# Patient Record
Sex: Male | Born: 1940 | Race: White | Hispanic: No | Marital: Married | Smoking: Former smoker
Health system: Southern US, Community
[De-identification: ages and names within clinical notes are randomized; demographics above are authoritative.]

## PROBLEM LIST (undated history)

## (undated) DIAGNOSIS — R9430 Abnormal result of cardiovascular function study, unspecified: Secondary | ICD-10-CM

## (undated) DIAGNOSIS — E785 Hyperlipidemia, unspecified: Secondary | ICD-10-CM

## (undated) DIAGNOSIS — I10 Essential (primary) hypertension: Secondary | ICD-10-CM

## (undated) DIAGNOSIS — I4891 Unspecified atrial fibrillation: Secondary | ICD-10-CM

## (undated) DIAGNOSIS — Q2111 Secundum atrial septal defect: Secondary | ICD-10-CM

## (undated) DIAGNOSIS — G8929 Other chronic pain: Secondary | ICD-10-CM

## (undated) DIAGNOSIS — R0789 Other chest pain: Secondary | ICD-10-CM

## (undated) DIAGNOSIS — I779 Disorder of arteries and arterioles, unspecified: Secondary | ICD-10-CM

## (undated) DIAGNOSIS — I350 Nonrheumatic aortic (valve) stenosis: Secondary | ICD-10-CM

## (undated) DIAGNOSIS — I679 Cerebrovascular disease, unspecified: Secondary | ICD-10-CM

## (undated) DIAGNOSIS — R200 Anesthesia of skin: Secondary | ICD-10-CM

## (undated) DIAGNOSIS — I739 Peripheral vascular disease, unspecified: Secondary | ICD-10-CM

## (undated) DIAGNOSIS — E119 Type 2 diabetes mellitus without complications: Secondary | ICD-10-CM

## (undated) DIAGNOSIS — Q211 Atrial septal defect: Secondary | ICD-10-CM

## (undated) DIAGNOSIS — I340 Nonrheumatic mitral (valve) insufficiency: Secondary | ICD-10-CM

## (undated) DIAGNOSIS — I2510 Atherosclerotic heart disease of native coronary artery without angina pectoris: Secondary | ICD-10-CM

## (undated) DIAGNOSIS — M549 Dorsalgia, unspecified: Secondary | ICD-10-CM

## (undated) DIAGNOSIS — R202 Paresthesia of skin: Secondary | ICD-10-CM

## (undated) DIAGNOSIS — Z952 Presence of prosthetic heart valve: Secondary | ICD-10-CM

## (undated) DIAGNOSIS — R269 Unspecified abnormalities of gait and mobility: Secondary | ICD-10-CM

## (undated) DIAGNOSIS — N200 Calculus of kidney: Secondary | ICD-10-CM

## (undated) DIAGNOSIS — Z951 Presence of aortocoronary bypass graft: Secondary | ICD-10-CM

## (undated) HISTORY — DX: Type 2 diabetes mellitus without complications: E11.9

## (undated) HISTORY — DX: Hyperlipidemia, unspecified: E78.5

## (undated) HISTORY — DX: Other chest pain: R07.89

## (undated) HISTORY — DX: Atrial septal defect: Q21.1

## (undated) HISTORY — DX: Essential (primary) hypertension: I10

## (undated) HISTORY — PX: NOSE SURGERY: SHX723

## (undated) HISTORY — DX: Atherosclerotic heart disease of native coronary artery without angina pectoris: I25.10

## (undated) HISTORY — DX: Cerebrovascular disease, unspecified: I67.9

## (undated) HISTORY — DX: Secundum atrial septal defect: Q21.11

## (undated) HISTORY — PX: VASECTOMY: SHX75

## (undated) HISTORY — DX: Disorder of arteries and arterioles, unspecified: I77.9

## (undated) HISTORY — DX: Presence of prosthetic heart valve: Z95.2

## (undated) HISTORY — DX: Nonrheumatic mitral (valve) insufficiency: I34.0

## (undated) HISTORY — DX: Presence of aortocoronary bypass graft: Z95.1

## (undated) HISTORY — DX: Abnormal result of cardiovascular function study, unspecified: R94.30

## (undated) HISTORY — DX: Peripheral vascular disease, unspecified: I73.9

## (undated) HISTORY — DX: Dorsalgia, unspecified: M54.9

## (undated) HISTORY — DX: Nonrheumatic aortic (valve) stenosis: I35.0

## (undated) HISTORY — DX: Unspecified atrial fibrillation: I48.91

## (undated) HISTORY — DX: Calculus of kidney: N20.0

## (undated) HISTORY — DX: Unspecified abnormalities of gait and mobility: R26.9

## (undated) HISTORY — PX: CHOLECYSTECTOMY: SHX55

## (undated) HISTORY — DX: Paresthesia of skin: R20.2

## (undated) HISTORY — DX: Anesthesia of skin: R20.0

## (undated) HISTORY — DX: Other chronic pain: G89.29

---

## 1998-11-26 ENCOUNTER — Encounter: Payer: Self-pay | Admitting: Cardiology

## 2000-09-04 HISTORY — PX: INGUINAL HERNIA REPAIR: SUR1180

## 2005-04-28 ENCOUNTER — Ambulatory Visit: Payer: Self-pay | Admitting: Cardiology

## 2005-05-09 ENCOUNTER — Ambulatory Visit: Payer: Self-pay | Admitting: Cardiology

## 2005-05-16 ENCOUNTER — Ambulatory Visit: Payer: Self-pay | Admitting: Cardiology

## 2005-05-25 ENCOUNTER — Ambulatory Visit: Payer: Self-pay | Admitting: Cardiology

## 2005-05-25 ENCOUNTER — Encounter: Payer: Self-pay | Admitting: Cardiology

## 2005-05-25 ENCOUNTER — Inpatient Hospital Stay (HOSPITAL_BASED_OUTPATIENT_CLINIC_OR_DEPARTMENT_OTHER): Admission: RE | Admit: 2005-05-25 | Discharge: 2005-05-25 | Payer: Self-pay | Admitting: Cardiology

## 2005-06-01 ENCOUNTER — Ambulatory Visit: Payer: Self-pay | Admitting: Cardiology

## 2005-06-02 ENCOUNTER — Encounter: Payer: Self-pay | Admitting: Cardiology

## 2005-06-02 ENCOUNTER — Ambulatory Visit: Payer: Self-pay | Admitting: Cardiology

## 2005-06-04 HISTORY — PX: AORTIC VALVE REPLACEMENT: SHX41

## 2005-06-04 HISTORY — PX: CORONARY ARTERY BYPASS GRAFT: SHX141

## 2005-06-08 ENCOUNTER — Encounter
Admission: RE | Admit: 2005-06-08 | Discharge: 2005-06-08 | Payer: Self-pay | Admitting: Thoracic Surgery (Cardiothoracic Vascular Surgery)

## 2005-06-09 ENCOUNTER — Ambulatory Visit: Payer: Self-pay | Admitting: Dentistry

## 2005-06-09 ENCOUNTER — Encounter
Admission: RE | Admit: 2005-06-09 | Discharge: 2005-06-09 | Payer: Self-pay | Admitting: Thoracic Surgery (Cardiothoracic Vascular Surgery)

## 2005-06-13 ENCOUNTER — Encounter: Payer: Self-pay | Admitting: Cardiology

## 2005-06-13 ENCOUNTER — Inpatient Hospital Stay (HOSPITAL_COMMUNITY)
Admission: RE | Admit: 2005-06-13 | Discharge: 2005-06-20 | Payer: Self-pay | Admitting: Thoracic Surgery (Cardiothoracic Vascular Surgery)

## 2005-06-13 ENCOUNTER — Encounter (INDEPENDENT_AMBULATORY_CARE_PROVIDER_SITE_OTHER): Payer: Self-pay | Admitting: Specialist

## 2005-06-20 ENCOUNTER — Encounter: Payer: Self-pay | Admitting: Cardiology

## 2005-06-22 ENCOUNTER — Ambulatory Visit: Payer: Self-pay | Admitting: Cardiology

## 2005-06-26 ENCOUNTER — Ambulatory Visit: Payer: Self-pay | Admitting: Cardiology

## 2005-06-27 ENCOUNTER — Ambulatory Visit: Payer: Self-pay | Admitting: Cardiology

## 2005-06-28 ENCOUNTER — Ambulatory Visit: Payer: Self-pay | Admitting: Cardiology

## 2005-06-29 ENCOUNTER — Ambulatory Visit: Payer: Self-pay | Admitting: Cardiology

## 2005-07-06 ENCOUNTER — Ambulatory Visit: Payer: Self-pay | Admitting: Cardiology

## 2005-07-10 ENCOUNTER — Encounter: Payer: Self-pay | Admitting: Cardiology

## 2005-07-10 ENCOUNTER — Inpatient Hospital Stay (HOSPITAL_COMMUNITY): Admission: EM | Admit: 2005-07-10 | Discharge: 2005-07-11 | Payer: Self-pay | Admitting: Emergency Medicine

## 2005-07-10 ENCOUNTER — Ambulatory Visit: Payer: Self-pay | Admitting: Cardiology

## 2005-07-11 ENCOUNTER — Encounter: Payer: Self-pay | Admitting: Cardiology

## 2005-07-20 ENCOUNTER — Ambulatory Visit: Payer: Self-pay | Admitting: Cardiology

## 2005-07-24 ENCOUNTER — Ambulatory Visit: Payer: Self-pay | Admitting: Cardiology

## 2005-07-31 ENCOUNTER — Ambulatory Visit: Payer: Self-pay | Admitting: Internal Medicine

## 2005-08-14 ENCOUNTER — Ambulatory Visit: Payer: Self-pay | Admitting: Internal Medicine

## 2005-08-25 ENCOUNTER — Ambulatory Visit: Payer: Self-pay | Admitting: Cardiology

## 2005-09-01 ENCOUNTER — Ambulatory Visit: Payer: Self-pay | Admitting: Cardiology

## 2005-09-11 ENCOUNTER — Ambulatory Visit: Payer: Self-pay | Admitting: Cardiology

## 2005-09-19 ENCOUNTER — Ambulatory Visit: Payer: Self-pay | Admitting: Internal Medicine

## 2005-09-26 ENCOUNTER — Ambulatory Visit: Payer: Self-pay | Admitting: Cardiology

## 2005-09-29 ENCOUNTER — Ambulatory Visit: Payer: Self-pay | Admitting: Cardiology

## 2005-10-03 ENCOUNTER — Ambulatory Visit: Payer: Self-pay | Admitting: Cardiology

## 2005-10-10 ENCOUNTER — Encounter: Payer: Self-pay | Admitting: Cardiology

## 2005-10-12 ENCOUNTER — Ambulatory Visit: Payer: Self-pay | Admitting: Cardiology

## 2005-10-18 ENCOUNTER — Encounter: Payer: Self-pay | Admitting: Cardiology

## 2005-10-18 ENCOUNTER — Ambulatory Visit: Payer: Self-pay | Admitting: Cardiology

## 2005-10-24 ENCOUNTER — Ambulatory Visit: Payer: Self-pay | Admitting: Cardiology

## 2006-02-07 ENCOUNTER — Ambulatory Visit: Payer: Self-pay | Admitting: Cardiology

## 2006-10-02 ENCOUNTER — Ambulatory Visit: Payer: Self-pay | Admitting: Cardiology

## 2006-10-11 ENCOUNTER — Encounter: Payer: Self-pay | Admitting: Physician Assistant

## 2006-10-11 ENCOUNTER — Ambulatory Visit: Payer: Self-pay | Admitting: Cardiology

## 2007-07-02 IMAGING — CT CT ANGIO CHEST
1 of 8 series · 12 of 32 positions shown · non-contrast
Comparison: None. 
Contrast:  120 cc Omnipaque 300

CLINICAL DATA: Aortic stenosis on heart catheterization [DATE] for assessment of ascending thoracic aortic size.  Preoperative for CABG aortic valve replacement 06/13/05. 
CT ANGIOGRAPHY OF CHEST:
TECHNIQUE: Multidetector CT imaging of the chest was performed during bolus injection of intravenous contrast.  Multiplanar CT angiographic image reconstructions were generated to evaluate the vascular anatomy.

[Series 6: recon 3: cta · axial · 0.74mm/px · z∈[-308,-17]mm · 12 of 281 slices shown]
[im 24/281  lung]
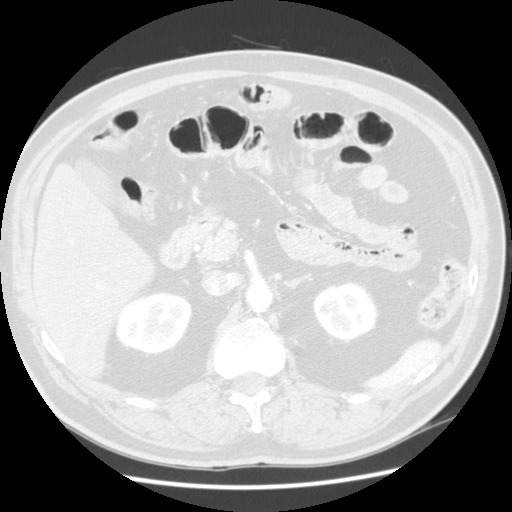
[im 47/281  mediastinal]
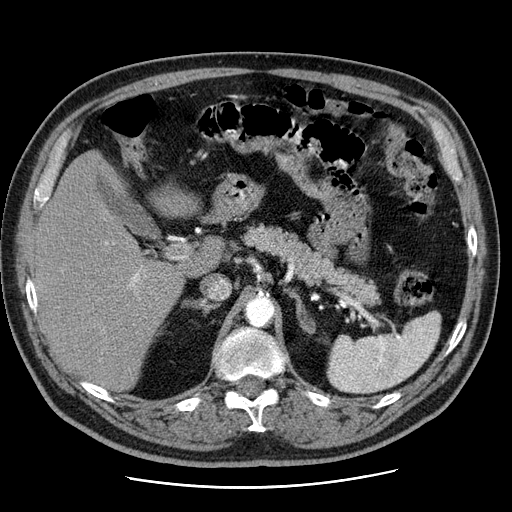
[im 71/281  lung]
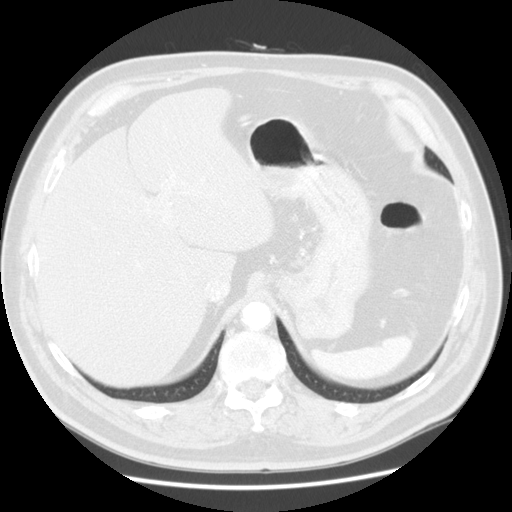
[im 94/281  mediastinal]
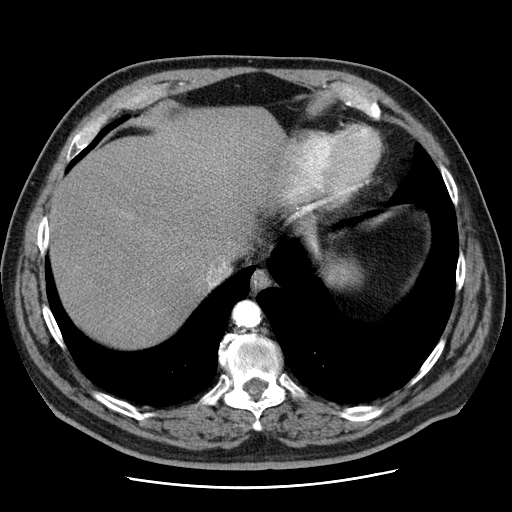
[im 117/281  lung]
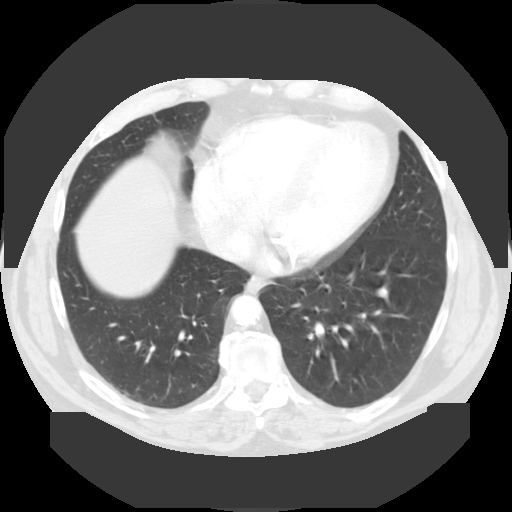
[im 141/281  mediastinal]
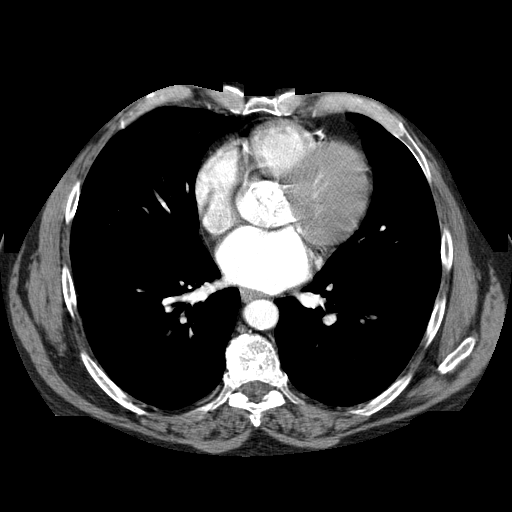
[im 154/281  lung]
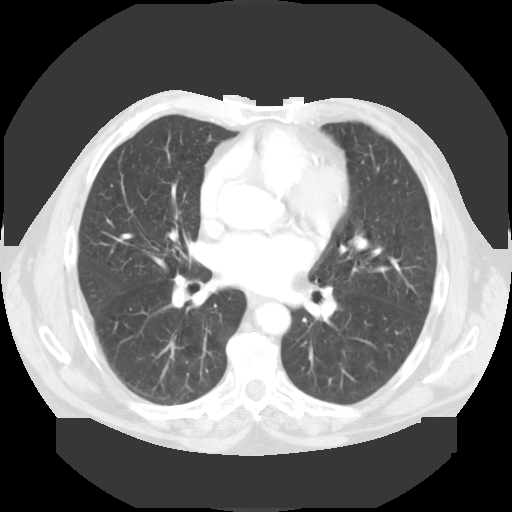
[im 164/281  mediastinal]
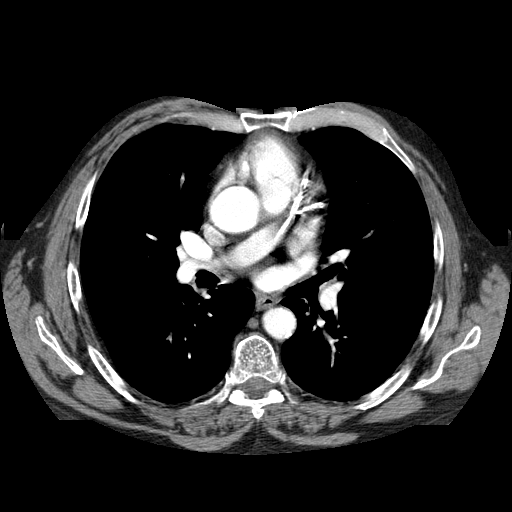
[im 187/281  lung]
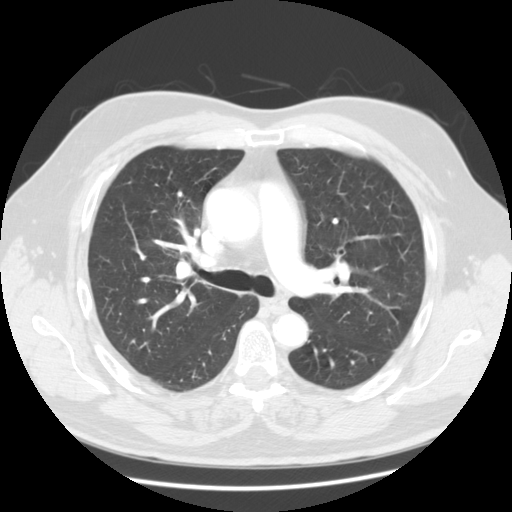
[im 211/281  mediastinal]
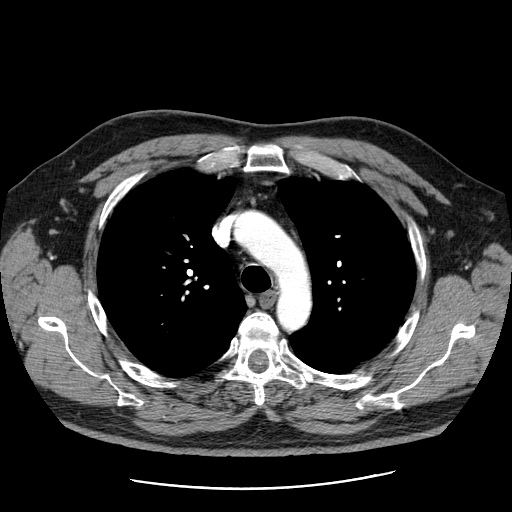
[im 234/281  lung]
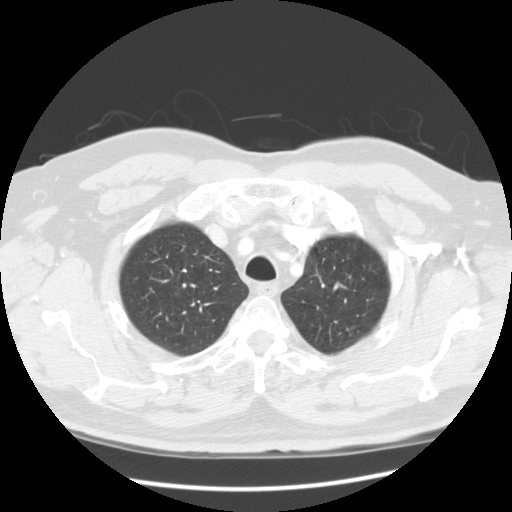
[im 257/281  mediastinal]
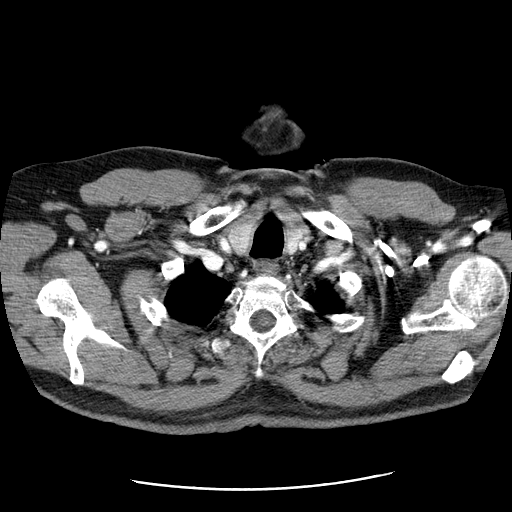

[12 of 32 positions shown; findings below may reference images not displayed]

FINDINGS: Calcification is seen at the aortic valve annulus.  Extensive coronary artery calcification is noted.  scending thoracic aorta measures 3.5 cm wide (image112/6).  Largest diameter of the ascending thoracic aorta at the level of the main pulmonary artery measures 4.1 cm AP x 3.7 cm wide (image 99/6).  Anterior 3.4 cm and posterior 2.5 cm wide thoracic aortic arch are seen.  Slight to moderate atheromatous vascular calcification/ectasia is seen with normal caliber descending and proximal abdominal aorta.  Aortic branch vessels are patent.  No mediastinal, hilar,  nor axillary mass/adenopathy is seen.  Heart size is normal with clear lungs.  There is left ventricular hypertrophy.  Upper abdominal organs demonstrate probable confluence of the lateral and posterior adrenal limbs rather than true right adrenal nodule and are otherwise unremarkable.
IMPRESSION: 1.  Aortic valve annular calcification with poststenotic dilatation of the ascending thoracic aorta as measured at the level of the main pulmonary artery measuring up to 4.1 x 3.7 cm.
2.  Calcified coronary artery disease.
3.  Left ventricular hypertrophy. 
4.  Otherwise negative.

## 2007-07-03 IMAGING — CR DG CHEST 2V
2 series · 2 of 2 positions shown · non-contrast
Comparison: none

CLINICAL DATA: Preop for coronary artery disease surgery. 
 CHEST ? 2 VIEW:

[view not recorded (1 of 2)]
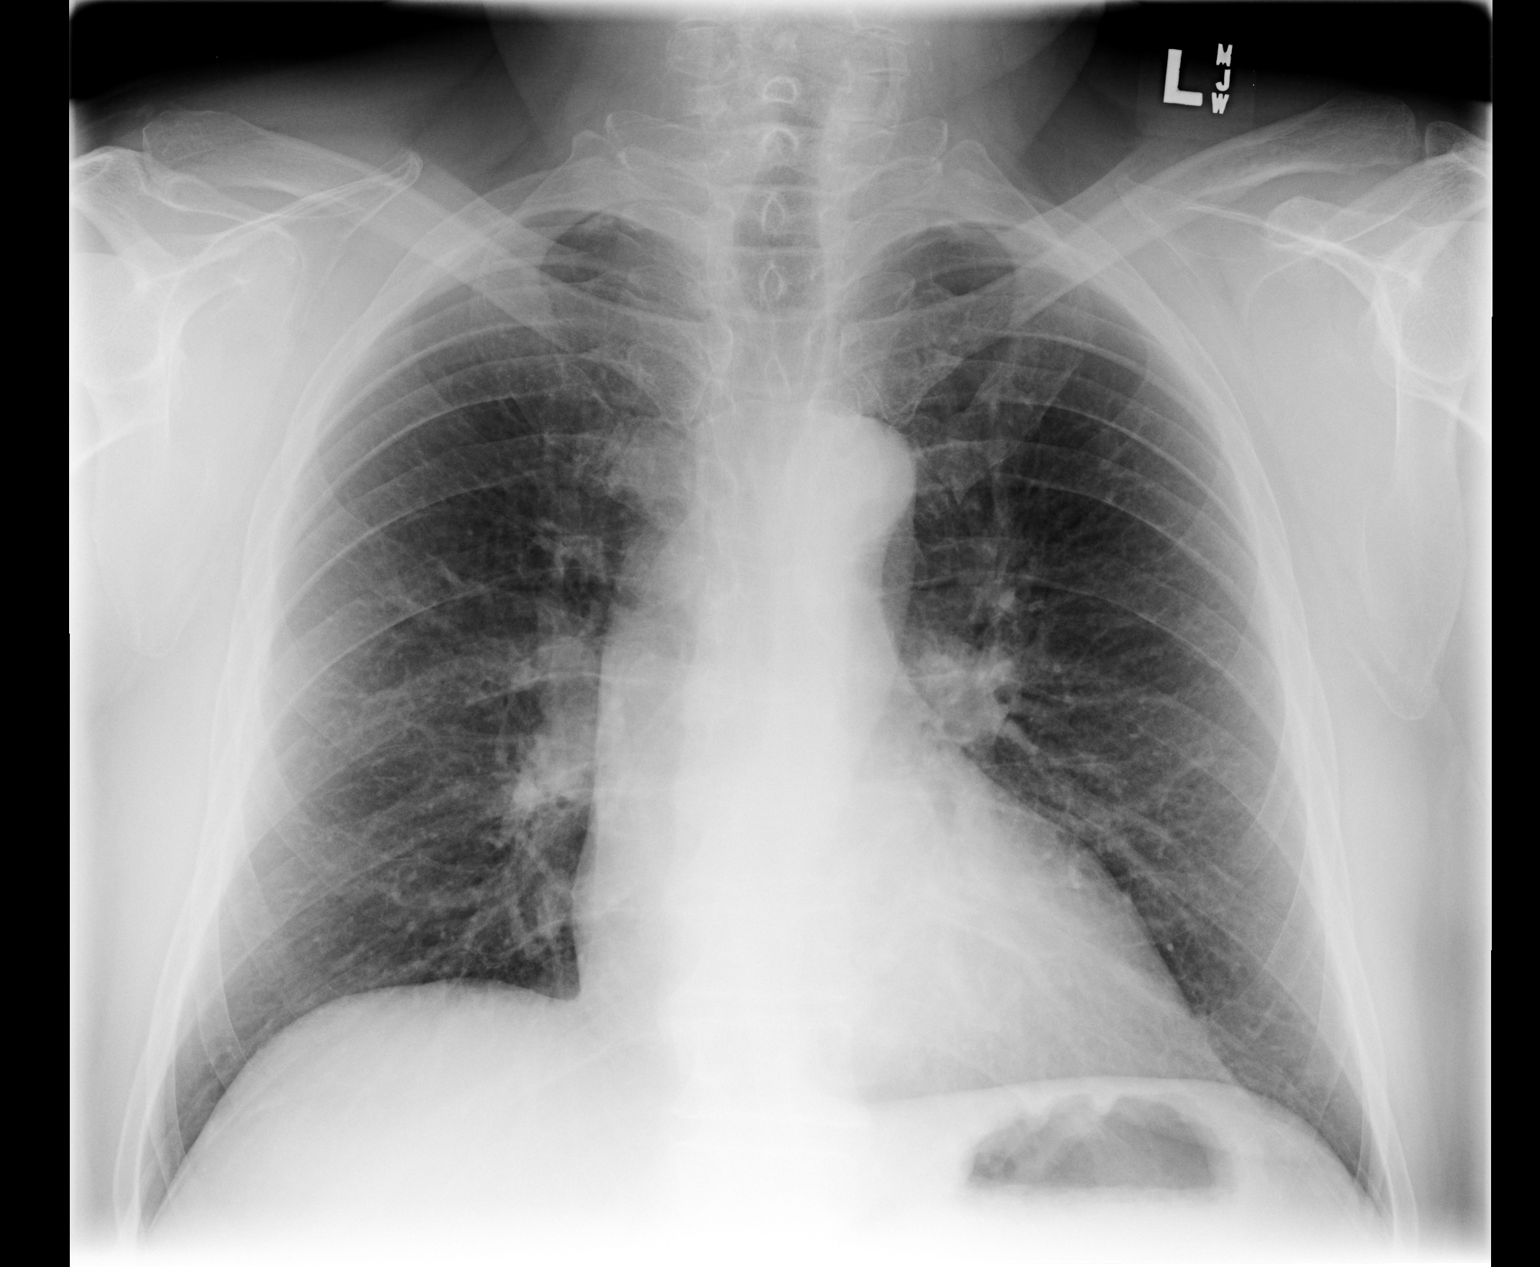

[view not recorded (2 of 2)]
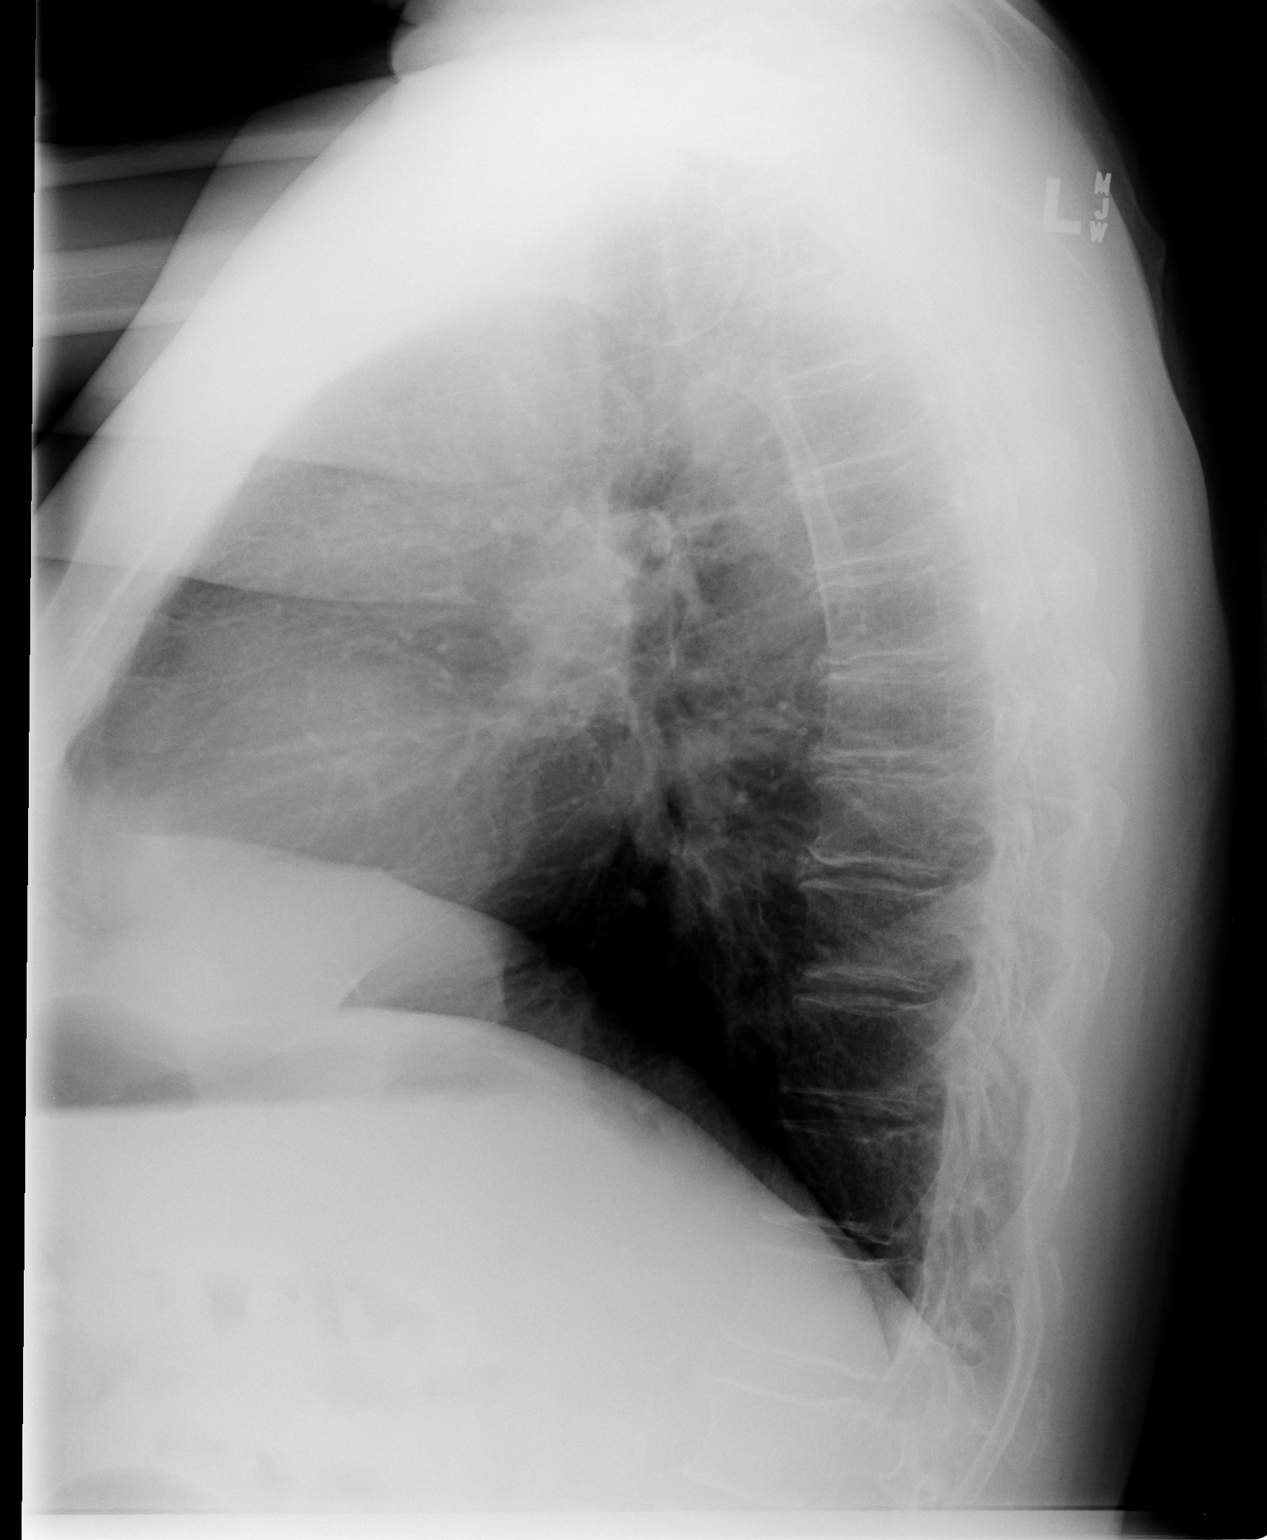

[2 of 2 positions shown; findings below may reference images not displayed]

FINDINGS: Two views of the chest show the lungs to be clear.  There is peribronchial thickening indicative of bronchitis.   The heart is mildly enlarged.
IMPRESSION: Bronchitis.  No pneumonia.  Mild cardiomegaly.

## 2007-09-25 ENCOUNTER — Encounter: Payer: Self-pay | Admitting: Cardiology

## 2008-06-08 ENCOUNTER — Ambulatory Visit: Payer: Self-pay | Admitting: Cardiology

## 2010-08-23 ENCOUNTER — Encounter: Payer: Self-pay | Admitting: Cardiology

## 2010-09-09 ENCOUNTER — Encounter: Payer: Self-pay | Admitting: Cardiology

## 2010-09-20 ENCOUNTER — Encounter: Payer: Self-pay | Admitting: Cardiology

## 2010-09-20 ENCOUNTER — Ambulatory Visit
Admission: RE | Admit: 2010-09-20 | Discharge: 2010-09-20 | Payer: Self-pay | Source: Home / Self Care | Attending: Cardiology | Admitting: Cardiology

## 2010-09-21 ENCOUNTER — Encounter: Payer: Self-pay | Admitting: Cardiology

## 2010-09-22 ENCOUNTER — Telehealth (INDEPENDENT_AMBULATORY_CARE_PROVIDER_SITE_OTHER): Payer: Self-pay | Admitting: *Deleted

## 2010-09-26 ENCOUNTER — Encounter: Payer: Self-pay | Admitting: Cardiology

## 2010-09-30 ENCOUNTER — Encounter (INDEPENDENT_AMBULATORY_CARE_PROVIDER_SITE_OTHER): Payer: Self-pay | Admitting: *Deleted

## 2010-10-06 NOTE — Letter (Signed)
Summary: External Correspondence/ [facility]  External Correspondence/ [facility]   Imported By: Dorise Hiss 09/27/2010 13:46:52  _____________________________________________________________________  External Attachment:    Type:   Image     Comment:   External Document

## 2010-10-06 NOTE — Letter (Signed)
Summary: Engineer, materials at [facility]  [address]   [place], [place] [address]   Phone: [phone]  Fax: [phone]        September 30, 2010 MRN: [id number]    MAGGIE DWORKIN [address] [place], [place]  [address]    Dear Mr. Nasworthy,  Your test ordered by Selena Batten has been reviewed by your physician (or physician assistant) and was found to be normal or stable. Your physician (or physician assistant) felt no changes were needed at this time.  _X___ Echocardiogram  ____ Cardiac Stress Test  ____ Lab Work  ____ Peripheral vascular study of arms, legs or neck  ____ CT scan or X-ray  ____ Lung or Breathing test  ____ Other:   Thank you.   Cyril Loosen, RN, BSN    Duane Boston, M.D., F.A.C.C. Thressa Sheller, M.D., F.A.C.C. Oneal Grout, M.D., F.A.C.C. Cheree Ditto, M.D., F.A.C.C. Daiva Nakayama, M.D., F.A.C.C. Kenney Houseman, M.D., F.A.C.C. Jeanne Ivan, PA-C

## 2010-10-06 NOTE — Progress Notes (Signed)
Summary: Office Visit/ [facility]  Office Visit/  Cardiology   Imported By: Dorise Hiss 09/20/2010 08:49:48  _____________________________________________________________________  External Attachment:    Type:   Image     Comment:   External Document

## 2010-10-06 NOTE — Letter (Signed)
Summary: Letter/ HANDICAPPED [facility]ACARD  Letter/ [facility]   Imported By: Dorise Hiss 09/20/2010 16:03:11  _____________________________________________________________________  External Attachment:    Type:   Image     Comment:   External Document

## 2010-10-06 NOTE — Letter (Signed)
Summary: [facility] - OFFICE NOTE  [facility] - OFFICE NOTE   Imported By: Claudette Laws 09/14/2010 13:06:15  _____________________________________________________________________  External Attachment:    Type:   Image     Comment:   External Document

## 2010-10-06 NOTE — Progress Notes (Signed)
Summary: Office Visit/ [facility]  Office Visit/ [facility]   Imported By: Dorise Hiss 09/20/2010 08:52:31  _____________________________________________________________________  External Attachment:    Type:   Image     Comment:   External Document

## 2010-10-06 NOTE — Progress Notes (Signed)
Summary: Office Visit/ [facility]  Office Visit/ [facility]   Imported By: Dorise Hiss 09/20/2010 08:55:10  _____________________________________________________________________  External Attachment:    Type:   Image     Comment:   External Document

## 2010-10-06 NOTE — Letter (Signed)
Summary: Discharge Summary/ [facility]  Discharge Summary/ [facility]   Imported By: Dorise Hiss 09/20/2010 08:56:54  _____________________________________________________________________  External Attachment:    Type:   Image     Comment:   External Document

## 2010-10-06 NOTE — Letter (Signed)
Summary: Discharge Summary  Discharge Summary   Imported By: Dorise Hiss 09/20/2010 09:14:39  _____________________________________________________________________  External Attachment:    Type:   Image     Comment:   External Document

## 2010-10-06 NOTE — Cardiovascular Report (Signed)
Summary: Cardiac Catheterization  Cardiac Catheterization   Imported By: Dorise Hiss 09/20/2010 08:43:51  _____________________________________________________________________  External Attachment:    Type:   Image     Comment:   External Document

## 2010-10-06 NOTE — Progress Notes (Signed)
Summary: Pt Questions Why Needs Echo  Phone Note Call from Patient Call back at Home Phone [phone]   Caller: Patient Summary of Call: Very confused about why we are scheduling him for a 2 D Echo. I have explained to him that Dr. Diona Browner felt he needed this test. He said well I am going to have to come up with another thousand dollars. I explained to him that he would have to talk to [facility] in regards to that. He has appointment with Dr. Myra Gianotti on February 6th @ 130pm. Patient was notified about this appt from Dr.Brabham's office. Initial call taken by: Zachary George,  September 22, 2010 5:11 PM  Follow-up for Phone Call        Per Dr. Ival Bible office note from 1/17, Echo is being completed for the following reason: "History of severe aortic stenosis status post bioprosthetic aortic valve replacement in 2006. His recent exercise echocardiogram did not interrogate function of the aortic prosthesis. A followup resting echocardiogram will be arranged to accomplish this."  Left message to call back on pt's voicemail to notify reason for echo. Cyril Loosen, RN, BSN  September 23, 2010 11:57 AM   Pt's wife notified and verbalized understanding. Pt had Echo yesterday (1/23) Follow-up by: Cyril Loosen, RN, BSN,  September 27, 2010 2:41 PM

## 2010-10-06 NOTE — Assessment & Plan Note (Signed)
Summary: NP-ABNORMAL STRESS TEST-REQUESTED   Visit Type:  New patient visit Primary Provider:  Dr. Selinda Flavin   History of Present Illness: 70 year old male, followed long term by Dr. Myrtis Ser, last seen in the office in January 2008. History is reviewed below. Records indicate that he saw Dr. Dimas Aguas for a routine visit in late December and a followup exercise echocardiogram was ordered with pending DOT physical, results reviewed below. He is referred today to discuss the results. I note that a carotid duplex was also obtained recently.  Recent lab work from December showed glucose 135, BUN 18, creatinine 0.9, sodium 137, potassium 4.5, AST 54, ALT 70, WBC 8.2, hemoglobin 14.7, platelets 252, cholesterol 261, triglycerides 192, HDL 45, LDL 178, hemoglobin A1c 6.9. Patient states that he was previously on Lipitor years ago, although it was discontinued in light of "abnormal liver tests." Based on these most recent labs, Dr. Dimas Aguas has started Lopid.  The patient denies any progressive angina, has stable NYHA class II dyspnea on exertion. He does report functional limitation related to hip pain and states that he gets short of breath when he "overdoes it" but this resolves fairly quickly with rest. He cannot recall his most recent resting echocardiographic study to followup aortic valve replacement.  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  We discussed both his stress test result and also the carotid Doppler result. Abnormal ST segment changes were noted during the exercise portion of the study, however in the absence of chest pain, and in the setting of LVH at baseline, reducing the specificity of this finding. More reassuring, was the fact that his echocardiographic images were normal. His carotid artery disease he seems to be significant, and we discussed a referral for vascular surgery consultation.  Preventive Screening-Counseling & Management  Alcohol-Tobacco     Smoking  Status: quit     Year Quit: 2006  Current Medications (verified): 1)  Oxycodone Hcl 5 Mg/35ml Soln (Oxycodone Hcl) .... As Needed 2)  Metoprolol Tartrate 25 Mg Tabs (Metoprolol Tartrate) .... Take 1 Tablet By Mouth Two Times A Day 3)  Amlodipine Besylate 10 Mg Tabs (Amlodipine Besylate) .... Take 1 Tablet By Mouth Once A Day 4)  Aspir-Low 81 Mg Tbec (Aspirin) .... Take 1 Tablet By Mouth Once A Day 5)  Hydrochlorothiazide 25 Mg Tabs (Hydrochlorothiazide) .... Take 1 Tablet By Mouth Once A Day 6)  Glipizide Xl 10 Mg Xr24h-Tab (Glipizide) .... Take 1 Tablet By Mouth Once A Day 7)  Lopid 600 Mg Tabs (Gemfibrozil) .... Take 1 Tablet By Mouth Two Times A Day 8)  Px Complete Senior Multivits  Tabs (Multiple Vitamins-Minerals) .... Take 1 Tablet By Mouth Once A Day  Allergies (verified): No Known Drug Allergies  Comments:  Nurse/Medical Assistant: The patient's medications and allergies were reviewed with the patient and were updated in the Medication and Allergy Lists. reviewed bottles w/ pt Tammi Romine CMA (October 19, 2010 1:22 PM)  Past History:  Family History: Last updated: 10/19/2010 Mother: died in her 41's with MI Father: died in his 70's of heart failure and COPD  Social History: Last updated: Oct 19, 2010 Has 3 grown children Married  Truck driver Tobacco Use - Former.  Alcohol Use - yes  Past Medical History: CAD - mulitvessel, normal LVEF Aortic stenosis - bovine pericardial tissue valve 10/06 Secundum atrial septal defect - closure 10/06 Hyperlipidemia. Postoperative atrial fibrillation - previously treated with amiodarone and Coumadin Hypertension Diabetes Type 2 Chronic pain - back  and sternal Nephrolithiasis  Past Surgical History: CABG -  LIMA to LAD, SVG to first diagonal, SVG to first obtuse marginal, SVG to PDA 10/06 Bovine pericardial tissue valve aortic position 10/06 Secundum ASD closure 10/06 Bilateral inguinal hernia repairs in 2002 Nasal  Surgery Vasectomy Cholecystectomy  Family History: Mother: died in her 57's with MI Father: died in his 13's of heart failure and COPD  Social History: Has 3 grown children Married  Naval architect Tobacco Use - Former.  Alcohol Use - yes Smoking Status:  quit  Review of Systems       The patient complains of dyspnea on exertion.  The patient denies anorexia, fever, weight loss, chest pain, syncope, peripheral edema, prolonged cough, headaches, abdominal pain, melena, hematochezia, and severe indigestion/heartburn.         Otherwise reviewed and negative.  Vital Signs:  Patient profile:   70 year old male Height:      70 inches Weight:      225 pounds BMI:     32.40 Pulse rate:   58 / minute BP sitting:   127 / 65  (left arm) Cuff size:   large  Vitals Entered By: Fuller Plan CMA (September 20, 2010 1:22 PM)  Physical Exam  Additional Exam:  Obese male in no acute distress. HEENT: Conjunctiva and lids normal, oropharynx with moist mucosa. Neck: Supple, no elevated JVP, soft bilateral carotid bruits, no thyromegaly. Lungs: Clear to auscultation, diminished breath sounds, nonlabored. Cardiac: Regular rate and rhythm with 2-3/6 systolic murmur consistent with aortic prosthesis, no diastolic murmur or pericardial rub, no S3. Abdomen: Soft, nontender, bowel sounds present. Extremities: Trace ankle edema and mild stasis, distal pulses one plus. Musculoskeletal: No kyphosis. Skin: Warm and dry. Neuropsychiatric: Alert and oriented x3, affect appropriate.   Stress Echocardiogram  Procedure date:  09/09/2010  Findings:      Exercise echocardiogram, reached stage 4, 88% maximum predicted heart rate, 7 METS, abnormal ST segment changes noted in the setting of LVH, hypertensive blood pressure response. Echocardiographic images revealed no evidence of ischemia. Bioprosthetic aortic valve noted but not assessed.  Carotid Doppler  Procedure date:  09/09/2010  Findings:       50-69% stenosis RICA, 70-90% stenosis LICA.  EKG  Procedure date:  09/20/2010  Findings:      Sinus rhythm at 61 beats per minutes with inferior Q waves suggestive of previous infarct, LVH, nonspecific ST T changes.  Impression & Recommendations:  Problem # 1:  CARDIOVASCULAR FUNCTION STUDY, ABNORMAL (ICD-794.30)  Recent routine exercise echocardiogram report reviewed. This was done as part of a routine DOT evaluation by report. Mr. Rauls denies any progressive angina or shortness of breath over baseline. As noted in the final report, ST segment abnormalities were observed in the absence of chest pain and in the setting of LVH at baseline, reducing the specificity of this finding overall. It was reassuring to note that concurrent echocardiographic images were normal, arguing against focal ischemia. Based on this, would recommend continued medical therapy and no further diagnostic cardiac testing unless his symptom profile changes. Would like to arrange more regular followup with cardiology, and visits with Dr. Myrtis Ser will be scheduled going forward.  Problem # 2:  CAROTID ARTERY DISEASE (ICD-433.10)  Recently documented by carotid duplex showing 50-69% stenosis on the right, and 70-90% stenosis on the left. I discussed this with the patient. Dr. Dimas Aguas and already indicating referring him for vascular surgery consultation. We will make these arrangements.  His updated medication  list for this problem includes:    Aspir-low 81 Mg Tbec (Aspirin) .Marland Kitchen... Take 1 tablet by mouth once a day  Problem # 3:  DIABETES MELLITUS, TYPE II (ICD-250.00)  Followed by Dr. Dimas Aguas.  His updated medication list for this problem includes:    Aspir-low 81 Mg Tbec (Aspirin) .Marland Kitchen... Take 1 tablet by mouth once a day    Glipizide Xl 10 Mg Xr24h-tab (Glipizide) .Marland Kitchen... Take 1 tablet by mouth once a day  Problem # 4:  NONSPECIFIC ABNORMAL RESULTS LIVR FUNCTION STUDY (ICD-794.8)  Etiology not certain based on available  information. Patient reports a prior history of abnormal liver testing, resulting in discontinuation of Lipitor years ago. He has not been on statin therapy recently. AST and ALT are noted above. This is been followed by Dr. Dimas Aguas.  Problem # 5:  ESSENTIAL HYPERTENSION, BENIGN (ICD-401.1)  Blood pressure is reasonable today.  His updated medication list for this problem includes:    Metoprolol Tartrate 25 Mg Tabs (Metoprolol tartrate) .Marland Kitchen... Take 1 tablet by mouth two times a day    Amlodipine Besylate 10 Mg Tabs (Amlodipine besylate) .Marland Kitchen... Take 1 tablet by mouth once a day    Aspir-low 81 Mg Tbec (Aspirin) .Marland Kitchen... Take 1 tablet by mouth once a day    Hydrochlorothiazide 25 Mg Tabs (Hydrochlorothiazide) .Marland Kitchen... Take 1 tablet by mouth once a day  Problem # 6:  AORTIC VALVE DISORDERS (ICD-424.1)  History of severe aortic stenosis status post bioprosthetic aortic valve replacement in 2006. His recent exercise echocardiogram did not interrogate function of the aortic prosthesis. A followup resting echocardiogram will be arranged to accomplish this.  His updated medication list for this problem includes:    Metoprolol Tartrate 25 Mg Tabs (Metoprolol tartrate) .Marland Kitchen... Take 1 tablet by mouth two times a day    Hydrochlorothiazide 25 Mg Tabs (Hydrochlorothiazide) .Marland Kitchen... Take 1 tablet by mouth once a day  Orders: EKG w/ Interpretation (93000)  Problem # 7:  CORONARY ATHEROSCLEROSIS NATIVE CORONARY ARTERY (ICD-414.01)  Status post coronary artery bypass grafting with concurrent aortic valve replacement and closure of secundum ASD in 2006. No reported progressive angina. Continue medical therapy at this point. Regular followup with Dr. Myrtis Ser will be arranged.  His updated medication list for this problem includes:    Metoprolol Tartrate 25 Mg Tabs (Metoprolol tartrate) .Marland Kitchen... Take 1 tablet by mouth two times a day    Amlodipine Besylate 10 Mg Tabs (Amlodipine besylate) .Marland Kitchen... Take 1 tablet by mouth once a  day    Aspir-low 81 Mg Tbec (Aspirin) .Marland Kitchen... Take 1 tablet by mouth once a day  Other Orders: 2-D Echocardiogram (2D Echo) [facility] Referral ([facility] Ref)  Patient Instructions: 1)  Your physician recommends that you schedule a follow-up appointment in: MARCH 5TH 2012 @1 :30PM(MONDAY) with Dr. Myrtis Ser. 2)  Your physician has requested that you have an echocardiogram.  Echocardiography is a painless test that uses sound waves to create images of your heart. It provides your doctor with information about the size and shape of your heart and how well your heart's chambers and valves are working.  This procedure takes approximately one hour. There are no restrictions for this procedure. 3)  You have been referred to [facility] [place]. Their office will contact you directly with the appointment. 4)  Your physician recommends that you continue on your current medications as directed. Please refer to the Current Medication list given to you today.

## 2010-10-06 NOTE — Op Note (Signed)
Summary: Operative Report  Operative Report   Imported By: Dorise Hiss 09/20/2010 08:47:06  _____________________________________________________________________  External Attachment:    Type:   Image     Comment:   External Document

## 2010-10-06 NOTE — Progress Notes (Signed)
Summary: Office Visit/ [facility]  Office Visit/ [facility]   Imported By: Dorise Hiss 09/20/2010 08:53:50  _____________________________________________________________________  External Attachment:    Type:   Image     Comment:   External Document

## 2010-10-07 ENCOUNTER — Other Ambulatory Visit (INDEPENDENT_AMBULATORY_CARE_PROVIDER_SITE_OTHER): Payer: PRIVATE HEALTH INSURANCE

## 2010-10-07 ENCOUNTER — Encounter: Payer: Self-pay | Admitting: Cardiology

## 2010-10-07 ENCOUNTER — Encounter (INDEPENDENT_AMBULATORY_CARE_PROVIDER_SITE_OTHER): Payer: MEDICARE | Admitting: Vascular Surgery

## 2010-10-07 DIAGNOSIS — I6529 Occlusion and stenosis of unspecified carotid artery: Secondary | ICD-10-CM

## 2010-10-14 NOTE — Assessment & Plan Note (Addendum)
OFFICE VISIT  Justin Valencia, Justin Valencia DOB:  Aug 03, 1941                                       10/07/2010 CHART#:[id number]  PRIMARY CARE PHYSICIAN:  Dr. Selinda Flavin.  REASON FOR CONSULTATION:  Bilateral internal carotid stenosis.  HISTORY OF PRESENT ILLNESS:  This is a 70 year old gentleman with no prior history of stroke or TIA who presents with chief complaint of internal carotid artery stenosis found on screening examination.  As part of his routine care, he had a bilateral internal carotid artery duplex completed.  It demonstrated on the right side 50% to 69% blockage, on the left 70% to 90%, and he was subsequently referred to vascular surgery consultation.  The patient notes no prior history of any TIA or stroke symptoms.  Specifically, he denies any amaurosis fugax.  No monocular blindness.  He denies any facial drooping or any hemiplegia.  He denies any receptive or expressive aphasia.  His carotid artery risk factors include diabetes, hyperlipidemia, hypertension.  In terms of his risk factor management, he is currently on antihypertensives, aspirin and glipizide for his diabetes and it looks like some Lopid for his hyperlipidemia, as he has apparently some fatty liver which interferes with his ability to use statins.  PAST MEDICAL HISTORY:  Diabetes, coronary artery disease, history of aortic stenosis, hyperlipidemia, atrial fibrillation, hypertension, lumbago, nephrolithiasis, NASH.  PAST SURGICAL HISTORY:  Atrial septal defect repair and an aortic valve repair.  He has undergone a CABG, bilateral inguinal hernia repairs, some type of nasal surgery, a vasectomy and a cholecystectomy.  SOCIAL HISTORY:  He smoked, 20-pack-year history, quit in 2006.  He denies any alcohol or illicit drug use.  FAMILY HISTORY:  Mother died in the 55s with an MI.  Father died in the 53s with COPD and heart failure.  MEDICATIONS:  Oxycodone, metoprolol, amlodipine,  aspirin, hydrochlorothiazide, glipizide, Lopid, multivitamin.  ALLERGIES:  No known drug allergies.  REVIEW OF SYSTEMS:  Today he noted change in hearing, arthritis joint pain, chest tightness/pressure, heart murmur.  Otherwise the rest of his review of systems was marked as negative.  PHYSICAL EXAMINATION:  Vital signs:  Blood pressure 142/69 in right arm, on the left side is 124/68, heart rate of 76, respirations were 12. General examination:  Well-developed, well-nourished, no apparent distress.  He was alert and oriented x3. Head:  Normocephalic, atraumatic. ENT: Oropharynx with erythema without any exudate.  The  nares without any erythema or drainage.  Hearing was grossly decreased bilaterally, right greater than left. Eyes: Pupils were equal, round, reactive to light.  Extraocular movements were intact. Neck:  Supple without any nuchal rigidity. Pulmonary:  Symmetric expansion, good air movement.  No rales, rhonchi or wheezing. Cardiac:  Regular rate and rhythm.  Normal S1-S2.  There were murmurs in the aortic listening area consistent with his AVR. Vascular:  Palpable pulses throughout upper and lower extremities bilaterally.  There were carotid bruits.  I could not auscultate this patient's abdominal aorta due to his obesity. Abdomen:  Soft abdomen, nontender, nondistended, mildly obese.  No obvious masses.  He did have some hepatomegaly, easily palpable left lobe of his liver in the midline; however, I did not note any masses elsewhere. Musculoskeletal:  He had 5/5 strength throughout.  In his lower extremities he has some dermatitis changes consistent with probable lipodermatosclerosis related to vein harvest for his CABG;  however, he has no evidence of any ulcers or gangrene on either feet. Neuro:  Cranial nerves II-XII were intact.  His motor strength was as listed above.  Sensation was grossly intact in all extremities. Psych: Judgement is intact.  Mood and affect  are appropriate for his clinical situation. Skin:  Extremities are as listed above.  In his chest, there seemed to these healed white punctate lesions which he says were consistent with a previous outbreak of shingles. Lymphatic:  There is no cervical, axillary or inguinal lymphadenopathy.  I reviewed about 15 pages of outside documentation including some outside carotid duplexes.  Unfortunately they do not have end diastolic velocities listed, so it is hard to interpret exactly the readings.  We repeated the right side which gave a systolic velocity of 243 with an end diastolic of 39.  This would put it at a 40% to 59% stenosis versus the outside read at 70% to 90%, the difference being the presence of end diastolic velocities.  The right side we did not repeat as it was only in the 50% to 69% category and for a symptomatic the patient there would be no indication for any type of intervention even under ACAS data.  MEDICAL DECISION MAKING:  This is a 70 year old gentleman with asymptomatic bilateral internal carotid artery stenosis.  Based on his degree of stenosis, I would repeat his bilateral carotid duplexes every 6 months.  If he at any point crosses over the threshold of 80% or has a stroke or TIA, at that point we have indication to proceeding with a carotid endarterectomy according to ACAS data, but at this point there is no advantage to proceeding.  We will continue to arrange for q.6 month surveillance and intervene as needed.  I appreciate being given the opportunity to participate in this patient's care.    Fransisco Hertz, MD Electronically Signed  BLC/MEDQ  D:  10/07/2010  T:  10/10/2010  Job:  [id number]  cc:   Lyndle Herrlich, MD

## 2010-10-26 NOTE — Progress Notes (Signed)
Summary: [facility]  [place]: Office Visit  [facility]  [place]: Office Visit   Imported By: Earl Many 10/18/2010 16:39:20  _____________________________________________________________________  External Attachment:    Type:   Image     Comment:   External Document

## 2010-10-26 NOTE — Progress Notes (Signed)
Summary: [facility] [place] [facility]: Office Visit  [facility] : Office Visit   Imported By: Earl Many 10/18/2010 17:39:16  _____________________________________________________________________  External Attachment:    Type:   Image     Comment:   External Document

## 2010-11-07 ENCOUNTER — Encounter: Payer: Self-pay | Admitting: Cardiology

## 2010-11-07 ENCOUNTER — Ambulatory Visit (INDEPENDENT_AMBULATORY_CARE_PROVIDER_SITE_OTHER): Payer: MEDICARE | Admitting: Cardiology

## 2010-11-07 DIAGNOSIS — R072 Precordial pain: Secondary | ICD-10-CM

## 2010-11-15 NOTE — Assessment & Plan Note (Signed)
Summary: 1 MO F/U PER [facility]   Visit Type:  Follow-up Primary Provider:  Dr. Selinda Flavin  CC:  aortic valve replacement.  History of Present Illness: The patient is seen for followup aortic valve replacement.  He had some vague chest pain.  He had a stress echo in January, 2012.  There was no ischemia.  At the time of that study the patient did not have full evaluation of his aortic prosthesis.  He therefore had a followup 2-D echo.  This study was done later in January, 2012.  Ejection fraction was 60%.  The aortic tissue prosthesis was working well.  There is normal function.  Since that time he is doing well.  He has some very vague chest discomfort that sounds musculoskeletal.  Preventive Screening-Counseling & Management  Alcohol-Tobacco     Smoking Status: quit     Year Quit: 2006  Current Medications (verified): 1)  Oxycodone Hcl 5 Mg/49ml Soln (Oxycodone Hcl) .... As Needed 2)  Metoprolol Tartrate 25 Mg Tabs (Metoprolol Tartrate) .... Take 1 Tablet By Mouth Two Times A Day 3)  Amlodipine Besylate 10 Mg Tabs (Amlodipine Besylate) .... Take 1 Tablet By Mouth Once A Day 4)  Aspir-Low 81 Mg Tbec (Aspirin) .... Take 1 Tablet By Mouth Once A Day 5)  Hydrochlorothiazide 25 Mg Tabs (Hydrochlorothiazide) .... Take 1 Tablet By Mouth Once A Day 6)  Glipizide Xl 10 Mg Xr24h-Tab (Glipizide) .... Take 1 Tablet By Mouth Once A Day 7)  Lopid 600 Mg Tabs (Gemfibrozil) .... Take 1 Tablet By Mouth Two Times A Day 8)  Px Complete Senior Multivits  Tabs (Multiple Vitamins-Minerals) .... Take 1 Tablet By Mouth Once A Day  Allergies (verified): No Known Drug Allergies  Comments:  Nurse/Medical Assistant: The patient's medication bottles and allergies were reviewed with the patient and were updated in the Medication and Allergy Lists.  Past History:  Past Medical History: CAD - mulitvessel,  /  stress echo... January, 2012... no ischemia a EF   normal /   EF 60-65%... echo... January,  2012 Mitral regurgitation   mild... echo... January, 2012 Aortic stenosis - bovine pericardial tissue valve 10/06 Aortic valve replacement    bovine pericardial tissue valve... October, 2006  /   normal valve function... echo... January, 2012 Secundum atrial septal defect - closure 10/06 Hyperlipidemia.   abnormal LFTs in the past... Lipitor discontinued in the past... followed by Dr. Dimas Aguas Postoperative atrial fibrillation - previously treated with amiodarone and Coumadin Hypertension Diabetes Type 2 Chronic pain - back and sternal Nephrolithiasis Carotid artery disease   bilateral... followed closely Dr. Imogene Burn  ([facility])  February, 2012  Review of Systems       Patient denies fever, chills, headache, sweats, rash, change in vision, change in hearing, chest pain cough, nausea vomiting, urinary symptoms.  All other systems are reviewed and are negative.  Vital Signs:  Patient profile:   70 year old male Height:      70 inches Weight:      226 pounds Pulse rate:   60 / minute BP sitting:   141 / 69  (left arm) Cuff size:   large  Vitals Entered By: Carlye Grippe (November 07, 2010 1:42 PM)  Physical Exam  General:  patient is stable. Eyes:  no xanthelasma. Neck:  no jugular venous distention. Lungs:  lungs are clear.  Respiratory effort is nonlabored. Heart:  cardiac exam reveals S1 and S2.  There is a systolic murmur related to his  aortic prosthesis. Abdomen:  abdomen soft. Extremities:  no peripheral edema. Psych:  patient is oriented to person time and place.  Affect is normal.   Impression & Recommendations:  Problem # 1:  CAROTID ARTERY DISEASE (ICD-433.10)  His updated medication list for this problem includes:    Aspir-low 81 Mg Tbec (Aspirin) .Marland Kitchen... Take 1 tablet by mouth once a day The patient has carotid artery disease date all her very carefully by the surgeons.  He is to have a 6 month followup study.   Problem # 2:  ESSENTIAL HYPERTENSION, BENIGN  (ICD-401.1) Blood pressure is controlled.  No change in therapy.  Problem # 3:  CORONARY ATHEROSCLEROSIS NATIVE CORONARY ARTERY (ICD-414.01) Coronary disease is stable.  His stress echo showed no ischemia.  No further workup.  Problem # 4:  AORTIC VALVE DISORDERS (ICD-424.1) The echo shows that his aortic valve prosthesis is working well.  No further workup  I will see him back in 6 months.  I tried to reassure him that his current chest discomfort is noncardiac in origin.  Full activities.  Patient Instructions: 1)  Your physician wants you to follow-up in: 6 months. You will receive a reminder letter in the mail one-two months in advance. If you don't receive a letter, please call our office to schedule the follow-up appointment. 2)  Your physician recommends that you continue on your current medications as directed. Please refer to the Current Medication list given to you today.

## 2010-11-15 NOTE — Miscellaneous (Signed)
  Clinical Lists Changes  Observations: Added new observation of PAST MED HX: CAD - mulitvessel,  /  stress echo... January, 2012... no ischemia a EF   normal /   EF 60-65%... echo... January, 2012 Mitral regurgitation   mild... echo... January, 2012 Aortic stenosis - bovine pericardial tissue valve 10/06 Aortic valve replacement    bovine pericardial tissue valve... October, 2006  /   normal valve function... echo... January, 2012 Secundum atrial septal defect - closure 10/06 Hyperlipidemia.   abnormal LFTs in the past... Lipitor discontinued in the past... followed by Dr. Dimas Aguas Postoperative atrial fibrillation - previously treated with amiodarone and Coumadin Hypertension Diabetes Type 2 Chronic pain - back and sternal Nephrolithiasis Carotid artery disease   bilateral... followed closely Dr. Imogene Burn  ([facility]) (11/07/2010 9:05) Added new observation of PRIMARY MD: Dr. Selinda Flavin (11/07/2010 9:05)       Past History:  Past Medical History: CAD - mulitvessel,  /  stress echo... January, 2012... no ischemia a EF   normal /   EF 60-65%... echo... January, 2012 Mitral regurgitation   mild... echo... January, 2012 Aortic stenosis - bovine pericardial tissue valve 10/06 Aortic valve replacement    bovine pericardial tissue valve... October, 2006  /   normal valve function... echo... January, 2012 Secundum atrial septal defect - closure 10/06 Hyperlipidemia.   abnormal LFTs in the past... Lipitor discontinued in the past... followed by Dr. Dimas Aguas Postoperative atrial fibrillation - previously treated with amiodarone and Coumadin Hypertension Diabetes Type 2 Chronic pain - back and sternal Nephrolithiasis Carotid artery disease   bilateral... followed closely Dr. Imogene Burn  ([facility])

## 2011-01-20 NOTE — Assessment & Plan Note (Signed)
[facility]                          [facility] OFFICE NOTE   PALMER, FAHRNER                       MRN:          [id number]  DATE:10/02/2006                            DOB:          1941-07-15    PRIMARY CARDIOLOGIST:  Dr. Willa Rough.   REASON FOR VISIT:  Scheduled 74-month followup.   Since last seen here in the clinic in June 2007 by Dr. Myrtis Ser, the patient  continues to do well from a clinical standpoint with no interim  development of signs or symptoms worrisome for unstable angina pectoris  or congestive heart failure.   The patient does, however, continue to experience intermittent chest  discomfort which is exacerbated by movement of the chest as well as by a  strong cough or sneezing.  There is some pleuritic component as well.  This has been essentially unchanged since he underwent surgery in  October 2006.   The patient also reports no tachypalpitations.   Electrocardiogram today reveals NSR with first-degree atrial ventricular  block at 78 bmp and chronic T-wave inversion in leads #1 and AVL,  unchanged from previous study.   CURRENT MEDICATIONS:  1. Aspirin 81 mg daily.  2. Lipitor 10 daily.  3. Hydrochlorothiazide 25 daily.  4. Lisinopril 20 daily.   PHYSICAL EXAMINATION:  Blood pressure 100/60.  Pulse 88, regular.  Weight 218.  GENERAL:  A 70 year old male, sitting upright in no distress.  HEENT:  Normocephalic, atraumatic.  NECK:  Palpable bilateral carotid pulses without bruits, symmetrical, no  JVD.  LUNGS:  Clear to auscultation in all fields.  HEART:  Regular rate and rhythm (S1, S2).  Harsh grade 3/6 systolic  ejection murmur heard loudest at the base; no diastolic blow.  ABDOMEN:  Soft, nontender.  EXTREMITIES:  Palpable pulses with no significant edema.  NEURO:  No focal deficit.   REVIEW OF SYSTEMS:  As noted per HPI, remaining systems negative.   IMPRESSION:  1. Coronary artery disease.      a.      Status post 4-vessel coronary artery bypass graft, October       2006:  Left internal mammary artery - left anterior descending;       saphenous vein graft - first diagonal; saphenous vein graft -       obtuse marginal 1 branch; saphenous vein graft - posterior       descending artery.  2. History of severe aortic stenosis.      a.     Status post bovine pericardial tissue aortic valve       replacement, October 2006.  3. History of secundum type atrial septal defect.      a.     Status post closure, October 2006.  4. Normal left ventricular function.  5. Hyperlipidemia.      a.     Followed by Dr. Dimas Aguas.  6. History of postoperative atrial fibrillation.      a.     Maintaining a normal sinus rhythm.      b.     Previously treated with amiodarone and Coumadin.  7.  History of hypertension.   PLAN:  1. Repeat 2-D echocardiogram for reassessment of bioprosthetic aortic      valve.  When last studied in February 2007, there was no evidence      of perivalvular leak or mitral regurgitation but chordal SAM was      noted.  We will, therefore, reassess this in light of the current      harsh systolic murmur.  2. Continue current medication regimen.  3. Schedule a return clinic followup with myself and Dr. Myrtis Ser in 6      months.  4. Of note, the patient did request a refill for his oxycodone      medication which he uses for his musculoskeletal chest pain.      However, at this point in time, we are hesitant to assist in      management of this with oxycodone but have given him a short-term      supply of Darvocet.  I also advised the patient to confer with Dr.      Dimas Aguas in the future for additional recommendations regarding      medical management of his musculoskeletal chest pain.      Rozell Searing, PA-C  Electronically Signed      Luis Abed, MD, [facility]  Electronically Signed   GS/MedQ  DD: 10/02/2006  DT: 10/02/2006  Job #: [id number]   cc:   Selinda Flavin

## 2011-01-20 NOTE — H&P (Signed)
Justin Valencia, Justin Valencia                ACCOUNT NO.:  [id number]   MEDICAL RECORD NO.:  [id number]          PATIENT TYPE:  INP   LOCATION:  2038                         FACILITY:  [facility]   PHYSICIAN:  Jesse Sans. Wall, M.D.   DATE OF BIRTH:  Apr 06, 1941   DATE OF ADMISSION:  07/10/2005  DATE OF DISCHARGE:                                HISTORY & PHYSICAL   PRIMARY CARDIOLOGIST:  Willa Rough, M.D.   PATIENT PROFILE:  This is a 70 year old white male with history of CAV,  severe AS, and ASV, status post CABG, AVR, and AFV closure, who presents  with atrial fibrillation.   PROBLEM LIST:  1.  Atrial fibrillation with rapid ventricular response.  2.  SVAD, status post CABG x4, LIMA to LAD, vein graft to D1, vein graft to      OM-1, vein graft to PDA by Dr. Cornelius Moras on June 13, 2005.  3.  Severe aortic stenosis, status post AVR with bovine pericardial tissue      valve, June 13, 2005.  4.  Secundum ANV, status post ASV closure, June 13, 2005.  5.  Hypertension.  6.  Hyperlipidemia.  7.  Remote tobacco abuse, previously a third to a pack a day times      approximately 45 years.  8.  Nephrolithiasis.  9.  Inguinal hernia, status post repair three years ago.   HISTORY OF PRESENT ILLNESS:  This is a 70 year old white male with history  of CAD, status post CABG x4, AVR, and AFV closure, on June 13, 2005.  Postoperative course was complicated by atrial fibrillation and treated with  amiodarone and Coumadin.  He was discharged on June 20, 2005, and noted  that day at the pharmacy that his heart rate was elevated in the 130s to  150s.  He says it has been that way ever since.  Other than the sensation of  tachycardia, mild dyspnea on exertion, he has done relatively well.  He saw  Dr. Cornelius Moras today, who after seeing his heart rate in the 130s, consulted Dr.  Myrtis Ser, and decision was made to bring him into the ED for cardioversion.  His  INR has been therapeutic dating back to June 22, 2005, when it was 3.  Subsequently on October 24 it was 8, October 25 4.7, October 26, 3, and  November 2, 2.5.  The patient currently denies any chest pain or shortness  of breath.   ALLERGIES:  No known drug allergies.   CURRENT MEDICATIONS:  1.  Coumadin as directed.  2.  Lipitor 10 mg daily.  3.  Lisinopril 5 mg daily.  4.  Amiodarone 40 mg daily.   FAMILY HISTORY:  Mother died in her 62s of an MI.  Father died in his 52s of  heart failure and COPD.  He has two brothers, one of which had an MI and  cerebral aneurysm.   SOCIAL HISTORY:  He lives in [place] with his wife.  He was previously a Ecologist.  He was waiting to go back to work.  He has three grown children,  one of which lives with him and his wife.  He smoked a third to a half a  pack a day for approximately 45 years, quitting in October 2006.  He  occasionally has a mixed drink every couple of months.  He denies any drug  use and does not routinely exercise.   REVIEW OF SYSTEMS:  Positive for dyspnea on exertion, palpitations,  tachycardia.  All other systems reviewed are negative.   PHYSICAL EXAMINATION:  VITAL SIGNS:  Temperature 99.2, heart rate 131,  respirations 20, blood pressure 105/80, pulse ox 99% on room air.  GENERAL:  Pleasant white male in no acute distress.  Awake, alert, and  oriented x3.  NECK:  Normal carotid upstrokes.  No bruits or JVD.  LUNGS:  Respirations regular and unlabored.  Clear to auscultation.  CARDIAC:  Irregularly irregular S1/S2.  No S3, S4, or murmurs.  ABDOMEN:  Round, soft, nontender, nondistended.  Bowel sounds present x4.  EXTREMITIES:  Warm, dry, pink.  No clubbing or cyanosis with ecchymosis  resolving on the right lower extremity.  SKIN:  Warm and dry with well-healing sternal and abdominal surgical scars  as well as right lower extremity scars.   LABORATORY DATA:  Chest x-ray is pending.  EKG shows atrial fibrillation.   Labs are pending.   ASSESSMENT/PLAN:  1.   Atrial fibrillation.  Will plan to admit him and add IV Diltiazem for      rate control.  INR was therapeutic on October 19.  Plan on DCCV in the      a.m.  Will continue amiodarone and Coumadin.  2.  Coronary artery disease.  This is stable status post CABG.  Continue      Statin, ACE inhibitor, add low dose aspirin.  3.  Hypertension, stable.  4.  Hyperlipidemia.  Check lipid panel.  continue Statin.  5.  Tobacco abuse.  He quit in October 2006.      Ok Anis, NP      Jesse Sans. Wall, M.D.  Electronically Signed    CRB/MEDQ  D:  07/10/2005  T:  07/10/2005  Job:  [id number]

## 2011-01-20 NOTE — Discharge Summary (Signed)
Justin Valencia, Justin Valencia                ACCOUNT NO.:  [id number]   MEDICAL RECORD NO.:  [id number]          PATIENT TYPE:  INP   LOCATION:  2041                         FACILITY:  [facility]   PHYSICIAN:  Salvatore Decent. Cornelius Moras, M.D. DATE OF BIRTH:  Feb 21, 1941   DATE OF ADMISSION:  06/13/2005  DATE OF DISCHARGE:  06/20/2005                                 DISCHARGE SUMMARY   HISTORY OF PRESENT ILLNESS:  The patient is a 70 year old male from [place],  [place] with a known history of aortic stenosis, hypertension,  hyperlipidemia and tobacco use.  Mr. Rack states that he was told many  years ago he had an abnormal heart valve, when he underwent a physical exam  in the [employer].  Over the last few years, he had been followed by Drs.  Dimas Aguas and Myrtis Ser with known history of aortic stenosis.  The patient stated  that over the last year or so he developed progressive exertional symptoms  consistent with angina.  Specifically, he described a burning, sharp,  substernal pain radiating across the chest that was always associated with  physical activity and relieved promptly with rest.  Symptoms were also  associated with shortness of breath.  The patient also developed shortness  of breath with relatively mild physical activity.  He was seen in followup  with Dr. Myrtis Ser and underwent an echocardiogram that demonstrated severe  aortic stenosis with mean and peak transvalvular gradients that were  significantly elevated.  There was also some question raised at that time of  aneurysmal dilatation of the interatrial septum with possible small  atrioseptal defect of patent foramen ovale.  He was scheduled for elective  right and left heart catheterization, which was performed on May 25, 2005 by Dr. Randa Evens and findings were notable for the presence of  moderate-to-severe aortic stenosis with two-vessel coronary artery disease  and normal left ventricular function.  There was also mild hypertension.   He  was referred to Dr. Cornelius Moras for surgical intervention.  Dr. Cornelius Moras evaluated the  patient and studies and agreed to proceed with the surgical  revascularization and aortic valve replacement.  He was admitted this  hospitalization for the procedure.   PAST MEDICAL HISTORY:  1.  Aortic stenosis.  2.  Coronary artery disease.  3.  Hypertension.  4.  Hyperlipidemia.  5.  Kidney stones.  6.  Mild rash and itchiness involving both lower legs.   PAST SURGICAL HISTORY:  Bilateral inguinal hernia repair.   MEDICATIONS PRIOR TO ADMISSION:  1.  Lipitor 10 mg daily.  2.  Lisinopril 5 mg daily.  3.  Aspirin 325 mg daily.  4.  Multivitamin one daily.  5.  Tylenol p.r.n.   ALLERGIES:  No known drug allergies.   FAMILY HISTORY:  Please see the history and physical done at the time of  admission.   SOCIAL HISTORY:  Please see the history and physical done at the time of  admission.   REVIEW OF SYSTEMS:  Please see the history and physical done at the time of  admission.   PHYSICAL EXAMINATION:  Please see the history and physical done at the time  of admission.   HOSPITAL COURSE:  The patient was admitted electively and on June 13, 2005, taken to the operating room, at which time he underwent the following  procedure:  Coronary artery bypass grafting x4.  The following grafts were  placed:  #1 - Left internal mammary artery to the LAD.  #2 - Saphenous vein  graft to the posterior descending.  #3 - Saphenous vein graft to obtuse  marginal #1.  #4 - Saphenous vein graft to the diagonal.  Additionally, the  aortic valve replacement was done with a 23-mm pericardial tissue valve and  there was also the closure of the atrioseptal defect.  A TEE done during the  procedure revealed moderate mitral regurgitations, both pre and  postoperatively, measured at 2+.  The patient tolerated the procedure well  and was taken to the surgical intensive care unit in stable condition.   POSTOPERATIVE  HOSPITAL COURSE:  The patient did quite well.  He did have a  mild postoperative anemia.  He had moderate volume excess; he responded well  to diuretics.  He did have additionally postoperative atrial fibrillation  requiring treatment with intravenous amiodarone as well as other medications  for rate control.  He responded well to this, reverting back to a normal  sinus rhythm.  He was placed on Coumadin for his aortic valve replacement.  Daily INRs were checked and used for management with dosing.  The patient  had routine lines, monitors and drainage devices discontinued in the  standard fashion.  His incisions showed good evidence of healing without  signs of infection.  He was weaned from oxygen and maintained good  saturations on room air.  He was evaluated on June 20, 2005 and felt to  be quite stable at that time for discharge.   MEDICATIONS ON DISCHARGE:  1.  Lisinopril 5 mg daily.  2.  Lipitor 10 mg daily.  3.  Coumadin 5 mg daily.  4.  Amiodarone 400 mg daily.  5.  Lasix 40 mg daily for 10 days.  6.  K-Dur 20 mEq daily for 10 days.  7.  For pain, Tylox one or two every 4-6 hours as needed.   INSTRUCTIONS:  The patient received written instructions in regard to  medications, activity, diet, wound care and followup.   FOLLOWUP:  Followup included Coumadin management at the [facility] office,  additional followup with Dr. Myrtis Ser in [facility] office 2 weeks post  discharge, additionally Dr. Cornelius Moras 3 weeks post discharge.   CONDITION ON DISCHARGE:  Stable and improved.   FINAL DIAGNOSES:  1.  Aortic stenosis and coronary artery disease, status post the above-      mentioned procedures.  2.  Hypertension.  3.  Hyperlipidemia.  4.  History of kidney stones.  5.  History of mild rash and itchiness involving both legs.  6.  Postoperative anemia.  7.  Postoperative atrial fibrillation.      Rowe Clack, P.A.-C.     Salvatore Decent. Cornelius Moras, M.D.  Electronically  Signed    WEG/MEDQ  D:  09/20/2005  T:  09/21/2005  Job:  [id number]   cc:   Willa Rough, M.D.  [address]  [address]  [place]  [place] [address]   Selinda Flavin  Fax: [phone]

## 2011-01-20 NOTE — Op Note (Signed)
NAMESHYNE, LEHRKE                ACCOUNT NO.:  [id number]   MEDICAL RECORD NO.:  [id number]          PATIENT TYPE:  INP   LOCATION:  2306                         FACILITY:  [facility]   PHYSICIAN:  Salvatore Decent. Cornelius Moras, M.D. DATE OF BIRTH:  1941-07-15   DATE OF PROCEDURE:  06/13/2005  DATE OF DISCHARGE:                                 OPERATIVE REPORT   PREOPERATIVE DIAGNOSIS:  Severe aortic stenosis, 3-vessel coronary artery  disease, possible patent foramen ovale.   POSTOPERATIVE DIAGNOSIS:  Severe aortic stenosis, 3-vessel coronary artery  disease, secundum-type atrial septal defect.   PROCEDURE:  Median sternotomy for aortic valve replacement (23-mm Edwards  bovine pericardial tissue valve), coronary artery bypass grafting x4 (left  internal mammary artery to distal left anterior descending coronary artery,  saphenous vein graft to first diagonal branch, saphenous vein graft to first  circumflex marginal branch, saphenous vein graft to posterior descending  coronary artery, endoscopic saphenous vein harvest from right thigh and  right lower leg), and closure of secundum-type atrial septal defect.   SURGEON:  Dr. Purcell Nails.   ASSISTANT:  Theda Belfast, PA   ANESTHESIA:  General   BRIEF CLINICAL NOTE:  The patient is a 70 year old white male from [place],  [place] with known history of aortic stenosis, hypertension,  hyperlipidemia, and tobacco use. The patient now presents with worsening  symptoms of exertional shortness of breath and chest discomfort. He  underwent followup echocardiogram demonstrating severe aortic stenosis with  peak and mean transvalvular gradients across the aortic valve of 74 and 40  mmHg respectively. There was also some question raised regarding the  possibility of a patent foramen ovale. Mr. Billig underwent elective left  and right heart catheterization by Dr. Samule Ohm.  At the time of  catheterization confirmed the presence of  moderate-to-severe aortic stenosis  with 2-to-3-vessel coronary artery disease and normal left ventricular  systolic function. A full consultation note has been dictated previously.   FINDINGS:   OPERATIVE CONSENT:  The patient and his family have been counseled at length  regarding the indications and potential benefits of the aortic valve  replacement and coronary artery bypass grafting. Alternative treatment  strategies have been discussed. They also understand the plan for  intraoperative transesophageal echocardiogram to assess for the possibility  of patent foramen ovale or atrial septal defect with plans to close any such  defect if it is discovered. After considerable discussion, the patient  specifically requests that a bioprosthetic tissue valve be utilized to  replace his aortic valve in an effort to avoid the need for lifelong  anticoagulation with Coumadin. He understands and accepts all associated  risks of surgery including but not limited to risk of death, stroke,  myocardial infarction, congestive heart failure, respiratory failure,  pneumonia, bleeding requiring blood transfusion, arrhythmia, heart block  with bradycardia requiring permanent pacemaker, recurrent coronary artery  disease, late complications related to the bioprosthetic valve including the  possibility of late valve degeneration and failure. All of their questions  have been addressed.   OPERATIVE FINDINGS:  1.  Severe aortic stenosis.  2.  Moderate mitral regurgitation.  3.  Normal left ventricular systolic function with moderate left ventricular      hypertrophy.  4.  Secundum-type atrial septal defect.  5.  Good-quality left internal mammary artery and saphenous vein conduit for      grafting.  6.  Diffuse coronary artery disease with fair-quality targets for grafting.   OPERATIVE NOTE IN DETAIL:  The patient is brought to the operating room on  the above-mentioned date; and central monitoring is  established by the  anesthesia service under the care and direction of Dr. Sharee Holster.  Specifically, a Swan-Ganz catheter is placed through the right internal  jugular approach. A radial arterial line is placed. Intravenous antibiotics  are administered. Following induction with general endotracheal anesthesia,  a Foley catheter is placed. The patient's chest, abdomen, both groins, and  both lower extremities are prepared and draped in a sterile manner.   Baseline transesophageal echocardiogram is performed by Dr. Jacklynn Bue. This  confirms the presence of severe aortic stenosis. The aortic valve was  heavily calcified. There is mild aortic insufficiency. There is moderate  left ventricular hypertrophy with normal left ventricular systolic function.  There is moderate (2+) mitral regurgitation. There is some calcification in  the posterior mitral annulus with some degenerative changes of the posterior  leaflet of the mitral valve, but no areas of mitral valve prolapse. The jet  of mitral regurgitation appears to be central. There is a secundum-type  atrial septal defect with left-to-right shunting.   A median sternotomy incision is performed and the left internal mammary  artery is dissected from the chest wall and prepared for bypass grafting.  The left internal mammary artery has good-quality conduit. Simultaneously  saphenous vein is obtained from the patient's right thigh and the upper  portion of the right lower leg using endoscopic vein harvest technique. The  saphenous vein is good-quality conduit. After the saphenous vein is removed,  the small surgical incisions in the right lower extremity are closed in  multiple layers with running absorbable suture. The patient is heparinized  systemically. After systemic heparinization, the left internal mammary  artery is transected at its distal end and prepared for bypass grafting.  The pericardium is opened. The ascending aorta is  normal in appearance. The  ascending aorta is cannulated for cardiopulmonary bypass. The venous cannula  is placed directly in the superior vena cava. A second venous cannula is  placed through the tip of the right atrial appendage extending down into the  inferior vena cava. A retrograde cardioplegic catheter is placed through the  right atrium into the coronary sinus.   Cardiopulmonary bypass is begun. Vessel loops are placed around the superior  vena cava and the inferior vena cava. Left ventricular vent is placed  through the right superior pulmonary vein. Distal sites are selected for  coronary artery bypass grafting. There is moderate left ventricular  hypertrophy. There is diffuse coronary artery disease with hard palpable  plaque in all of the epicardial coronary arteries. Portions of the saphenous  vein and the left internal mammary artery are trimmed to appropriate  lengths. A temperature probe is placed in the left ventricular septum. A  cardioplegic catheter is placed in the ascending aorta.   The patient is cooled to 28 degrees systemic temperature. The aortic  crossclamp is applied and cold blood cardioplegia is administered initially  in an antegrade fashion through the aortic root. Iced saline slush was  applied for topical hypothermia. Supplemental  cardioplegia is administered  retrograde through the coronary sinus catheter. The initial cardioplegic  arrest and myocardial cooling are felt to be excellent. Repeat doses of  cardioplegia are administered intermittently throughout the crossclamp  portion of the operation through the aortic root, down the subsequently  placed vein grafts, and retrograde through the coronary sinus catheter to  maintain left ventricular septal temperature below 15 degrees centigrade.   The following distal coronary anastomoses are performed:  1.  The posterior descending coronary artery is grafted with a saphenous      vein graft in an  end-to-side fashion. This anastomosis is placed      immediately after the bifurcation of the distal right coronary artery.      The distal right coronary artery is diffusely diseased. At the site of      distal bypass the posterior descending coronary artery measures 1.5 mm      in diameter and is a fair-to-good quality target.  2.  The first circumflex marginal branch is grafted with a saphenous vein      graft in an end-to-side fashion. This vessel measures 1.5 mm in diameter      and is a good quality target.  3.  The first diagonal branch off the left anterior descending coronary      artery is grafted with a saphenous vein graft in an end-to-side fashion.      This vessel measures 1.5 mm in diameter and is a good quality target.  4.  The distal left anterior descending coronary artery is grafted with left      internal mammary artery in an end-to-side fashion. This vessel is     diffusely diseased. It measures 1.4 mm in diameter at the site of distal      bypass and is of fair-to-poor quality target.   An oblique aortotomy is performed anteriorly. The aortic valve is exposed.  The aortic valve is congenitally bicuspid with fused RFA between the  noncoronary and right coronary cusp of the valve. There is heavy  calcification with secondary severe aortic stenosis. The left main and the  right coronary arteries are in their usual anatomical location. The aortic  valve is excised sharply. The aortic annulus is decalcified. This is  technically straightforward and the annular calcification is mild-to-  moderate in severity. After complete decalcification of the aortic annulus,  the aortic root is irrigated with iced saline solution. The aortic root is  then sized and a 23-mm, stented, bioprosthetic valve will fit easily.   Aortic valve replacement is performed using interrupted 2-0 Ethibond,  horizontal mattress pledgeted sutures with pledgets in the subannular  position. An Edwards  bovine pericardial tissue valve magna series (model  number 3000, serial number [id number]) is secured in place uneventfully. After  aortic valve replacement, the valve is carefully inspected to make sure that  it was seated completely all way around; and that there was ample room above  for the left main and right coronary artery ostia. Rewarming is begun.   Aortotomy is closed using a two-layer closure of running 4-0 Prolene suture.   An oblique right atriotomy is performed after cinching up the vessel loops  around the superior-and-inferior vena cavae. The interatrial septum is  exposed. There is a secundum-type atrial septal defect with a large floppy  redundant flap of interatrial septal tissue, but a defect the size of a  nickel between the left and right atrium. Due to the large redundant  interatrial septum,  the defect is closed primarily with a two-layer closure  of running 4-0 Prolene suture. Patch closure is felt not necessary due to  the redundant interatrial septal tissue which is easily utilized to close  the defect. The right atriotomy incision is then closed using a two-layer  closure of running 4-0 Prolene suture.   All three proximal saphenous vein anastomoses are performed directly to the  ascending aorta prior to removal of the aortic crossclamp. The left internal  mammary artery graft is opened. The left ventricular septal temperature  rises, although it seems to rise somewhat slowly. Secondarily, the bulldog  clamp is reapplied to the left internal mammary artery and one additional  dose of cold retrograde cardioplegia is administered. The left internal  mammary artery graft, distal anastomosis is taken down and carefully  inspected. However, it is clear that the anastomosis was widely patent as it  could easily be probed in all directions. The distal anastomosis is then  resewn using running 8-0 Prolene suture. The left internal mammary artery graft is again opened and  the left ventricular septal temperature rises  gradually. One final dose of warm retrograde hot shot cardioplegia is  administered. The patient is placed in Trendelenburg position. The lungs are  ventilated; and the heart allowed to fill and all air evacuated through the  aortic root. The aortic crossclamp is removed after a total crossclamp time  of 167 minutes.   The heart begins to beat spontaneously without need for cardioversion. The  retrograde cardioplegic catheter is removed. All proximal and distal  coronary anastomoses are inspected for hemostasis and appropriate graft  orientation. The aortotomy incision is also inspected for hemostasis.  Epicardial pacing wires are fixed to the right ventricular outflow tract  into the right atrial appendage. The left ventricular vent is removed. A  Magoon needle is placed in the ascending aorta to serve as a root vent. The  superior vena cava cannula is now removed after pulling the IVC cannula back  into the right atrium. The patient is rewarmed to 37 degrees centigrade  temperature.   The patient is weaned from the cardiopulmonary bypass without difficulty.  The patient's rhythm at separation from bypass in sinus rhythm with second-  degree AV block. AV sequential pacing is employed. Low-dose dopamine  infusion is utilized. Total cardiopulmonary bypass time for the operation is  205 minutes. Followup transesophageal echocardiogram performed by Dr.  Jacklynn Bue after separation from bypass demonstrates preserved left  ventricular systolic function. There is a well seated aortic valve  bioprosthesis that is functioning normally. There is no sign of aortic  insufficiency. There is insignificant residual air. There is no sign of any  residual communication between the left atrium and right atrium. There  remains a solid moderate (2+) mitral regurgitation. This may be slightly  worse than preoperatively, although the jet of mitral regurgitation  does not  cross the entire left atrium and there is no sign of any flow reversal in  the pulmonary veins. Pulmonary artery pressures are similar to how they were  preoperatively.   The venous and arterial cannulae are removed uneventfully. Protamine is  administered to reverse the anticoagulation. The mediastinum and the left  chest are irrigated with saline solution containing vancomycin. Meticulous  surgical hemostasis is ascertained. The mediastinum in the left chest are  drained using three chest tubes exited through separate stab incisions  inferiorly. The soft tissues anterior to the aorta are reapproximated  loosely and the sternum is  closed with double-strength sternal wire. The  soft tissues anterior to the sternum are closed in multiple layers and the  skin is closed with a running subcuticular skin closure.   The patient tolerated the procedure well and is transported to the surgical  intensive care unit in stable condition. There are no intraoperative complications. All sponge, instrument and needle counts are verified correct  at completion of the operation. No blood products were administered.      Salvatore Decent. Cornelius Moras, M.D.  Electronically Signed     CHO/MEDQ  D:  06/13/2005  T:  06/13/2005  Job:  [id number]   cc:   Willa Rough, M.D.  [address]  [address]  [place]  [place] [address]   Selinda Flavin  Fax: [phone]

## 2011-01-20 NOTE — H&P (Signed)
NAMELOMAX, POEHLER                ACCOUNT NO.:  [id number]   MEDICAL RECORD NO.:  [id number]           PATIENT TYPE:   LOCATION:                                 FACILITY:   PHYSICIAN:  Salvatore Decent. Cornelius Moras, M.D.      DATE OF BIRTH:   DATE OF ADMISSION:  DATE OF DISCHARGE:                                HISTORY & PHYSICAL   DATE OF PLANNED HOSPITAL ADMISSION:  June 13, 2005   CHIEF COMPLAINT:  Exertional chest pain and shortness of breath.   HISTORY OF PRESENT ILLNESS:  Mr. Buckwalter is a 70 year old white male from  [place], [place] with known history of aortic stenosis, hypertension,  hyperlipidemia, and tobacco use. Mr. Corlew states that he was told many  years ago he had an abnormal heart valve when he underwent a physical exam  for the [employer]. Over the last few years he has been followed by Dr. Selinda Flavin and Dr. Willa Rough with known history of aortic stenosis. He states  that over the last year or so he has developed progressive exertional  symptoms consistent with angina. Specifically, he describes a burning,  sharp, substernal pain radiating across his chest that is always associated  with physical activity and relieved promptly by rest. These symptoms are  associated with shortness of breath. The patient also develops shortness of  breath with relatively mild physical activity. He denies any episodes of  chest pain or shortness of breath occurring at rest or with minimal  activity. He denies any nocturnal symptoms. He has had some mild dizzy  spells recently, particularly after trying to do something strenuous. He  denies any syncopal episodes. He reports no PND, orthopnea, or lower  extremity edema. His symptoms have progressed considerably over the last 2  months. He was seen in follow-up by Dr. Willa Rough at the [facility]  Cardiology office in [place]. He apparently underwent a follow-up  echocardiogram demonstrating severe aortic stenosis with peak and mean  transvalvular gradients of 74 and 40 mmHg respectively. We do not have the  official report from this study, although we have Dr. Henrietta Hoover office note  from September 12. There was also some question raised at that time  regarding aneurysmal dilatation of the interatrial septum with possible  small atrial septal defect or patent foramen ovale. Mr. Witczak was  subsequently scheduled for elective left and right heart catheterization.  This was performed on September 21 by Dr. Randa Evens and findings were  notable for the presence of moderate to severe aortic stenosis with two-  vessel coronary artery disease and normal left ventricular function. There  was mild pulmonary hypertension. Apparently, Mr. Burlingame also underwent a  follow-up echocardiogram with a bubble study at that time, although the  report from this exam is not currently available. Mr. Cowles was referred  for possible elective surgical intervention.   REVIEW OF SYSTEMS:  GENERAL: The patient reports feeling well otherwise. He  has good appetite. He has not been gaining or losing weight recently.  CARDIAC: Notable for progressive symptoms of exertional angina and  exertional shortness of breath as noted previously. These are functional  class II to class III at most. RESPIRATORY: Notable only for exertional  shortness of breath. The patient denies productive cough, hemoptysis,  wheezing. GASTROINTESTINAL: Negative. The patient has no difficulty  swallowing. He denies symptoms of hematochezia, hematemesis, or melena. He  reports occasional constipation. He has occasional hemorrhoids. This does  not seem to be problematic. MUSCULOSKELETAL: Negative. The patient denies  particular problems with arthritis or arthralgias. NEUROLOGIC: Negative. 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 He  denies any transient monocular blindness. He  denies any transient numbness or weakness involving only one side of his  body otherwise. He denies symptoms suggestive of seizure. GENITOURINARY:  Negative. The patient reports no difficulty urinating. He has had kidney  stones in the past. PERIPHERAL VASCULAR: Negative. The patient denies  symptoms suggestive of claudication. PSYCHIATRIC: Negative. HEENT: Notable  in that the patient has an upper plate of dentures and six remaining teeth  on his lower jaw. He has not seen a dentist in quite some time and he  reports that there is some tenderness along the left lower jaw but no recent  known infections or loose teeth. He has a partial lower plate but does not  wear it due to the pain and poor fit. INFECTIOUS: Negative. The patient  denies recent fevers or chills. The patient has had intermittent problems  with itchiness and mild skin rash in both lower legs over the last 6 months.  This seems to wax and wane sporadically.   PAST MEDICAL HISTORY:  1.  Aortic stenosis.  2.  Coronary artery disease  3.  Hypertension.  4.  Hyperlipidemia.  5.  Kidney stones.  6.  Mild rash and itchiness involving both lower legs.   PAST SURGICAL HISTORY:  Bilateral inguinal hernia repair approximately 3  years ago.   FAMILY HISTORY:  Noncontributory.   SOCIAL HISTORY:  The patient is married and lives with his wife in [place].  They have three grown children, one of whom lives at home presently. 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 This does require  some strenuous activity at times, although typically does not require heavy  lifting. The patient does report longstanding history of tobacco use,  although he states that he smokes between one-quarter and one-half pack of  cigarettes per day and has cut back from that recently. He has never been a  very heavy smoker. He denies history of excessive alcohol consumption.   CURRENT  MEDICATIONS: 1.  Lipitor 10 mg daily.  2.  Lisinopril 5 mg daily.  3.  Aspirin 325 mg daily.  4.  Multivitamin 1 tablet daily.  5.  Tylenol as needed for pain.   DRUG ALLERGIES:  None known.   PHYSICAL EXAMINATION:  GENERAL:  The patient is a well-appearing, mildly-  obese white male who appears his stated age in no acute distress.  VITAL SIGNS:  Blood pressure is 158/80, pulse 75 and regular. He is afebrile  with oxygen saturation 98% on room air.  HEENT:  Exam is notable with six remaining teeth on the lower jaw. There is  no obvious dental infection or abscess appreciated. There is upper plate  dentures.  NECK:  Supple. There is no cervical or supraclavicular lymphadenopathy.  There  is no jugular venous distension. No carotid bruits noted.  LUNGS:  Auscultation of the chest reveals clear and symmetrical breath  sounds bilaterally. No wheezes or rhonchi are demonstrated.  CARDIOVASCULAR:  Exam includes regular rate and rhythm. There is a prominent  grade 3/6 systolic murmur heard best along the sternal border with radiation  to the neck. No diastolic murmurs are noted.  ABDOMEN:  Mildly obese but soft and nontender. There are no palpable masses.  Bowel sounds are present.  EXTREMITIES:  Warm and well-perfused. There is no lower extremity edema.  Distal pulses are palpable in both lower legs in the posterior tibial  position, although slightly diminished on the right compared to the left.  There is no sign of venous insufficiency. There is no lower extremity edema.  There is a slight rash in the right lower leg anteriorly that appears to be  related to trauma from scratching. There are no open skin lesions or  ulcerations. RECTAL AND GENITOURINARY:  Exams are both deferred.  NEUROLOGIC:  Examination is grossly nonfocal and symmetrical throughout.   DIAGNOSTIC TESTS:  Cardiac catheterization performed May 25, 2005 by  Dr. Samule Ohm is reviewed. This demonstrates at least  moderate aortic stenosis,  normal left ventricular systolic function, mild mitral regurgitation, and at  least two to three-vessel coronary artery disease. Specifically, peak and  mean gradients across the aortic valve were measured 42 and 31 mmHg  respectively. Using the [facility] cardiac output, the valve area was measured  1.28 sqcm based on a resting cardiac output of 5.2 L per minute. PA  pressures were measured 48/27 with a pulmonary capillary wedge pressure mean  of 27. The central venous pressure was 23 mmHg. Left ventricular function  appears normal with ejection fraction 70%. There is some (mild, 1+) mitral  regurgitation. There is severe two- to three-vessel coronary artery disease.  There is heavy calcification of the proximal left anterior descending  coronary artery with 70% stenosis in the mid portion of this vessel. There  appears to be 80% proximal stenosis of a large first diagonal branch. There appears to be 50-60% proximal stenosis of a first circumflex marginal  branch. There is 90% stenosis of the mid right coronary artery with right  dominant coronary circulation. The ascending thoracic aorta, aortic root,  and aortic arch were not visualized at the time of catheterization. There  was 50% left renal artery stenosis on one of two vessels supplying the left  kidney.   IMPRESSION:  Moderate to severe aortic stenosis with severe two- to three-  vessel coronary artery disease, normal left ventricular systolic function,  and progressive symptoms of exertional angina and shortness of breath. I  believe that Mr. Mceuen would best be treated by elective aortic valve  replacement and coronary artery bypass grafting. There is some question as  to whether or not Mr. Rog may have a small atrial septal defect or patent  foramen ovale, and we would certainly take care of this at the time of  surgery if this was identified. He needs dental consultation prior to  surgery. His  ascending aorta and aortic arch were not visualized at the time  of catheterization.   PLAN:  We will send Mr. Daigler for a CT angiogram of the ascending thoracic  aorta. We will also obtain a dental consultation this week. I have outlined  options at length with Mr. Turano and his wife here in the office today.  Alternative strategies have been discussed in detail  and all of their  questions have been addressed. We tentatively plan to proceed with aortic  valve replacement and coronary artery bypass grafting on Tuesday, June 13, 2005. If a patent foramen ovale is present we will close this at the  time of surgery. If there is aneurysmal dilatation of the ascending thoracic  aorta this will also need to be corrected. There does appear to be some mild  mitral regurgitation on his catheterization films, although I suspect this  will not require any sort of intervention. I have reviewed at length the  indications, risks, and potential benefits of surgery with Mr. Rosten and  his wife. They understand and accept all associated risks of surgery  including but not limited to risk of death, stroke, myocardial infarction,  congestive heart failure, respiratory failure, pneumonia, bleeding requiring  blood transfusion, arrhythmia, infection, and recurrent coronary artery  disease. We have also specifically discussed alternative strategies with  respect to what type of valve to replace his aortic valve with. In  particular, we have talked about the relative risks and benefits of use of a  mechanical prosthesis with secondary need for lifelong anticoagulation with  Coumadin versus using some type of bioprosthetic tissue valve which would  obviate the need for anticoagulation but would be associated with some  potential risk of late valve degeneration and failure. After considerable  discussion, Mr. Geller specifically desires  that we use a bioprosthetic tissue valve in effort to avoid the need  for lifelong anticoagulation with Coumadin. He understands that given his age  there is a chance that at some point in the future he could develop late  valve degeneration and perhaps even require repeat operation. All of his  questions have been addressed.      Salvatore Decent. Cornelius Moras, M.D.  Electronically Signed     CHO/MEDQ  D:  06/05/2005  T:  06/05/2005  Job:  [id number]   cc:   Willa Rough, M.D.  [address]  [address]  [place]  [place] [address]   Selinda Flavin  Fax: [phone]

## 2011-01-20 NOTE — Discharge Summary (Signed)
Justin Valencia, Justin Valencia                ACCOUNT NO.:  [id number]   MEDICAL RECORD NO.:  [id number]          PATIENT TYPE:  INP   LOCATION:  2038                         FACILITY:  [facility]   PHYSICIAN:  Willa Rough, M.D.     DATE OF BIRTH:  07-01-1941   DATE OF ADMISSION:  07/10/2005  DATE OF DISCHARGE:  07/11/2005                                 DISCHARGE SUMMARY   CARDIOLOGIST:  Willa Rough, M.D.   PRINCIPAL DIAGNOSIS:  Atrial fibrillation.   OTHER DIAGNOSES:  1.  Coronary artery disease status post CABG x4.  2.  Severe aortic stenosis status post AVR with Bovine pericardial tissue      valve.  3.  Secundum ASD status post ASD closure.  4.  Postoperative atrial fibrillation.  5.  Hypertension.  6.  Hyperlipidemia.  7.  Tobacco abuse.  8.  Nephrolithiasis.  9.  Inguinal hernia repair three years ago.  10. Remote tobacco abuse.   PROCEDURES:  DCCV.   ALLERGIES:  No known drug allergies.   HISTORY OF PRESENT ILLNESS:  This is a 70 year old white male with history  of CAD status post CABG x4 with AVR and ASD closure on June 13, 2005, by  Dr. Cornelius Moras.  Postoperative course is complicated by atrial fibrillation and  was treated with Amiodarone and Coumadin.  He was discharged home on October  17.  That day, while at the pharmacy checking his blood pressure, his heart  rate reading was 130-150.  He noted the sensation of tachycardia.  This has  been persistent since the day of discharge.  However, he has not sought any  medical care for this and thought that this was just part of the course.  He  does also have some mild dyspnea on exertion, but overall, has done  relatively well.  He saw Dr. Cornelius Moras on July 10, 2005, who after seeing his  heart rate consult with Dr. Myrtis Ser, decision was made to bring him into ED for  admission and Cardioversion.  Of note, his INR has been therapeutic dating  back to June 22, 2005, ranging between 2.5 and 3.   HOSPITAL COURSE:  Mr. Starnes was  admitted, and this afternoon, underwent  successful DCCV.  He is currently in sinus rhythm without complaints.  He  will be discharged later this evening in satisfactory condition.   DISCHARGE LABORATORIES:  Hemoglobin 10.4, hematocrit 31.7, WBC 7.3,  platelets 263,000.  MCV 104.9.  Sodium 140, potassium 4.1, chloride 111, CO2  23, BUN 15, creatinine 1.0, glucose 91.  PT 23.1, INR 2.0, total bilirubin  0.7, alk-phos 74, AST 17, ALT 17, albumin 3.5.  Cardiac markers are negative  x3.  Calcium 9.3, magnesium 2.2.  TSH 0.658.   DISPOSITION:  The patient is being discharged home today in good condition.   FOLLOWUP:  Follow up with Dr. Willa Rough in two weeks.  He will call for  that appointment.   DISCHARGE MEDICATIONS:  1.  Coumadin 2.5 mg Monday, Wednesday, Thursday, Saturday, Sunday.  2.  Coumadin 5 mg Tuesday and Friday.  3.  Lipitor 10  mg daily.  4.  Lisinopril 5 mg daily.  5.  Amiodarone 40 mg daily.  6.  Aspirin 81 mg daily.   OUTSTANDING LABORATORY DATA:  None.   DURATION OF DISCHARGE ENCOUNTER:  35 minutes.      Ok Anis, NP    ______________________________  Willa Rough, M.D.    CRB/MEDQ  D:  07/11/2005  T:  07/12/2005  Job:  [id number]

## 2011-01-20 NOTE — Op Note (Signed)
Justin Valencia, Justin Valencia                ACCOUNT NO.:  [id number]   MEDICAL RECORD NO.:  [id number]          PATIENT TYPE:  INP   LOCATION:  2306                         FACILITY:  [facility]   PHYSICIAN:  Burna Forts, M.D.DATE OF BIRTH:  09/20/1940   DATE OF PROCEDURE:  06/13/2005  DATE OF DISCHARGE:                                 OPERATIVE REPORT   PROCEDURE:  Transesophageal echocardiogram.   INDICATIONS FOR PROCEDURE:  Mr. Spinella is a 70 year old gentleman, a patient  of Dr. Tressie Stalker, who presents today for aortic valve replacement,  coronary artery bypass grafting, and probable PFO or ASD repair or closure.  He is brought to the holding area the morning of surgery, where under local  anesthesia and sedation, radial arterial and pulmonary artery catheters were  inserted.  He was then taken to the O.R. for routine induction of general  anesthesia.  The TE probe was then passed oropharyngeally into the stomach  and withdrawn slightly for imaging of the cardiac structures.   Pre-cardiopulmonary bypass TE examination:  Left ventricle:  The left  ventricular chamber in a short axis view is a concentrically hypertrophied  chamber.  All segments appear to thicken satisfactorily during systolic  contraction.  Long axis views and short axis views revealed good overall  contractility noted in the hypertrophied left ventricular chamber.  Papillary muscles are well outlined.  No other masses are noted within.   Mitral valve:  Mildly thickened mitral valve anterior and posterior leaflets  are noted, though their excursion during diastolic filling appears  satisfactory.  Overall motion appears satisfactory.  Doppler examination in  the pre-bypass period reveals 1-2+ mitral regurgitant flow.  This seems  primarily due to a calcium that has accumulated somewhat on the annular area  of the posterior leaflet which causes a slight knuckling of the posterior  leaflet, some mild decreased  excursion in one stalk.  Pictures were obtained  in multiple views.  It appears no worse than 1-2+ in this pre-bypass period.   Left atrium:  Some mildly increased left atrial chamber size is noted.  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  This is associated with on Doppler examination  a fairly significant left-to-right shunt visualized from the left atrium  into the right atrium.  Several views again were obtained of this.   Aortic valve:  The aortic valve is seen immediately to be heavily calcified.  There do appear to be 3 cusps.  It is severely restricted or limited in its  motion during opening.  There does appear to be a fused noncoronary and  right cusp along the raphe since only a portion of the left and noncoronary  cusps are actually opening.  There is severe restriction to opening.  Doppler examination and a long axis view reveals 3+ turbulent flow through  the stenotic area of the aortic valve.  Also, in the left ventricular  outflow tract the same trace regurgitant aortic insufficiency.   Right ventricle:  The right ventricular chamber is seen on several views.  There is good overall contractility and excursion and contraction.  Volume  size would be slightly increased.   Right atrium:  Right atrial chamber is noted, with the large defect again  seen, well visualized, from the right atrial chamber aspect.  The right  atrial chamber size is probably increased, associated with this increased  flow across the interatrial septum.   Tricuspid valve is noted ____tricuspid valve.  Essentially no regurgitant  flow is noted in the tricuspid valve or across the tricuspid valve.   The patient was placed on cardiopulmonary bypass.  Coronary artery bypass  grafting was carried out.  The ASD was repaired and closed, and the aortic  valve was  excised and replaced with a Carpentier-Edwards tissue valve.  De-  airing maneuvers were carried out, and the patient was rewarmed and  separated from cardiopulmonary bypass with the initial attempt.   Post-cardiopulmonary bypass TE examination (limited exam):  Left ventricle:  In the early bypass period, the left ventricular chamber in a short axis  view again shows good overall contractility and a good contractile pattern,  associated with good inward excursion in all segmental areas.   Mitral valve:  The mitral valve again shows 1-1/2 to 2+ mitral regurgitant  flow across the valve.  This is again interrogated and viewed for some time.  The amount of regurgitant flow is not radically different from the pre-  bypass period.  Again, it shows a portion of that posterior leaflet with a  slight knuckling appearance, associated with the heavy calcium in that  posterior leaflet.  We could not demonstrate any reversible flow in the  pulmonary vein that was visualized.   Interatrial septum is interrogated.  The aneurysmally dilated area of that  septum is viewed.  Doppler examination across the interatrial septum reveals  no flow across, and therefore a satisfactory closure is indicated.   Aortic valve:  The tissue valve is seen well seated in the aortic root area.  The 3 leaflets of the valve are seen and visualized with ease.  They open  satisfactorily and pose no obstruction to flow.  During diastole, they close  appropriately, with essentially no regurgitant flow noted.  This appears to  be a completely satisfactory closure.   The patient was then returned to the cardiac intensive care unit in stable  condition.           ______________________________  Burna Forts, M.D.     JTM/MEDQ  D:  06/13/2005  T:  06/13/2005  Job:  [id number]   cc:   Anesthesia Department

## 2011-01-20 NOTE — Cardiovascular Report (Signed)
NAMELAVARR, PRESIDENT                ACCOUNT NO.:  [id number]   MEDICAL RECORD NO.:  [id number]          PATIENT TYPE:  OIB   LOCATION:  1963                         FACILITY:  [facility]   PHYSICIAN:  Salvadore Farber, M.D. LHCDATE OF BIRTH:  24-Jan-1941   DATE OF PROCEDURE:  05/25/2005  DATE OF DISCHARGE:                              CARDIAC CATHETERIZATION   PROCEDURE:  Right and left heart catheterization, left ventriculogram,  coronary angiography, and selective left renal angiography.   INDICATIONS:  Mr. Brisco is a 70 year old gentleman with moderate aortic  stenosis by echocardiogram who presents with substantial exertional dyspnea.  Aortic valve replacement is being considered and he is referred for  diagnostic angiography.   PROCEDURAL TECHNIQUE:  Informed consent was obtained.  Under 1% lidocaine  local anesthesia, a 5-French sheath was placed in the right common femoral  artery and a 7-French sheath in the right common artery and a 7-French  sheath in the right common femoral vein using a modified Seldinger  technique.  Right heart catheterization was performed using a balloon-tipped  catheter.  Cardiac output was measured using both thick and thermodilution  techniques.  A 4-French pigtail catheter was then advanced to the ascending  aorta.  Pressure was measured simultaneously in the femoral arterial sheath  and the ascending aorta.  Pressures were found to match within 2 mmHg mean.  The pigtail catheter was then advanced over a wire into the left ventricle.  Simultaneous left ventricular and sheath pressures were measured.  Simultaneous capillary wedge and LV pressures were then measured.  Next,  ventriculography was performed by power injection.  Coronary angiography was  then performed using JL4 and JR4 catheters.  Lastly, the pigtail catheter  was reintroduced into the suprarenal abdominal aorta.  Abdominal aortography  was performed by power injection.  There was at  least moderate stenosis of  the left renal artery, not ideally seen on the aortogram.  Therefore,  selective left renal angiography was performed using the JR4 catheter.  The  patient tolerated the procedure well and was transferred to the holding room  in stable condition. Sheaths will be removed there .   DISSECTION COMPLICATIONS:  None.   DISSECTION FINDINGS:  1.  Hemodynamics:  RA 25/25/23, RV 51/19/26, PA 48/27/38, PCW 31/28/27, LV      171/13/23, aorta 123/75/95, cardiac output/index 5.2/2.4, thermo-      illusion 7.5/3.5.  There is aortic stenosis with a peak gradient of 42      mmHg and a mean gradient of 31 mmHg.  Using the thick cardiac output,      this gives an aortic valve area of 1.28 cm squared.  Using thermo-      illusion cardiac output, it gives a valve area of 1.85 cm squared.      There is no mitral stenosis.  2.  Dissection Ventriculography:  Ejection fraction approximately 70%      without regional wall motion abnormality.  There is no mitral      regurgitation.   CORONARY ANGIOGRAPHY:  1.  Left main:  Angiographically normal.  2.  LAD:  Moderate-sized vessel given rise to a single large diagonal.  The      proximal vessel is very heavily calcified.  There is a 70% stenosis of      the proximal vessel and a 70% stenosis of the mid vessel.  The remainder      of the vessel is relatively free of significant disease.  3.  Circumflex:  Moderate-sized vessel giving rise to a single obtuse      marginal.  There is a 30% stenosis proximally.  4.  RCA:  Moderate-sized dominant vessel.  There is a moderate length 90%      stenosis in its mid section.  5.  Abdominal aorta:  Substantial plaquing without stenosis or evidence of      aneurysm formation.  6.  Renal arteries:  There are two left renal arteries and one right renal      artery.  The superior vessel on the left is the dominant one, supplying      greater than 75% of the left kidney.  This vessel has  approximately 50%      osteal stenosis with a peak to peak translesional gradient of 15 mmHg,      as assessed with a 4-French catheter.  The right renal artery and left      inferior renal artery are both angiographically normal.   IMPRESSION/RECOMMENDATIONS:  The patient has severe 2-vessel coronary  disease with moderate aortic stenosis.  He also has moderate left renal  artery stenosis.  Will manage his renal artery stenosis conservatively, but  this should be assessed with serial ultrasounds over time.  The patient will  follow up with Dr. Willa Rough in the near future for further discussion of  the management of his coronary disease and aortic valvular disease.      Salvadore Farber, M.D. [facility]  Electronically Signed     WED/MEDQ  D:  05/25/2005  T:  05/26/2005  Job:  [id number]   cc:   Willa Rough, M.D.  [address]  [address]  [place]  [place] [address]   Selinda Flavin  Fax: [phone]

## 2011-04-21 ENCOUNTER — Other Ambulatory Visit (INDEPENDENT_AMBULATORY_CARE_PROVIDER_SITE_OTHER): Payer: Medicare Other

## 2011-04-21 DIAGNOSIS — I6529 Occlusion and stenosis of unspecified carotid artery: Secondary | ICD-10-CM

## 2011-04-28 NOTE — Procedures (Unsigned)
CAROTID DUPLEX EXAM  INDICATION:  Followup carotid disease.  HISTORY: Diabetes:  Yes. Cardiac:  Yes. Hypertension:  Yes. Smoking:  Previous. Previous Surgery: CV History:  Chest pain for 4 days, dysphagia for 2 weeks. Amaurosis Fugax No, Paresthesias No, Hemiparesis No                                      RIGHT             LEFT Brachial systolic pressure:         122               128 Brachial Doppler waveforms:         WNL               WNL Vertebral direction of flow:        Antegrade         Antegrade DUPLEX VELOCITIES (cm/sec) CCA peak systolic                   124               108 ECA peak systolic                   118               171 ICA peak systolic                   100               200 ICA end diastolic                   30                38 PLAQUE MORPHOLOGY:                  Heterogeneous     Heterogeneous PLAQUE AMOUNT:                      Mild              Moderate PLAQUE LOCATION:                    ICA               ICA  IMPRESSION: 1. 1%-39% right internal carotid artery stenosis. 2. Borderline 40%-59% left internal carotid artery stenosis. 3. Bilateral vertebral arteries are within normal limits. 4. In view of chest pain symptoms the patient was offered the     opportunity to speak with a clinic staff member, however, he     declined and is going to make an appointment with his cardiologist,     Dr. Myrtis Ser.  ___________________________________________ Fransisco Hertz, MD  LT/MEDQ  D:  04/21/2011  T:  04/21/2011  Job:  [id number]

## 2011-06-02 ENCOUNTER — Encounter: Payer: Self-pay | Admitting: Cardiology

## 2011-06-04 ENCOUNTER — Encounter: Payer: Self-pay | Admitting: Cardiology

## 2011-06-04 DIAGNOSIS — Z951 Presence of aortocoronary bypass graft: Secondary | ICD-10-CM | POA: Insufficient documentation

## 2011-06-04 DIAGNOSIS — Q211 Atrial septal defect: Secondary | ICD-10-CM | POA: Insufficient documentation

## 2011-06-04 DIAGNOSIS — I2510 Atherosclerotic heart disease of native coronary artery without angina pectoris: Secondary | ICD-10-CM | POA: Insufficient documentation

## 2011-06-04 DIAGNOSIS — I350 Nonrheumatic aortic (valve) stenosis: Secondary | ICD-10-CM | POA: Insufficient documentation

## 2011-06-04 DIAGNOSIS — M549 Dorsalgia, unspecified: Secondary | ICD-10-CM

## 2011-06-04 DIAGNOSIS — E785 Hyperlipidemia, unspecified: Secondary | ICD-10-CM | POA: Insufficient documentation

## 2011-06-04 DIAGNOSIS — I779 Disorder of arteries and arterioles, unspecified: Secondary | ICD-10-CM | POA: Insufficient documentation

## 2011-06-04 DIAGNOSIS — R9430 Abnormal result of cardiovascular function study, unspecified: Secondary | ICD-10-CM | POA: Insufficient documentation

## 2011-06-04 DIAGNOSIS — Z952 Presence of prosthetic heart valve: Secondary | ICD-10-CM | POA: Insufficient documentation

## 2011-06-04 DIAGNOSIS — E119 Type 2 diabetes mellitus without complications: Secondary | ICD-10-CM | POA: Insufficient documentation

## 2011-06-04 DIAGNOSIS — I340 Nonrheumatic mitral (valve) insufficiency: Secondary | ICD-10-CM | POA: Insufficient documentation

## 2011-06-04 DIAGNOSIS — G8929 Other chronic pain: Secondary | ICD-10-CM | POA: Insufficient documentation

## 2011-06-04 DIAGNOSIS — I739 Peripheral vascular disease, unspecified: Secondary | ICD-10-CM

## 2011-06-04 DIAGNOSIS — I4891 Unspecified atrial fibrillation: Secondary | ICD-10-CM | POA: Insufficient documentation

## 2011-06-05 ENCOUNTER — Encounter: Payer: Self-pay | Admitting: Cardiology

## 2011-06-05 ENCOUNTER — Ambulatory Visit (INDEPENDENT_AMBULATORY_CARE_PROVIDER_SITE_OTHER): Payer: Medicare Other | Admitting: Cardiology

## 2011-06-05 VITALS — BP 135/75 | HR 58 | Ht 70.0 in | Wt 221.0 lb

## 2011-06-05 DIAGNOSIS — R200 Anesthesia of skin: Secondary | ICD-10-CM | POA: Insufficient documentation

## 2011-06-05 DIAGNOSIS — I2510 Atherosclerotic heart disease of native coronary artery without angina pectoris: Secondary | ICD-10-CM

## 2011-06-05 DIAGNOSIS — R209 Unspecified disturbances of skin sensation: Secondary | ICD-10-CM

## 2011-06-05 DIAGNOSIS — R202 Paresthesia of skin: Secondary | ICD-10-CM

## 2011-06-05 DIAGNOSIS — R071 Chest pain on breathing: Secondary | ICD-10-CM

## 2011-06-05 DIAGNOSIS — Z952 Presence of prosthetic heart valve: Secondary | ICD-10-CM

## 2011-06-05 DIAGNOSIS — R0789 Other chest pain: Secondary | ICD-10-CM | POA: Insufficient documentation

## 2011-06-05 DIAGNOSIS — I4891 Unspecified atrial fibrillation: Secondary | ICD-10-CM

## 2011-06-05 NOTE — Assessment & Plan Note (Signed)
He is currently having some intermittent chest wall pain.  I reassured him.  No further workup.

## 2011-06-05 NOTE — Assessment & Plan Note (Signed)
Coronary disease is stable.  His chest pain now is chest wall pain.  No further workup.

## 2011-06-05 NOTE — Assessment & Plan Note (Signed)
This is not a cardiac symptom.  No further workup.

## 2011-06-05 NOTE — Progress Notes (Signed)
HPI Patient is seen today to followup aortic valve replacement and coronary artery disease.  I saw him last November 07, 2010.  As part of today's evaluation I have reviewed my old records and completely updated the current new EMR.  The patient underwent aortic valve replacement with tissue valve in 2006.  Echo in January, 2012 revealed good function.  His ejection fraction remains normal.  He has not had any syncope or presyncope.  He does have some chest discomfort it can come on and stay with him for greater than a day.  This appears to be chest wall pain.  He does not have any exertional symptoms.  He also complains of some intermittent numbness and tingling in his hands.  He tells me that he has worn some braces that seemed to help.  It sounds like carpal tunnel.  He says that he will not have surgery for this. No Known Allergies  Current Outpatient Prescriptions  Medication Sig Dispense Refill  . amLODipine (NORVASC) 10 MG tablet Take 10 mg by mouth daily.        Marland Kitchen aspirin 81 MG tablet Take 81 mg by mouth daily.        Marland Kitchen gemfibrozil (LOPID) 600 MG tablet Take 600 mg by mouth 2 (two) times daily.        Marland Kitchen glipiZIDE (GLUCOTROL XL) 10 MG 24 hr tablet Take 10 mg by mouth daily.        . hydrochlorothiazide (HYDRODIURIL) 25 MG tablet Take 25 mg by mouth daily.        . metoprolol tartrate (LOPRESSOR) 25 MG tablet Take 25 mg by mouth 2 (two) times daily.        . Multiple Vitamins-Minerals (PX COMPLETE SENIOR MULTIVITS) TABS Take 1 tablet by mouth daily.        Marland Kitchen oxycodone (OXY-IR) 5 MG capsule Take 5 mg by mouth as needed.          History   Social History  . Marital Status: Married    Spouse Name: N/A    Number of Children: N/A  . Years of Education: N/A   Occupational History  . Truck driver    Social History Main Topics  . Smoking status: Former Smoker -- 0.3 packs/day for 40 years    Types: Cigarettes    Quit date: 09/04/2004  . Smokeless tobacco: Never Used  . Alcohol Use: Yes    . Drug Use: Not on file  . Sexually Active: Not on file   Other Topics Concern  . Not on file   Social History Narrative   Married with 3 grown children    Family History  Problem Relation Age of Onset  . Heart attack Mother   . Heart failure Father   . COPD Father     Past Medical History  Diagnosis Date  . CAD, multiple vessel     Stress echo, January , 2012, no ischemia  . Mitral regurgitation     Echo, January, 2012  . Aortic stenosis     Aortic valve replacement 2006  . ASD secundum     Closure, October, 2006  . Hyperlipidemia     Abnormal LFTs in the past, Lipitor discontinued, followed by Dr. Dimas Aguas he  . Hypertension   . Diabetes mellitus type II   . Chronic back pain     And sternal  . Nephrolithiasis   . Bilateral carotid artery disease   . Ejection fraction     EF, .  60-65%,echo, January, 2012  . S/P aortic valve replacement     2006, bovine pericardial tissue valve /   , Normal function, echo, January, 2012  . Atrial fibrillation     Postoperative, previously treated with amiodarone and Coumadin, then stop  . Carotid artery disease     Bilateral, followed closely by Dr. Imogene Burn ([facility]), February, 2012  . Hx of CABG     2006, aortic valve replacement, ASD closure  . Chest wall pain     October, 2012  . Numbness and tingling in hands     October, 2012    Past Surgical History  Procedure Date  . Coronary artery bypass graft 06/2005    LIMA to LAD, SVG to first diagonal, SVG to first obtuse marginal, SVG to PDA  . Aortic valve replacement 06/2005    Bovine pericardial tissue/ Secundum ASD closure  . Inguinal hernia repair 2002    Bilateral  . Nose surgery   . Vasectomy   . Cholecystectomy     ROS  Patient denies fever, chills, headache, sweats, rash, change in vision, change in hearing, cough, nausea vomiting, urinary symptoms.  All other systems are reviewed and are negative.  PHYSICAL EXAM Patient is oriented to person time and place.  Affect  is normal.  Head is atraumatic.  There is no jugular venous distention area there is a systolic murmur that radiates to his neck.  Lungs are clear.  Respiratory effort is nonlabored.  Cardiac exam reveals S1-S2.  There is a systolic murmur compatible with flow murmur from his aortic prosthesis.  The abdomen is soft there is no peripheral edema.  There are no musculoskeletal deformities.  There no skin rashes.  There is very slight redness in one spot over his median sternotomy scar.  This is not infected and it is stable. Filed Vitals:   06/05/11 1030  BP: 135/75  Pulse: 58  Height: 5\' 10"  (1.778 m)  Weight: 221 lb (100.245 kg)    EKG is done today and reviewed by me.  He has increased voltage.  There is a Q wave in lead 3.  There is no significant change from prior EKG.  ASSESSMENT & PLAN

## 2011-06-05 NOTE — Assessment & Plan Note (Signed)
His aortic valve is working well.  There is a flow murmur.  We know that the function was good in January, 2012.  No further workup

## 2011-06-05 NOTE — Patient Instructions (Signed)
Continue all current medications. Your physician wants you to follow up in: 6 months.  You will receive a reminder letter in the mail one-two months in advance.  If you don't receive a letter, please call our office to schedule the follow up appointment   

## 2011-11-10 ENCOUNTER — Ambulatory Visit (INDEPENDENT_AMBULATORY_CARE_PROVIDER_SITE_OTHER): Payer: Medicare Other | Admitting: Cardiology

## 2011-11-10 ENCOUNTER — Encounter: Payer: Self-pay | Admitting: Cardiology

## 2011-11-10 VITALS — BP 130/74 | HR 56 | Ht 71.0 in | Wt 226.8 lb

## 2011-11-10 DIAGNOSIS — R071 Chest pain on breathing: Secondary | ICD-10-CM

## 2011-11-10 DIAGNOSIS — R0789 Other chest pain: Secondary | ICD-10-CM

## 2011-11-10 DIAGNOSIS — Z952 Presence of prosthetic heart valve: Secondary | ICD-10-CM

## 2011-11-10 DIAGNOSIS — I2510 Atherosclerotic heart disease of native coronary artery without angina pectoris: Secondary | ICD-10-CM

## 2011-11-10 DIAGNOSIS — Z954 Presence of other heart-valve replacement: Secondary | ICD-10-CM

## 2011-11-10 DIAGNOSIS — I4891 Unspecified atrial fibrillation: Secondary | ICD-10-CM

## 2011-11-10 NOTE — Assessment & Plan Note (Signed)
Coronary disease is stable. He had a stress echo in January, 2012. There was no ischemia. No further workup is needed at this time.

## 2011-11-10 NOTE — Assessment & Plan Note (Signed)
He continues to have mild chest wall pain. I feel this is noncardiac. No further workup.

## 2011-11-10 NOTE — Patient Instructions (Signed)
Your physician you to follow up in 1 year. You will receive a reminder letter in the mail one-two months in advance. If you don't receive a letter, please call our office to schedule the follow-up appointment. Your physician recommends that you continue on your current medications as directed. Please refer to the Current Medication list given to you today. 

## 2011-11-10 NOTE — Assessment & Plan Note (Signed)
He is doing well post tissue valve from 2006. His echo was normal in January, 2012. He does not need a followup echo at this time.

## 2011-11-10 NOTE — Progress Notes (Signed)
HPI Patient is seen for followupCoronary disease and aortic valve stenosis status post aortic valve replacement. Patient is doing very well. He is not having any chest pain. He has no shortness of breath. He's going about full activities. I saw him last October, 2012. He has been stable since then.  No Known Allergies  Current Outpatient Prescriptions  Medication Sig Dispense Refill  . amLODipine (NORVASC) 10 MG tablet Take 10 mg by mouth every evening.       Marland Kitchen aspirin 81 MG tablet Take 81 mg by mouth every evening.       Marland Kitchen gemfibrozil (LOPID) 600 MG tablet Take 600 mg by mouth 2 (two) times daily.        Marland Kitchen glipiZIDE (GLUCOTROL XL) 10 MG 24 hr tablet Take 10 mg by mouth daily.        . hydrochlorothiazide (HYDRODIURIL) 25 MG tablet Take 25 mg by mouth daily.        . metoprolol tartrate (LOPRESSOR) 25 MG tablet Take 25 mg by mouth 2 (two) times daily.        . Multiple Vitamins-Minerals (PX COMPLETE SENIOR MULTIVITS) TABS Take 1 tablet by mouth daily.        Marland Kitchen oxycodone (OXY-IR) 5 MG capsule Take 5 mg by mouth as needed.          History   Social History  . Marital Status: Married    Spouse Name: N/A    Number of Children: N/A  . Years of Education: N/A   Occupational History  . Truck driver    Social History Main Topics  . Smoking status: Former Smoker -- 0.3 packs/day for 40 years    Types: Cigarettes    Quit date: 09/04/2004  . Smokeless tobacco: Never Used  . Alcohol Use: Yes  . Drug Use: Not on file  . Sexually Active: Not on file   Other Topics Concern  . Not on file   Social History Narrative   Married with 3 grown children    Family History  Problem Relation Age of Onset  . Heart attack Mother   . Heart failure Father   . COPD Father     Past Medical History  Diagnosis Date  . CAD, multiple vessel     Stress echo, January , 2012, no ischemia  . Mitral regurgitation     Echo, January, 2012  . Aortic stenosis     Aortic valve replacement 2006  .  ASD secundum     Closure, October, 2006  . Hyperlipidemia     Abnormal LFTs in the past, Lipitor discontinued, followed by Dr. Dimas Aguas he  . Hypertension   . Diabetes mellitus type II   . Chronic back pain     And sternal  . Nephrolithiasis   . Bilateral carotid artery disease   . Ejection fraction     EF, . 60-65%,echo, January, 2012  . S/P aortic valve replacement     2006, bovine pericardial tissue valve /   , Normal function, echo, January, 2012  . Atrial fibrillation     Postoperative, previously treated with amiodarone and Coumadin, then stop  . Carotid artery disease     Bilateral, followed closely by Dr. Imogene Burn ([facility]), February, 2012  . Hx of CABG     2006, aortic valve replacement, ASD closure  . Chest wall pain     October, 2012  . Numbness and tingling in hands     October, 2012  Past Surgical History  Procedure Date  . Coronary artery bypass graft 06/2005    LIMA to LAD, SVG to first diagonal, SVG to first obtuse marginal, SVG to PDA  . Aortic valve replacement 06/2005    Bovine pericardial tissue/ Secundum ASD closure  . Inguinal hernia repair 2002    Bilateral  . Nose surgery   . Vasectomy   . Cholecystectomy     ROS  Patient denies fever, chills, headache, sweats, rash, change in vision, change in hearing, chest pain, cough, nausea vomiting, urinary symptoms. He has some vague right upper quadrant symptoms that do not sound significant. All other systems are reviewed and are negative.  PHYSICAL EXAM The patient is anxious as always. He is oriented to person time and place. Affect is normal. There is no jugulovenous distention. There is soft radiated murmur to the neck. Lungs are clear. Respiratory effort is nonlabored. Cardiac exam reveals S1 and S2. There are no clicks. There is a 2/6 systolic murmur compatible with his aortic valve replacement. Abdomen is soft. He is overweight. There is no peripheral edema. There no musculoskeletal deformities. There are no  skin rashes.  Filed Vitals:   11/10/11 0914  BP: 130/74  Pulse: 56  Height: 5\' 11"  (1.803 m)  Weight: 226 lb 12.8 oz (102.876 kg)   EKG is done today and reviewed by me. I compared to his old tracing. He has QRS duration of 110 ms. 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 There is no significant change.  ASSESSMENT & PLAN

## 2012-04-19 ENCOUNTER — Other Ambulatory Visit: Payer: PRIVATE HEALTH INSURANCE

## 2012-05-02 ENCOUNTER — Encounter: Payer: Self-pay | Admitting: Neurosurgery

## 2012-05-03 ENCOUNTER — Ambulatory Visit (INDEPENDENT_AMBULATORY_CARE_PROVIDER_SITE_OTHER): Payer: Medicare Other | Admitting: Neurosurgery

## 2012-05-03 ENCOUNTER — Ambulatory Visit (INDEPENDENT_AMBULATORY_CARE_PROVIDER_SITE_OTHER): Payer: Medicare Other | Admitting: *Deleted

## 2012-05-03 ENCOUNTER — Other Ambulatory Visit: Payer: Medicare Other

## 2012-05-03 ENCOUNTER — Encounter: Payer: Self-pay | Admitting: Neurosurgery

## 2012-05-03 ENCOUNTER — Ambulatory Visit: Payer: Medicare Other | Admitting: Neurosurgery

## 2012-05-03 VITALS — BP 136/71 | HR 50 | Resp 16 | Ht 70.5 in | Wt 228.4 lb

## 2012-05-03 DIAGNOSIS — I6529 Occlusion and stenosis of unspecified carotid artery: Secondary | ICD-10-CM

## 2012-05-03 NOTE — Progress Notes (Signed)
[facility] Carotid Office Note  CC: Annual carotid surveillance Referring Physician: Imogene Burn  History of Present Illness: 71 year old male patient of Dr. Imogene Burn followed for mild bilateral carotid stenosis. The patient denies any signs or symptoms of CVA, TIA, amaurosis fugax. The patient denies any new medical diagnoses or recent surgery.  Past Medical History  Diagnosis Date  . CAD, multiple vessel     Stress echo, January , 2012, no ischemia  . Mitral regurgitation     Echo, January, 2012  . Aortic stenosis     Aortic valve replacement 2006  . ASD secundum     Closure, October, 2006  . Hyperlipidemia     Abnormal LFTs in the past, Lipitor discontinued, followed by Dr. Dimas Aguas he  . Hypertension   . Diabetes mellitus type II   . Chronic back pain     And sternal  . Nephrolithiasis   . Bilateral carotid artery disease   . Ejection fraction     EF, . 60-65%,echo, January, 2012  . S/P aortic valve replacement     2006, bovine pericardial tissue valve /   , Normal function, echo, January, 2012  . Atrial fibrillation     Postoperative, previously treated with amiodarone and Coumadin, then stop  . Carotid artery disease     Bilateral, followed closely by Dr. Imogene Burn ([facility]), February, 2012  . Hx of CABG     2006, aortic valve replacement, ASD closure  . Chest wall pain     October, 2012  . Numbness and tingling in hands     October, 2012    ROS: [x]  Positive   [ ]  Denies    General: [ ]  Weight loss, [ ]  Fever, [ ]  chills Neurologic: [ ]  Dizziness, [ ]  Blackouts, [ ]  Seizure [ ]  Stroke, [ ]  "Mini stroke", [ ]  Slurred speech, [ ]  Temporary blindness; [ ]  weakness in arms or legs, [ ]  Hoarseness Cardiac: [ ]  Chest pain/pressure, [ ]  Shortness of breath at rest [ ]  Shortness of breath with exertion, [ ]  Atrial fibrillation or irregular heartbeat Vascular: [ ]  Pain in legs with walking, [ ]  Pain in legs at rest, [ ]  Pain in legs at night,  [ ]  Non-healing  ulcer, [ ]  Blood clot in vein/DVT,   Pulmonary: [ ]  Home oxygen, [ ]  Productive cough, [ ]  Coughing up blood, [ ]  Asthma,  [ ]  Wheezing Musculoskeletal:  [ ]  Arthritis, [ ]  Low back pain, [ ]  Joint pain Hematologic: [ ]  Easy Bruising, [ ]  Anemia; [ ]  Hepatitis Gastrointestinal: [ ]  Blood in stool, [ ]  Gastroesophageal Reflux/heartburn, [ ]  Trouble swallowing Urinary: [ ]  chronic Kidney disease, [ ]  on HD - [ ]  MWF or [ ]  TTHS, [ ]  Burning with urination, [ ]  Difficulty urinating Skin: [ ]  Rashes, [ ]  Wounds Psychological: [ ]  Anxiety, [ ]  Depression   Social History History  Substance Use Topics  . Smoking status: Former Smoker -- 0.3 packs/day for 40 years    Types: Cigarettes    Quit date: 09/04/2004  . Smokeless tobacco: Never Used  . Alcohol Use: Yes    Family History Family History  Problem Relation Age of Onset  . Heart attack Mother   . Heart failure Father   . COPD Father     No Known Allergies  Current Outpatient Prescriptions  Medication Sig Dispense Refill  . amLODipine (NORVASC) 10 MG tablet Take 10 mg by mouth every  evening.       Marland Kitchen aspirin 81 MG tablet Take 81 mg by mouth every evening.       Marland Kitchen gemfibrozil (LOPID) 600 MG tablet Take 600 mg by mouth 2 (two) times daily.        Marland Kitchen glipiZIDE (GLUCOTROL XL) 10 MG 24 hr tablet Take 10 mg by mouth daily.        . hydrochlorothiazide (HYDRODIURIL) 25 MG tablet Take 25 mg by mouth daily.        . metoprolol tartrate (LOPRESSOR) 25 MG tablet Take 25 mg by mouth 2 (two) times daily.        . Multiple Vitamins-Minerals (PX COMPLETE SENIOR MULTIVITS) TABS Take 1 tablet by mouth daily.        Marland Kitchen oxycodone (OXY-IR) 5 MG capsule Take 5 mg by mouth as needed.          Physical Examination  Filed Vitals:   05/03/12 1158  BP: 136/71  Pulse: 50  Resp:     Body mass index is 32.31 kg/(m^2).  General:  WDWN in NAD Gait: Normal HEENT: WNL Eyes: Pupils equal Pulmonary: normal non-labored breathing , without Rales,  rhonchi,  wheezing Cardiac: RRR, without  Murmurs, rubs or gallops; Abdomen: soft, NT, no masses Skin: no rashes, ulcers noted  Vascular Exam Pulses: 3+ radial pulses bilaterally Carotid bruits: Carotid pulses to auscultation no bruits are heard Extremities without ischemic changes, no Gangrene , no cellulitis; no open wounds;  Musculoskeletal: no muscle wasting or atrophy   Neurologic: A&O X 3; Appropriate Affect ; SENSATION: normal; MOTOR FUNCTION:  moving all extremities equally. Speech is fluent/normal  Non-Invasive Vascular Imaging CAROTID DUPLEX 05/03/2012  Right ICA 20 - 39 % stenosis Left ICA 20 - 39 % stenosis   ASSESSMENT/PLAN: asymptomatic patient with minimal carotid stenosis bilaterally. The patient will followup in one year with repeat carotid duplex. The patient's questions were encouraged and answered, he is in agreement with this plan.  Lauree Chandler ANP   Clinic MD: Imogene Burn

## 2012-07-03 ENCOUNTER — Other Ambulatory Visit: Payer: Self-pay | Admitting: *Deleted

## 2012-07-03 DIAGNOSIS — I6529 Occlusion and stenosis of unspecified carotid artery: Secondary | ICD-10-CM

## 2013-05-02 ENCOUNTER — Other Ambulatory Visit: Payer: Medicare Other

## 2013-05-02 ENCOUNTER — Ambulatory Visit: Payer: Medicare Other | Admitting: Neurosurgery

## 2013-07-28 ENCOUNTER — Encounter: Payer: Self-pay | Admitting: Cardiology

## 2014-12-04 ENCOUNTER — Encounter: Payer: Self-pay | Admitting: Neurology

## 2014-12-04 ENCOUNTER — Ambulatory Visit (INDEPENDENT_AMBULATORY_CARE_PROVIDER_SITE_OTHER): Payer: Medicare HMO | Admitting: Neurology

## 2014-12-04 VITALS — BP 178/82 | HR 75 | Ht 71.0 in | Wt 214.0 lb

## 2014-12-04 DIAGNOSIS — R269 Unspecified abnormalities of gait and mobility: Secondary | ICD-10-CM | POA: Diagnosis not present

## 2014-12-04 DIAGNOSIS — I679 Cerebrovascular disease, unspecified: Secondary | ICD-10-CM | POA: Insufficient documentation

## 2014-12-04 HISTORY — DX: Unspecified abnormalities of gait and mobility: R26.9

## 2014-12-04 HISTORY — DX: Cerebrovascular disease, unspecified: I67.9

## 2014-12-04 NOTE — Progress Notes (Signed)
Reason for visit: Cerebrovascular disease  Referring physician: Dr. Selinda Flavin  Justin Valencia is a 74 y.o. male  History of present illness:  Justin Valencia is a 74 year old white male with a history of cerebrovascular disease. The patient comes in today for neurologic evaluation. The patient has worked as a Naval architect in the past, and he indicates that he was sick during the time he was supposed to get a DOT medical evaluation, and he has lost his license as a Naval architect, he is interested in obtaining the license again. The patient is a poor historian. The records provided by Dr. Dimas Aguas were reviewed. It appears that he has had issues with aortic stenosis, and he has required an aortic valve replacement. He indicates that he had an infection, with endocarditis in 2013, he was quite ill around that time. The patient indicates that he has had a change in his balance and memory since that time. He was reevaluated for some changes in balance and vision in June 2015. MRI of the brain was done at that time showing chronic moderate level small vessel ischemic changes without acute changes seen. A carotid Doppler study was done around that time showing 50-69% stenosis of the left internal carotid artery, antegrade flow in the vertebral arteries, and no stenosis in the right internal carotid artery. The patient has remained on low-dose aspirin. He believes that over the last year his balance has improved. He still has some issues with memory, but this has also gotten some better. The patient denies any focal numbness or weakness of the face, arms, legs. He denies any difficulty controlling the bowels or the bladder. He denies headaches or dizziness. He has had blood work done previously that includes a normal vitamin B12 level. He is sent to this office for further evaluation.  Past Medical History  Diagnosis Date  . CAD, multiple vessel     Stress echo, January , 2012, no ischemia  . Mitral  regurgitation     Echo, January, 2012  . Aortic stenosis     Aortic valve replacement 2006  . ASD secundum     Closure, October, 2006  . Hyperlipidemia     Abnormal LFTs in the past, Lipitor discontinued, followed by Dr. Dimas Aguas he  . Hypertension   . Diabetes mellitus type II   . Chronic back pain     And sternal  . Nephrolithiasis   . Bilateral carotid artery disease   . Ejection fraction     EF, . 60-65%,echo, January, 2012  . S/P aortic valve replacement     2006, bovine pericardial tissue valve /   , Normal function, echo, January, 2012  . Atrial fibrillation     Postoperative, previously treated with amiodarone and Coumadin, then stop  . Carotid artery disease     Bilateral, followed closely by Dr. Imogene Burn ([facility]), February, 2012  . Hx of CABG     2006, aortic valve replacement, ASD closure  . Chest wall pain     October, 2012  . Numbness and tingling in hands     October, 2012  . 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 Cerebrovascular disease 12/04/2014    Past Surgical History  Procedure Laterality Date  . Coronary artery bypass graft  06/2005    LIMA to LAD, SVG to first diagonal, SVG to first obtuse marginal, SVG to PDA  . Aortic valve replacement  06/2005    Bovine pericardial tissue/ Secundum ASD closure  . Inguinal  hernia repair  2002    Bilateral  . Nose surgery    . Vasectomy    . Cholecystectomy      Family History  Problem Relation Age of Onset  . Heart attack Mother   . Heart failure Father   . COPD Father   . Healthy Sister   . Healthy Brother   . Healthy Sister   . Healthy Sister   . Healthy Brother     Social history:  reports that he quit smoking about 10 years ago. His smoking use included Cigarettes. He has a 12 pack-year smoking history. He has never used smokeless tobacco. He reports that he does not drink alcohol or use illicit drugs.  Medications:  Prior to Admission medications   Medication Sig Start Date End Date Taking? Authorizing Provider    aspirin 81 MG tablet Take 81 mg by mouth every evening.    Yes Historical Provider, MD  atorvastatin (LIPITOR) 10 MG tablet Take 10 mg by mouth daily.   Yes Historical Provider, MD  gemfibrozil (LOPID) 600 MG tablet Take 600 mg by mouth 2 (two) times daily.     Yes Historical Provider, MD  Multiple Vitamins-Minerals (PX COMPLETE SENIOR MULTIVITS) TABS Take 1 tablet by mouth daily.     Yes Historical Provider, MD  oxycodone (OXY-IR) 5 MG capsule Take 5 mg by mouth as needed.     Yes Historical Provider, MD     No Known Allergies  ROS:  Out of a complete 14 system review of symptoms, the patient complains only of the following symptoms, and all other reviewed systems are negative.  Ringing in the ears Birthmarks  Blood pressure 178/82, pulse 75, height  (1.803 m), weight 214 lb (97.07 kg).  Physical Exam  General: The patient is alert and cooperative at the time of the examination.  Eyes: Pupils are equal, round, and reactive to light. Discs are flat bilaterally.  Neck: The neck is supple, no carotid bruits are noted.  Respiratory: The respiratory examination is clear.  Cardiovascular: The cardiovascular examination reveals a regular rate and rhythm, no obvious murmurs or rubs are noted.  Skin: Extremities are without significant edema.  Neurologic Exam  Mental status: The patient is alert and oriented x 3 at the time of the examination. The patient has apparent normal recent and remote memory, with an apparently normal attention span and concentration ability. Mini-Mental Status Examination done today shows a total score 21/30.  Cranial nerves: Facial symmetry is present. There is good sensation of the face to pinprick and soft touch bilaterally. The strength of the facial muscles and the muscles to head turning and shoulder shrug are normal bilaterally. Speech is well enunciated, no aphasia or dysarthria is noted. Extraocular movements are full. Visual fields are full. The  tongue is midline, and the patient has symmetric elevation of the soft palate. No obvious hearing deficits are noted.  Motor: The motor testing reveals 5 over 5 strength of all 4 extremities. Good symmetric motor tone is noted throughout.  Sensory: Sensory testing is intact to pinprick, soft touch, vibration sensation, and position sense on all 4 extremities. No evidence of extinction is noted.  Coordination: Cerebellar testing reveals good finger-nose-finger and heel-to-shin bilaterally.  Gait and station: Gait is normal. Tandem gait is minimally unsteady. Romberg is negative. No drift is seen.  Reflexes: Deep tendon reflexes are symmetric, but are depressed bilaterally. Toes are downgoing bilaterally.   Assessment/Plan:  1. Cerebrovascular disease  2. Mild  gait disorder  3. Mild memory disorder  The patient overall seems to be functioning fairly well. The patient has reasonable balance and strength. The patient does have mild cognitive issues, but I am not clear that this would prevent him from operating a motor vehicle. The requirements to operate a truck, are more stringent, however. 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 The memory issue needs to be followed over time, if there is further decline in his cognitive functioning, he may not be able to operate a motor vehicle any sort in the future. The patient will follow-up through this office on an as-needed basis. The carotid Doppler study should be followed on annual basis.  Marlan Palau. Keith Willis MD 12/06/2014 3:32 PM  [facility] [address] [place], [place] [id number]  Phone [phone] Fax [phone]

## 2014-12-04 NOTE — Patient Instructions (Signed)

## 2019-05-16 ENCOUNTER — Other Ambulatory Visit: Payer: Self-pay

## 2019-05-16 ENCOUNTER — Emergency Department (HOSPITAL_COMMUNITY): Payer: Medicare HMO

## 2019-05-16 ENCOUNTER — Encounter (HOSPITAL_COMMUNITY): Payer: Self-pay | Admitting: Oncology

## 2019-05-16 DIAGNOSIS — Z6830 Body mass index (BMI) 30.0-30.9, adult: Secondary | ICD-10-CM

## 2019-05-16 DIAGNOSIS — G8194 Hemiplegia, unspecified affecting left nondominant side: Secondary | ICD-10-CM | POA: Diagnosis not present

## 2019-05-16 DIAGNOSIS — Z8673 Personal history of transient ischemic attack (TIA), and cerebral infarction without residual deficits: Secondary | ICD-10-CM

## 2019-05-16 DIAGNOSIS — E1151 Type 2 diabetes mellitus with diabetic peripheral angiopathy without gangrene: Secondary | ICD-10-CM | POA: Diagnosis present

## 2019-05-16 DIAGNOSIS — Z87891 Personal history of nicotine dependence: Secondary | ICD-10-CM

## 2019-05-16 DIAGNOSIS — I6523 Occlusion and stenosis of bilateral carotid arteries: Secondary | ICD-10-CM | POA: Diagnosis present

## 2019-05-16 DIAGNOSIS — E878 Other disorders of electrolyte and fluid balance, not elsewhere classified: Secondary | ICD-10-CM | POA: Diagnosis present

## 2019-05-16 DIAGNOSIS — I214 Non-ST elevation (NSTEMI) myocardial infarction: Secondary | ICD-10-CM | POA: Diagnosis present

## 2019-05-16 DIAGNOSIS — D696 Thrombocytopenia, unspecified: Secondary | ICD-10-CM | POA: Diagnosis present

## 2019-05-16 DIAGNOSIS — I493 Ventricular premature depolarization: Secondary | ICD-10-CM | POA: Diagnosis not present

## 2019-05-16 DIAGNOSIS — T80818A Extravasation of other vesicant agent, initial encounter: Secondary | ICD-10-CM | POA: Diagnosis not present

## 2019-05-16 DIAGNOSIS — I6340 Cerebral infarction due to embolism of unspecified cerebral artery: Secondary | ICD-10-CM | POA: Insufficient documentation

## 2019-05-16 DIAGNOSIS — I080 Rheumatic disorders of both mitral and aortic valves: Secondary | ICD-10-CM | POA: Diagnosis present

## 2019-05-16 DIAGNOSIS — Z951 Presence of aortocoronary bypass graft: Secondary | ICD-10-CM

## 2019-05-16 DIAGNOSIS — Z9289 Personal history of other medical treatment: Secondary | ICD-10-CM

## 2019-05-16 DIAGNOSIS — Y831 Surgical operation with implant of artificial internal device as the cause of abnormal reaction of the patient, or of later complication, without mention of misadventure at the time of the procedure: Secondary | ICD-10-CM | POA: Diagnosis present

## 2019-05-16 DIAGNOSIS — I110 Hypertensive heart disease with heart failure: Secondary | ICD-10-CM | POA: Diagnosis present

## 2019-05-16 DIAGNOSIS — A419 Sepsis, unspecified organism: Secondary | ICD-10-CM

## 2019-05-16 DIAGNOSIS — I480 Paroxysmal atrial fibrillation: Secondary | ICD-10-CM | POA: Diagnosis present

## 2019-05-16 DIAGNOSIS — T826XXA Infection and inflammatory reaction due to cardiac valve prosthesis, initial encounter: Secondary | ICD-10-CM | POA: Diagnosis not present

## 2019-05-16 DIAGNOSIS — D539 Nutritional anemia, unspecified: Secondary | ICD-10-CM | POA: Diagnosis present

## 2019-05-16 DIAGNOSIS — A4181 Sepsis due to Enterococcus: Secondary | ICD-10-CM | POA: Diagnosis present

## 2019-05-16 DIAGNOSIS — Z8249 Family history of ischemic heart disease and other diseases of the circulatory system: Secondary | ICD-10-CM

## 2019-05-16 DIAGNOSIS — R29712 NIHSS score 12: Secondary | ICD-10-CM | POA: Diagnosis not present

## 2019-05-16 DIAGNOSIS — E785 Hyperlipidemia, unspecified: Secondary | ICD-10-CM | POA: Diagnosis present

## 2019-05-16 DIAGNOSIS — W1811XA Fall from or off toilet without subsequent striking against object, initial encounter: Secondary | ICD-10-CM | POA: Diagnosis present

## 2019-05-16 DIAGNOSIS — I429 Cardiomyopathy, unspecified: Secondary | ICD-10-CM | POA: Diagnosis present

## 2019-05-16 DIAGNOSIS — I5021 Acute systolic (congestive) heart failure: Secondary | ICD-10-CM | POA: Diagnosis present

## 2019-05-16 DIAGNOSIS — J9601 Acute respiratory failure with hypoxia: Secondary | ICD-10-CM

## 2019-05-16 DIAGNOSIS — E872 Acidosis: Secondary | ICD-10-CM | POA: Diagnosis present

## 2019-05-16 DIAGNOSIS — I7101 Dissection of thoracic aorta: Secondary | ICD-10-CM | POA: Diagnosis present

## 2019-05-16 DIAGNOSIS — B952 Enterococcus as the cause of diseases classified elsewhere: Secondary | ICD-10-CM

## 2019-05-16 DIAGNOSIS — R570 Cardiogenic shock: Secondary | ICD-10-CM | POA: Diagnosis present

## 2019-05-16 DIAGNOSIS — I9589 Other hypotension: Secondary | ICD-10-CM

## 2019-05-16 DIAGNOSIS — Z452 Encounter for adjustment and management of vascular access device: Secondary | ICD-10-CM

## 2019-05-16 DIAGNOSIS — I447 Left bundle-branch block, unspecified: Secondary | ICD-10-CM | POA: Diagnosis present

## 2019-05-16 DIAGNOSIS — E663 Overweight: Secondary | ICD-10-CM | POA: Diagnosis present

## 2019-05-16 DIAGNOSIS — N170 Acute kidney failure with tubular necrosis: Secondary | ICD-10-CM | POA: Diagnosis present

## 2019-05-16 DIAGNOSIS — J9690 Respiratory failure, unspecified, unspecified whether with hypoxia or hypercapnia: Secondary | ICD-10-CM

## 2019-05-16 DIAGNOSIS — G8929 Other chronic pain: Secondary | ICD-10-CM | POA: Diagnosis present

## 2019-05-16 DIAGNOSIS — R531 Weakness: Secondary | ICD-10-CM | POA: Diagnosis not present

## 2019-05-16 DIAGNOSIS — E876 Hypokalemia: Secondary | ICD-10-CM | POA: Diagnosis present

## 2019-05-16 DIAGNOSIS — H9190 Unspecified hearing loss, unspecified ear: Secondary | ICD-10-CM | POA: Diagnosis present

## 2019-05-16 DIAGNOSIS — R6521 Severe sepsis with septic shock: Secondary | ICD-10-CM | POA: Diagnosis present

## 2019-05-16 DIAGNOSIS — Z825 Family history of asthma and other chronic lower respiratory diseases: Secondary | ICD-10-CM

## 2019-05-16 DIAGNOSIS — Z20828 Contact with and (suspected) exposure to other viral communicable diseases: Secondary | ICD-10-CM | POA: Diagnosis present

## 2019-05-16 DIAGNOSIS — Z8774 Personal history of (corrected) congenital malformations of heart and circulatory system: Secondary | ICD-10-CM

## 2019-05-16 DIAGNOSIS — I700 Atherosclerosis of aorta: Secondary | ICD-10-CM | POA: Diagnosis present

## 2019-05-16 DIAGNOSIS — I330 Acute and subacute infective endocarditis: Secondary | ICD-10-CM | POA: Diagnosis present

## 2019-05-16 DIAGNOSIS — Y92239 Unspecified place in hospital as the place of occurrence of the external cause: Secondary | ICD-10-CM | POA: Diagnosis present

## 2019-05-16 DIAGNOSIS — I2510 Atherosclerotic heart disease of native coronary artery without angina pectoris: Secondary | ICD-10-CM | POA: Diagnosis present

## 2019-05-16 DIAGNOSIS — Z953 Presence of xenogenic heart valve: Secondary | ICD-10-CM

## 2019-05-16 DIAGNOSIS — G9341 Metabolic encephalopathy: Secondary | ICD-10-CM | POA: Diagnosis present

## 2019-05-16 DIAGNOSIS — Z87442 Personal history of urinary calculi: Secondary | ICD-10-CM

## 2019-05-16 DIAGNOSIS — S225XXA Flail chest, initial encounter for closed fracture: Secondary | ICD-10-CM | POA: Diagnosis not present

## 2019-05-16 DIAGNOSIS — I509 Heart failure, unspecified: Secondary | ICD-10-CM

## 2019-05-16 DIAGNOSIS — I472 Ventricular tachycardia: Secondary | ICD-10-CM | POA: Diagnosis not present

## 2019-05-16 DIAGNOSIS — Y92009 Unspecified place in unspecified non-institutional (private) residence as the place of occurrence of the external cause: Secondary | ICD-10-CM

## 2019-05-16 DIAGNOSIS — Z978 Presence of other specified devices: Secondary | ICD-10-CM

## 2019-05-16 LAB — COMPREHENSIVE METABOLIC PANEL
ALT: 56 U/L — ABNORMAL HIGH (ref 0–44)
AST: 155 U/L — ABNORMAL HIGH (ref 15–41)
Albumin: 3.1 g/dL — ABNORMAL LOW (ref 3.5–5.0)
Alkaline Phosphatase: 58 U/L (ref 38–126)
Anion gap: 14 (ref 5–15)
BUN: 58 mg/dL — ABNORMAL HIGH (ref 8–23)
CO2: 16 mmol/L — ABNORMAL LOW (ref 22–32)
Calcium: 9 mg/dL (ref 8.9–10.3)
Chloride: 102 mmol/L (ref 98–111)
Creatinine, Ser: 2.14 mg/dL — ABNORMAL HIGH (ref 0.61–1.24)
GFR calc Af Amer: 34 mL/min — ABNORMAL LOW (ref >60)
GFR calc non Af Amer: 29 mL/min — ABNORMAL LOW (ref >60)
Glucose, Bld: 145 mg/dL — ABNORMAL HIGH (ref 70–99)
Potassium: 3.1 mmol/L — ABNORMAL LOW (ref 3.5–5.1)
Sodium: 132 mmol/L — ABNORMAL LOW (ref 135–145)
Total Bilirubin: 1.4 mg/dL — ABNORMAL HIGH (ref 0.3–1.2)
Total Protein: 6.6 g/dL (ref 6.5–8.1)

## 2019-05-16 LAB — CBC WITH DIFFERENTIAL/PLATELET
Abs Immature Granulocytes: 0.36 10*3/uL — ABNORMAL HIGH (ref 0.00–0.07)
Basophils Absolute: 0.1 10*3/uL (ref 0.0–0.1)
Basophils Relative: 1 %
Eosinophils Absolute: 0 10*3/uL (ref 0.0–0.5)
Eosinophils Relative: 0 %
HCT: 34.8 % — ABNORMAL LOW (ref 39.0–52.0)
Hemoglobin: 11.7 g/dL — ABNORMAL LOW (ref 13.0–17.0)
Immature Granulocytes: 5 %
Lymphocytes Relative: 8 %
Lymphs Abs: 0.6 10*3/uL — ABNORMAL LOW (ref 0.7–4.0)
MCH: 34.6 pg — ABNORMAL HIGH (ref 26.0–34.0)
MCHC: 33.6 g/dL (ref 30.0–36.0)
MCV: 103 fL — ABNORMAL HIGH (ref 80.0–100.0)
Monocytes Absolute: 0.3 10*3/uL (ref 0.1–1.0)
Monocytes Relative: 4 %
Neutro Abs: 6.3 10*3/uL (ref 1.7–7.7)
Neutrophils Relative %: 82 %
Platelets: 67 10*3/uL — ABNORMAL LOW (ref 150–400)
RBC: 3.38 MIL/uL — ABNORMAL LOW (ref 4.22–5.81)
RDW: 14.5 % (ref 11.5–15.5)
WBC: 7.7 10*3/uL (ref 4.0–10.5)
nRBC: 0 % (ref 0.0–0.2)

## 2019-05-16 LAB — I-STAT CHEM 8, ED
BUN: 52 mg/dL — ABNORMAL HIGH (ref 8–23)
Calcium, Ion: 1.21 mmol/L (ref 1.15–1.40)
Chloride: 103 mmol/L (ref 98–111)
Creatinine, Ser: 2 mg/dL — ABNORMAL HIGH (ref 0.61–1.24)
Glucose, Bld: 143 mg/dL — ABNORMAL HIGH (ref 70–99)
HCT: 37 % — ABNORMAL LOW (ref 39.0–52.0)
Hemoglobin: 12.6 g/dL — ABNORMAL LOW (ref 13.0–17.0)
Potassium: 3 mmol/L — ABNORMAL LOW (ref 3.5–5.1)
Sodium: 135 mmol/L (ref 135–145)
TCO2: 18 mmol/L — ABNORMAL LOW (ref 22–32)

## 2019-05-16 LAB — PROTIME-INR
INR: 1.6 — ABNORMAL HIGH (ref 0.8–1.2)
Prothrombin Time: 19.2 seconds — ABNORMAL HIGH (ref 11.4–15.2)

## 2019-05-16 LAB — MAGNESIUM: Magnesium: 2.4 mg/dL (ref 1.7–2.4)

## 2019-05-16 LAB — TROPONIN I (HIGH SENSITIVITY): Troponin I (High Sensitivity): 14474 ng/L (ref <18)

## 2019-05-16 LAB — LACTIC ACID, PLASMA: Lactic Acid, Venous: 2.1 mmol/L (ref 0.5–1.9)

## 2019-05-16 MED ORDER — SODIUM CHLORIDE 0.9 % IV BOLUS
500.0000 mL | Freq: Once | INTRAVENOUS | Status: AC
  Administered 2019-05-16: 500 mL via INTRAVENOUS

## 2019-05-16 NOTE — ED Triage Notes (Signed)
Pt bib [facility] from home d/t weakness x 5 days.   Denies any other symptoms.

## 2019-05-16 NOTE — ED Provider Notes (Signed)
Emergency Department Provider Note   I have reviewed the triage vital signs and the nursing notes.   HISTORY  Chief Complaint Weakness   HPI Justin Valencia is a 78 y.o. male with PMH of AS, A-fib, CAD, DM presents to the emergency department for evaluation of generalized weakness over the past 5 days.  He states that he has had "problems with my legs" but has difficulty explaining what exactly her means. He tells me that he was on the toilet and trying to wipe himself when he fell to the ground.  He was unable to get up and family called EMS.  EMS report generalized weakness for 5 days but have no additional family report.  Patient denies fever, chills, chest pain, shortness of breath, abdominal or back pain. No changes to medications.   Level 5 caveat: Somnolence   Past Medical History:  Diagnosis Date  . Aortic stenosis    Aortic valve replacement 2006  . ASD secundum    Closure, October, 2006  . Atrial fibrillation (HCC)    Postoperative, previously treated with amiodarone and Coumadin, then stop  . Bilateral carotid artery disease (HCC)   . CAD, multiple vessel    Stress echo, January , 2012, no ischemia  . Carotid artery disease (HCC)    Bilateral, followed closely by Dr. Imogene Burn ([facility]), February, 2012  . Cerebrovascular disease 12/04/2014  . Chest wall pain    October, 2012  . Chronic back pain    And sternal  . Diabetes mellitus type II   . Ejection fraction    EF, . 60-65%,echo, January, 2012  . 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 Hx of CABG    2006, aortic valve replacement, ASD closure  . Hyperlipidemia    Abnormal LFTs in the past, Lipitor discontinued, followed by Dr. Dimas Aguas he  . Hypertension   . Mitral regurgitation    Echo, January, 2012  . Nephrolithiasis   . Numbness and tingling in hands    October, 2012  . S/P aortic valve replacement    2006, bovine pericardial tissue valve /   , Normal function, echo, January, 2012    Patient Active Problem List   Diagnosis Date Noted  . Cardiogenic shock (HCC) 05/17/2019  . 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 Cerebrovascular disease 12/04/2014  . Occlusion and stenosis of carotid artery without mention of cerebral infarction 05/03/2012  . Chest wall pain   . Numbness and tingling in hands   . CAD, multiple vessel   . Mitral regurgitation   . Aortic stenosis   . ASD secundum   . Hyperlipidemia   . Diabetes mellitus type II   . Chronic back pain   . Bilateral carotid artery disease (HCC)   . Ejection fraction   . S/P aortic valve replacement   . Atrial fibrillation (HCC)   . Hx of CABG     Past Surgical History:  Procedure Laterality Date  . AORTIC VALVE REPLACEMENT  06/2005   Bovine pericardial tissue/ Secundum ASD closure  . CHOLECYSTECTOMY    . CORONARY ARTERY BYPASS GRAFT  06/2005   LIMA to LAD, SVG to first diagonal, SVG to first obtuse marginal, SVG to PDA  . INGUINAL HERNIA REPAIR  2002   Bilateral  . NOSE SURGERY    . VASECTOMY      Allergies Sulfamethoxazole  Family History  Problem Relation Age of Onset  . Heart attack Mother   . Heart failure Father   . COPD Father   .  Healthy Sister   . Healthy Brother   . Healthy Sister   . Healthy Sister   . Healthy Brother     Social History Social History   Tobacco Use  . Smoking status: Former Smoker    Packs/day: 0.30    Years: 40.00    Pack years: 12.00    Types: Cigarettes    Quit date: 09/04/2004    Years since quitting: 14.7  . Smokeless tobacco: Never Used  Substance Use Topics  . Alcohol use: No  . Drug use: No    Review of Systems  Constitutional: No fever/chills. Positive generalized weakness.  Eyes: No visual changes. ENT: No sore throat. Cardiovascular: Denies chest pain. Respiratory: Denies shortness of breath. Gastrointestinal: No abdominal pain.  No nausea, no vomiting.  No diarrhea.  No constipation. Genitourinary: Negative for dysuria. Musculoskeletal: Negative for back pain. Skin: Negative for  rash. Neurological: Negative for headaches, focal weakness or numbness.  10-point ROS otherwise negative.  ____________________________________________   PHYSICAL EXAM:  VITAL SIGNS: ED Triage Vitals  Enc Vitals Group     BP 05/25/2019 2002 (!) 83/53     Pulse Rate 05/25/2019 2002 90     Resp 05/30/2019 2002 (!) 26     Temp 05/25/2019 2002 97.6 F (36.4 C)     Temp Source 05/13/2019 2002 Oral     SpO2 06/01/2019 2002 93 %     Weight 05/22/2019 2004 202 lb (91.6 kg)     Height 05/30/2019 2004 5\' 11"  (1.803 m)   Constitutional: Somnolent  No acute distress. Cool extremities to touch.  Eyes: Conjunctivae are normal.  Head: Atraumatic. Nose: No congestion/rhinnorhea. Mouth/Throat: Mucous membranes are moist. Neck: No stridor.  Cardiovascular: Tachycardia. Good peripheral circulation. Grossly normal heart sounds.   Respiratory: Normal respiratory effort.  No retractions. Lungs CTAB. Gastrointestinal: Soft and nontender. No distention.  Musculoskeletal:  No gross deformities of extremities. Neurologic:  Normal speech and language. No gross focal neurologic deficits are appreciated.  Skin:  Skin is cool, dry and intact. No rash noted.  ____________________________________________   LABS (all labs ordered are listed, but only abnormal results are displayed)  Labs Reviewed  COMPREHENSIVE METABOLIC PANEL - Abnormal; Notable for the following components:      Result Value   Sodium 132 (*)    Potassium 3.1 (*)    CO2 16 (*)    Glucose, Bld 145 (*)    BUN 58 (*)    Creatinine, Ser 2.14 (*)    Albumin 3.1 (*)    AST 155 (*)    ALT 56 (*)    Total Bilirubin 1.4 (*)    GFR calc non Af Amer 29 (*)    GFR calc Af Amer 34 (*)    All other components within normal limits  CBC WITH DIFFERENTIAL/PLATELET - Abnormal; Notable for the following components:   RBC 3.38 (*)    Hemoglobin 11.7 (*)    HCT 34.8 (*)    MCV 103.0 (*)    MCH 34.6 (*)    Platelets 67 (*)    Lymphs Abs 0.6 (*)    Abs  Immature Granulocytes 0.36 (*)    All other components within normal limits  PROTIME-INR - Abnormal; Notable for the following components:   Prothrombin Time 19.2 (*)    INR 1.6 (*)    All other components within normal limits  LACTIC ACID, PLASMA - Abnormal; Notable for the following components:   Lactic Acid, Venous 2.1 (*)  All other components within normal limits  SEDIMENTATION RATE - Abnormal; Notable for the following components:   Sed Rate 65 (*)    All other components within normal limits  LACTIC ACID, PLASMA - Abnormal; Notable for the following components:   Lactic Acid, Venous 2.4 (*)    All other components within normal limits  HEMOGLOBIN A1C - Abnormal; Notable for the following components:   Hgb A1c MFr Bld 5.9 (*)    All other components within normal limits  DIC (DISSEMINATED INTRAVASCULAR COAGULATION) PANEL - Abnormal; Notable for the following components:   Prothrombin Time 17.8 (*)    INR 1.5 (*)    Fibrinogen 740 (*)    D-Dimer, Quant 11.08 (*)    Platelets 65 (*)    All other components within normal limits  VITAMIN B12 - Abnormal; Notable for the following components:   Vitamin B-12 1,912 (*)    All other components within normal limits  COMPREHENSIVE METABOLIC PANEL - Abnormal; Notable for the following components:   Potassium 3.3 (*)    CO2 15 (*)    Glucose, Bld 104 (*)    BUN 63 (*)    Creatinine, Ser 1.90 (*)    Calcium 8.8 (*)    Total Protein 6.4 (*)    Albumin 2.9 (*)    AST 156 (*)    ALT 65 (*)    Total Bilirubin 1.6 (*)    GFR calc non Af Amer 34 (*)    GFR calc Af Amer 39 (*)    All other components within normal limits  CBC - Abnormal; Notable for the following components:   RBC 3.45 (*)    Hemoglobin 12.0 (*)    HCT 34.4 (*)    MCH 34.8 (*)    Platelets 62 (*)    All other components within normal limits  LACTIC ACID, PLASMA - Abnormal; Notable for the following components:   Lactic Acid, Venous 2.3 (*)    All other  components within normal limits  I-STAT CHEM 8, ED - Abnormal; Notable for the following components:   Potassium 3.0 (*)    BUN 52 (*)    Creatinine, Ser 2.00 (*)    Glucose, Bld 143 (*)    TCO2 18 (*)    Hemoglobin 12.6 (*)    HCT 37.0 (*)    All other components within normal limits  POCT I-STAT 7, (LYTES, BLD GAS, ICA,H+H) - Abnormal; Notable for the following components:   pCO2 arterial 25.0 (*)    pO2, Arterial 72.0 (*)    Bicarbonate 15.5 (*)    TCO2 16 (*)    Acid-base deficit 8.0 (*)    Potassium 3.0 (*)    HCT 34.0 (*)    Hemoglobin 11.6 (*)    All other components within normal limits  TROPONIN I (HIGH SENSITIVITY) - Abnormal; Notable for the following components:   Troponin I (High Sensitivity) 14,474 (*)    All other components within normal limits  TROPONIN I (HIGH SENSITIVITY) - Abnormal; Notable for the following components:   Troponin I (High Sensitivity) 13,617 (*)    All other components within normal limits  SARS CORONAVIRUS 2 (HOSPITAL ORDER, PERFORMED IN [facility])  RESPIRATORY PANEL BY PCR  MRSA PCR SCREENING  CULTURE, BLOOD (ROUTINE X 2)  CULTURE, BLOOD (ROUTINE X 2)  URINE CULTURE  MAGNESIUM  STREP PNEUMONIAE URINARY ANTIGEN  ETHANOL  RAPID URINE DRUG SCREEN, HOSP PERFORMED  TSH  MAGNESIUM  LACTIC  ACID, PLASMA  URINALYSIS, ROUTINE W REFLEX MICROSCOPIC  HIGH SENSITIVITY CRP  LEGIONELLA PNEUMOPHILA SEROGP 1 UR AG  BLOOD GAS, ARTERIAL  HEPARIN LEVEL (UNFRACTIONATED)  HIV ANTIBODY (ROUTINE TESTING W REFLEX)  HAPTOGLOBIN  TYPE AND SCREEN  ABO/RH   ____________________________________________  EKG   EKG Interpretation  Date/Time:  Friday May 16 2019 20:00:50 EDT Ventricular Rate:  132 PR Interval:    QRS Duration: 212 QT Interval:  408 QTC Calculation: 605 R Axis:   -43 Text Interpretation:  Undetermined rhythm Paired ventricular premature complexes Left bundle branch block Confirmed by Alona BeneLong, Sang Blount [phone]) on  05/10/2019 8:03:10 PM       ____________________________________________  RADIOLOGY  Dg Chest Portable 1 View  Result Date: 05/09/2019 CLINICAL DATA:  Weakness and shortness of breath. EXAM: PORTABLE CHEST 1 VIEW COMPARISON:  T5 14 FINDINGS: Post median sternotomy with aortic valve replacement. Cardiomegaly is similar. Unchanged mediastinal contours with aortic tortuosity. Interstitial coarsening with Kerley B-lines consistent with pulmonary edema. Possible small pleural effusions. No confluent airspace disease. No pneumothorax. Bones are under mineralized. IMPRESSION: Cardiomegaly with mild pulmonary edema. Suspect small pleural effusions. Findings consistent with CHF. Electronically Signed   By: Narda RutherfordMelanie  Sanford M.D.   On: 05/17/2019 20:24    ____________________________________________   PROCEDURES  Procedure(s) performed:   Procedures  CRITICAL CARE Performed by: Maia PlanJoshua G Nelson Noone Total critical care time: 45 minutes Critical care time was exclusive of separately billable procedures and treating other patients. Critical care was necessary to treat or prevent imminent or life-threatening deterioration. Critical care was time spent personally by me on the following activities: development of treatment plan with patient and/or surrogate as well as nursing, discussions with consultants, evaluation of patient's response to treatment, examination of patient, obtaining history from patient or surrogate, ordering and performing treatments and interventions, ordering and review of laboratory studies, ordering and review of radiographic studies, pulse oximetry and re-evaluation of patient's condition.  Alona BeneJoshua Batina Dougan, MD Emergency Medicine    EMERGENCY DEPARTMENT US CARDIAC EXAM "Study: Limited Ultrasound of the heart and pericardium"  INDICATIONS:Abnormal vital signs and Dyspnea Multiple views of the heart and pericardium were obtained in real-time with a multi-frequency probe.  PERFORMED  UE:[id number]:Myself  FINDINGS: Diffuse hypokinesis   LIMITATIONS:  Poor windows  VIEWS USED: Parasternal Hanaa Payes axis and Parasternal short axis  INTERPRETATION: Diffuse hypokinesis and poor contractility. No pericardial effusion.   CPT Code: [phone] (limited transthoracic cardiac)  ____________________________________________   INITIAL IMPRESSION / ASSESSMENT AND PLAN / ED COURSE  Pertinent labs & imaging results that were available during my care of the patient were reviewed by me and considered in my medical decision making (see chart for details).   Patient presents to the emergency department for evaluation of generalized weakness over the past 5 days.  Wife now at bedside reports similar story with no additional information.  Patient is hypotensive no fever.  Somewhat cool extremities.  Suspect cardiogenic type presentation rather than sepsis.   09:55 PM  Spoke with cardiology fellow on-call, Dr. Durene RomansUqowe, regarding the patient's presentation.  I performed a bedside echo and EF appears to be very low.  In care everywhere he had an echo from July of this year showing EF of 55% at that time.  Patient's mental status is unchanged.  He continues to have low blood pressure but when I am in the room a systolic are in the mid 90s. Question missed ischemic event at home in the last several days and resulting heart failure. Cardiology to  evaluate. While I cannot see more recent EKGs, LBBB is described in Care Everywhere documentation.   12:45 AM  Paged Cardiology back. COVID delayed with lab error. Don't expect result for at least an hour. Cards will come down now to see. Appreciate assistance with case.   Discussed patient's case with Cardiology to request admission. Patient and family (if present) updated with plan. Care transferred to Cardiology service.  I reviewed all nursing notes, vitals, pertinent old records, EKGs, labs, imaging (as available).  ____________________________________________   FINAL CLINICAL IMPRESSION(S) / ED DIAGNOSES  Final diagnoses:  Cardiogenic shock ([place])  Generalized weakness  Other specified hypotension     MEDICATIONS GIVEN DURING THIS VISIT:  Medications  pantoprazole (PROTONIX) injection 40 mg (has no administration in time range)  0.9 %  sodium chloride infusion ( Intravenous Rate/Dose Verify 05/17/19 0900)  acetaminophen (TYLENOL) tablet 650 mg (has no administration in time range)  DOBUTamine (DOBUTREX) infusion 4000 mcg/mL (2.5 mcg/kg/min  91.6 kg Intravenous Rate/Dose Verify 05/17/19 0900)  Chlorhexidine Gluconate Cloth 2 % PADS 6 each (has no administration in time range)  MEDLINE mouth rinse (15 mLs Mouth Rinse Given 05/17/19 0344)  insulin aspart (novoLOG) injection 0-9 Units (1 Units Subcutaneous Given 05/17/19 0659)  dextrose 50 % solution 12.5 g (0 g Intravenous Hold 05/17/19 0450)  heparin ADULT infusion 100 units/mL (25000 units/227mL sodium chloride 0.45%) (1,400 Units/hr Intravenous Rate/Dose Verify 05/17/19 0900)  sennosides (SENOKOT) 8.8 MG/5ML syrup 5 mL (has no administration in time range)  potassium chloride 10 mEq in 100 mL IVPB (10 mEq Intravenous New Bag/Given 05/17/19 0904)  sodium chloride 0.9 % bolus 500 mL (0 mLs Intravenous Stopped 05/15/2019 2139)  heparin bolus via infusion 4,000 Units (4,000 Units Intravenous Bolus from Bag 05/17/19 0530)  furosemide (LASIX) injection 40 mg (40 mg Intravenous Given 05/17/19 0518)    Note:  This document was prepared using Dragon voice recognition software and may include unintentional dictation errors.  Nanda Quinton, MD Emergency Medicine    Merideth Bosque, [facility], MD 05/17/19 [phone]

## 2019-05-16 NOTE — ED Notes (Signed)
Paged dr Vickki Muff to Dr Dorothy Puffer

## 2019-05-16 NOTE — ED Notes (Signed)
Dr. Laverta Baltimore notified of critical troponin of 14,474.  V/o to have cardiology paged.

## 2019-05-17 ENCOUNTER — Inpatient Hospital Stay (HOSPITAL_COMMUNITY): Payer: Medicare HMO

## 2019-05-17 DIAGNOSIS — Z951 Presence of aortocoronary bypass graft: Secondary | ICD-10-CM | POA: Diagnosis not present

## 2019-05-17 DIAGNOSIS — J9601 Acute respiratory failure with hypoxia: Secondary | ICD-10-CM | POA: Diagnosis not present

## 2019-05-17 DIAGNOSIS — H9190 Unspecified hearing loss, unspecified ear: Secondary | ICD-10-CM | POA: Diagnosis not present

## 2019-05-17 DIAGNOSIS — N170 Acute kidney failure with tubular necrosis: Secondary | ICD-10-CM | POA: Diagnosis present

## 2019-05-17 DIAGNOSIS — I342 Nonrheumatic mitral (valve) stenosis: Secondary | ICD-10-CM | POA: Diagnosis not present

## 2019-05-17 DIAGNOSIS — N289 Disorder of kidney and ureter, unspecified: Secondary | ICD-10-CM | POA: Diagnosis not present

## 2019-05-17 DIAGNOSIS — H02402 Unspecified ptosis of left eyelid: Secondary | ICD-10-CM | POA: Diagnosis not present

## 2019-05-17 DIAGNOSIS — I639 Cerebral infarction, unspecified: Secondary | ICD-10-CM | POA: Diagnosis not present

## 2019-05-17 DIAGNOSIS — I339 Acute and subacute endocarditis, unspecified: Secondary | ICD-10-CM | POA: Diagnosis not present

## 2019-05-17 DIAGNOSIS — Y92009 Unspecified place in unspecified non-institutional (private) residence as the place of occurrence of the external cause: Secondary | ICD-10-CM | POA: Diagnosis not present

## 2019-05-17 DIAGNOSIS — R570 Cardiogenic shock: Secondary | ICD-10-CM

## 2019-05-17 DIAGNOSIS — I4891 Unspecified atrial fibrillation: Secondary | ICD-10-CM | POA: Diagnosis not present

## 2019-05-17 DIAGNOSIS — E872 Acidosis: Secondary | ICD-10-CM | POA: Diagnosis present

## 2019-05-17 DIAGNOSIS — A4181 Sepsis due to Enterococcus: Secondary | ICD-10-CM | POA: Diagnosis present

## 2019-05-17 DIAGNOSIS — I340 Nonrheumatic mitral (valve) insufficiency: Secondary | ICD-10-CM | POA: Diagnosis not present

## 2019-05-17 DIAGNOSIS — G8194 Hemiplegia, unspecified affecting left nondominant side: Secondary | ICD-10-CM | POA: Diagnosis not present

## 2019-05-17 DIAGNOSIS — I5021 Acute systolic (congestive) heart failure: Secondary | ICD-10-CM | POA: Diagnosis present

## 2019-05-17 DIAGNOSIS — W1811XA Fall from or off toilet without subsequent striking against object, initial encounter: Secondary | ICD-10-CM | POA: Diagnosis present

## 2019-05-17 DIAGNOSIS — I350 Nonrheumatic aortic (valve) stenosis: Secondary | ICD-10-CM | POA: Diagnosis not present

## 2019-05-17 DIAGNOSIS — I429 Cardiomyopathy, unspecified: Secondary | ICD-10-CM | POA: Diagnosis present

## 2019-05-17 DIAGNOSIS — S225XXA Flail chest, initial encounter for closed fracture: Secondary | ICD-10-CM | POA: Diagnosis not present

## 2019-05-17 DIAGNOSIS — Z95828 Presence of other vascular implants and grafts: Secondary | ICD-10-CM | POA: Diagnosis not present

## 2019-05-17 DIAGNOSIS — T80818A Extravasation of other vesicant agent, initial encounter: Secondary | ICD-10-CM | POA: Diagnosis not present

## 2019-05-17 DIAGNOSIS — G9341 Metabolic encephalopathy: Secondary | ICD-10-CM | POA: Diagnosis present

## 2019-05-17 DIAGNOSIS — Y831 Surgical operation with implant of artificial internal device as the cause of abnormal reaction of the patient, or of later complication, without mention of misadventure at the time of the procedure: Secondary | ICD-10-CM | POA: Diagnosis present

## 2019-05-17 DIAGNOSIS — R011 Cardiac murmur, unspecified: Secondary | ICD-10-CM | POA: Diagnosis not present

## 2019-05-17 DIAGNOSIS — Z9911 Dependence on respirator [ventilator] status: Secondary | ICD-10-CM | POA: Diagnosis not present

## 2019-05-17 DIAGNOSIS — E876 Hypokalemia: Secondary | ICD-10-CM | POA: Diagnosis present

## 2019-05-17 DIAGNOSIS — Z20828 Contact with and (suspected) exposure to other viral communicable diseases: Secondary | ICD-10-CM | POA: Diagnosis present

## 2019-05-17 DIAGNOSIS — R6521 Severe sepsis with septic shock: Secondary | ICD-10-CM | POA: Diagnosis present

## 2019-05-17 DIAGNOSIS — I447 Left bundle-branch block, unspecified: Secondary | ICD-10-CM | POA: Diagnosis present

## 2019-05-17 DIAGNOSIS — I6340 Cerebral infarction due to embolism of unspecified cerebral artery: Secondary | ICD-10-CM | POA: Diagnosis not present

## 2019-05-17 DIAGNOSIS — R509 Fever, unspecified: Secondary | ICD-10-CM | POA: Diagnosis not present

## 2019-05-17 DIAGNOSIS — B952 Enterococcus as the cause of diseases classified elsewhere: Secondary | ICD-10-CM | POA: Diagnosis not present

## 2019-05-17 DIAGNOSIS — R7881 Bacteremia: Secondary | ICD-10-CM | POA: Diagnosis not present

## 2019-05-17 DIAGNOSIS — I7101 Dissection of thoracic aorta: Secondary | ICD-10-CM | POA: Diagnosis present

## 2019-05-17 DIAGNOSIS — I214 Non-ST elevation (NSTEMI) myocardial infarction: Secondary | ICD-10-CM

## 2019-05-17 DIAGNOSIS — D696 Thrombocytopenia, unspecified: Secondary | ICD-10-CM | POA: Diagnosis present

## 2019-05-17 DIAGNOSIS — I330 Acute and subacute infective endocarditis: Secondary | ICD-10-CM | POA: Diagnosis present

## 2019-05-17 DIAGNOSIS — I38 Endocarditis, valve unspecified: Secondary | ICD-10-CM | POA: Diagnosis not present

## 2019-05-17 DIAGNOSIS — I472 Ventricular tachycardia: Secondary | ICD-10-CM | POA: Diagnosis not present

## 2019-05-17 DIAGNOSIS — I2510 Atherosclerotic heart disease of native coronary artery without angina pectoris: Secondary | ICD-10-CM | POA: Diagnosis present

## 2019-05-17 DIAGNOSIS — T826XXA Infection and inflammatory reaction due to cardiac valve prosthesis, initial encounter: Secondary | ICD-10-CM | POA: Diagnosis present

## 2019-05-17 DIAGNOSIS — R531 Weakness: Secondary | ICD-10-CM | POA: Diagnosis present

## 2019-05-17 DIAGNOSIS — I509 Heart failure, unspecified: Secondary | ICD-10-CM | POA: Diagnosis not present

## 2019-05-17 DIAGNOSIS — Y92239 Unspecified place in hospital as the place of occurrence of the external cause: Secondary | ICD-10-CM | POA: Diagnosis present

## 2019-05-17 LAB — URINALYSIS, ROUTINE W REFLEX MICROSCOPIC
Bilirubin Urine: NEGATIVE
Glucose, UA: NEGATIVE mg/dL
Ketones, ur: 20 mg/dL — AB
Leukocytes,Ua: NEGATIVE
Nitrite: NEGATIVE
Protein, ur: 100 mg/dL — AB
Specific Gravity, Urine: 1.023 (ref 1.005–1.030)
pH: 5 (ref 5.0–8.0)

## 2019-05-17 LAB — RESPIRATORY PANEL BY PCR

## 2019-05-17 LAB — COOXEMETRY PANEL
Carboxyhemoglobin: 1.1 % (ref 0.5–1.5)
Carboxyhemoglobin: 1.3 % (ref 0.5–1.5)
Methemoglobin: 0.8 % (ref 0.0–1.5)
Methemoglobin: 1.1 % (ref 0.0–1.5)
O2 Saturation: 44.4 %
O2 Saturation: 55 %
Total hemoglobin: 16.3 g/dL — ABNORMAL HIGH (ref 12.0–16.0)
Total hemoglobin: 10.1 g/dL — ABNORMAL LOW (ref 12.0–16.0)

## 2019-05-17 LAB — COMPREHENSIVE METABOLIC PANEL
ALT: 65 U/L — ABNORMAL HIGH (ref 0–44)
AST: 156 U/L — ABNORMAL HIGH (ref 15–41)
Albumin: 2.9 g/dL — ABNORMAL LOW (ref 3.5–5.0)
Alkaline Phosphatase: 56 U/L (ref 38–126)
Anion gap: 15 (ref 5–15)
BUN: 63 mg/dL — ABNORMAL HIGH (ref 8–23)
CO2: 15 mmol/L — ABNORMAL LOW (ref 22–32)
Calcium: 8.8 mg/dL — ABNORMAL LOW (ref 8.9–10.3)
Chloride: 106 mmol/L (ref 98–111)
Creatinine, Ser: 1.9 mg/dL — ABNORMAL HIGH (ref 0.61–1.24)
GFR calc Af Amer: 39 mL/min — ABNORMAL LOW (ref >60)
GFR calc non Af Amer: 34 mL/min — ABNORMAL LOW (ref >60)
Glucose, Bld: 104 mg/dL — ABNORMAL HIGH (ref 70–99)
Potassium: 3.3 mmol/L — ABNORMAL LOW (ref 3.5–5.1)
Sodium: 136 mmol/L (ref 135–145)
Total Bilirubin: 1.6 mg/dL — ABNORMAL HIGH (ref 0.3–1.2)
Total Protein: 6.4 g/dL — ABNORMAL LOW (ref 6.5–8.1)

## 2019-05-17 LAB — TYPE AND SCREEN
ABO/RH(D): A POS
Antibody Screen: NEGATIVE

## 2019-05-17 LAB — BLOOD CULTURE ID PANEL (REFLEXED)

## 2019-05-17 LAB — RAPID URINE DRUG SCREEN, HOSP PERFORMED
Amphetamines: NOT DETECTED
Barbiturates: NOT DETECTED
Benzodiazepines: NOT DETECTED
Cocaine: NOT DETECTED
Opiates: NOT DETECTED
Tetrahydrocannabinol: NOT DETECTED

## 2019-05-17 LAB — POCT I-STAT 7, (LYTES, BLD GAS, ICA,H+H)
Acid-base deficit: 8 mmol/L — ABNORMAL HIGH (ref 0.0–2.0)
Acid-base deficit: 9 mmol/L — ABNORMAL HIGH (ref 0.0–2.0)
Bicarbonate: 15.5 mmol/L — ABNORMAL LOW (ref 20.0–28.0)
Bicarbonate: 14.4 mmol/L — ABNORMAL LOW (ref 20.0–28.0)
Calcium, Ion: 1.25 mmol/L (ref 1.15–1.40)
Calcium, Ion: 1.29 mmol/L (ref 1.15–1.40)
HCT: 34 % — ABNORMAL LOW (ref 39.0–52.0)
HCT: 32 % — ABNORMAL LOW (ref 39.0–52.0)
Hemoglobin: 11.6 g/dL — ABNORMAL LOW (ref 13.0–17.0)
Hemoglobin: 10.9 g/dL — ABNORMAL LOW (ref 13.0–17.0)
O2 Saturation: 95 %
O2 Saturation: 95 %
Patient temperature: 97.3
Patient temperature: 37
Potassium: 3 mmol/L — ABNORMAL LOW (ref 3.5–5.1)
Potassium: 3 mmol/L — ABNORMAL LOW (ref 3.5–5.1)
Sodium: 135 mmol/L (ref 135–145)
Sodium: 138 mmol/L (ref 135–145)
TCO2: 16 mmol/L — ABNORMAL LOW (ref 22–32)
TCO2: 15 mmol/L — ABNORMAL LOW (ref 22–32)
pCO2 arterial: 25 mmHg — ABNORMAL LOW (ref 32.0–48.0)
pCO2 arterial: 22.5 mmHg — ABNORMAL LOW (ref 32.0–48.0)
pH, Arterial: 7.397 (ref 7.350–7.450)
pH, Arterial: 7.412 (ref 7.350–7.450)
pO2, Arterial: 72 mmHg — ABNORMAL LOW (ref 83.0–108.0)
pO2, Arterial: 73 mmHg — ABNORMAL LOW (ref 83.0–108.0)

## 2019-05-17 LAB — HEMOGLOBIN A1C
Hgb A1c MFr Bld: 5.9 % — ABNORMAL HIGH (ref 4.8–5.6)
Mean Plasma Glucose: 122.63 mg/dL

## 2019-05-17 LAB — ABO/RH: ABO/RH(D): A POS

## 2019-05-17 LAB — SARS CORONAVIRUS 2 BY RT PCR (HOSPITAL ORDER, PERFORMED IN ~~LOC~~ HOSPITAL LAB): SARS Coronavirus 2: NEGATIVE

## 2019-05-17 LAB — MRSA PCR SCREENING: MRSA by PCR: NEGATIVE

## 2019-05-17 LAB — GLUCOSE, CAPILLARY
Glucose-Capillary: 101 mg/dL — ABNORMAL HIGH (ref 70–99)
Glucose-Capillary: 129 mg/dL — ABNORMAL HIGH (ref 70–99)

## 2019-05-17 LAB — ECHOCARDIOGRAM COMPLETE
Height: 71 in
Weight: 3276.92 oz

## 2019-05-17 LAB — DIC (DISSEMINATED INTRAVASCULAR COAGULATION)PANEL
D-Dimer, Quant: 11.08 ug/mL-FEU — ABNORMAL HIGH (ref 0.00–0.50)
Fibrinogen: 740 mg/dL — ABNORMAL HIGH (ref 210–475)
INR: 1.5 — ABNORMAL HIGH (ref 0.8–1.2)
Platelets: 65 10*3/uL — ABNORMAL LOW (ref 150–400)
Prothrombin Time: 17.8 seconds — ABNORMAL HIGH (ref 11.4–15.2)
Smear Review: NONE SEEN
aPTT: 32 seconds (ref 24–36)

## 2019-05-17 LAB — LACTATE DEHYDROGENASE: LDH: 411 U/L — ABNORMAL HIGH (ref 98–192)

## 2019-05-17 LAB — CBC
HCT: 34.4 % — ABNORMAL LOW (ref 39.0–52.0)
Hemoglobin: 12 g/dL — ABNORMAL LOW (ref 13.0–17.0)
MCH: 34.8 pg — ABNORMAL HIGH (ref 26.0–34.0)
MCHC: 34.9 g/dL (ref 30.0–36.0)
MCV: 99.7 fL (ref 80.0–100.0)
Platelets: 62 10*3/uL — ABNORMAL LOW (ref 150–400)
RBC: 3.45 MIL/uL — ABNORMAL LOW (ref 4.22–5.81)
RDW: 14.6 % (ref 11.5–15.5)
WBC: 5.7 10*3/uL (ref 4.0–10.5)
nRBC: 0 % (ref 0.0–0.2)

## 2019-05-17 LAB — MAGNESIUM: Magnesium: 2.3 mg/dL (ref 1.7–2.4)

## 2019-05-17 LAB — VITAMIN B12: Vitamin B-12: 1912 pg/mL — ABNORMAL HIGH (ref 180–914)

## 2019-05-17 LAB — STREP PNEUMONIAE URINARY ANTIGEN: Strep Pneumo Urinary Antigen: NEGATIVE

## 2019-05-17 LAB — LACTIC ACID, PLASMA
Lactic Acid, Venous: 2.4 mmol/L (ref 0.5–1.9)
Lactic Acid, Venous: 2.3 mmol/L (ref 0.5–1.9)
Lactic Acid, Venous: 1.7 mmol/L (ref 0.5–1.9)
Lactic Acid, Venous: 1.5 mmol/L (ref 0.5–1.9)
Lactic Acid, Venous: 1.4 mmol/L (ref 0.5–1.9)

## 2019-05-17 LAB — TSH: TSH: 0.637 u[IU]/mL (ref 0.350–4.500)

## 2019-05-17 LAB — SEDIMENTATION RATE: Sed Rate: 65 mm/hr — ABNORMAL HIGH (ref 0–16)

## 2019-05-17 LAB — HEPARIN LEVEL (UNFRACTIONATED): Heparin Unfractionated: <0.1 IU/mL — ABNORMAL LOW (ref 0.30–0.70)

## 2019-05-17 LAB — TROPONIN I (HIGH SENSITIVITY): Troponin I (High Sensitivity): 13617 ng/L (ref <18)

## 2019-05-17 LAB — ETHANOL: Alcohol, Ethyl (B): <10 mg/dL (ref <10)

## 2019-05-17 MED ORDER — POTASSIUM CHLORIDE 10 MEQ/100ML IV SOLN
10.0000 meq | INTRAVENOUS | Status: AC
  Administered 2019-05-17 (×4): 10 meq via INTRAVENOUS
  Filled 2019-05-17 (×4): qty 100

## 2019-05-17 MED ORDER — HEPARIN SODIUM (PORCINE) 5000 UNIT/ML IJ SOLN: 5000.0000 [IU] | Freq: Three times a day (TID) | INTRAMUSCULAR | Status: DC

## 2019-05-17 MED ORDER — HEPARIN (PORCINE) 25000 UT/250ML-% IV SOLN
1400.0000 [IU]/h | INTRAVENOUS | Status: DC
  Administered 2019-05-17: 1400 [IU]/h via INTRAVENOUS
  Filled 2019-05-17: qty 250

## 2019-05-17 MED ORDER — ACETAMINOPHEN 650 MG RE SUPP
650.0000 mg | RECTAL | Status: DC | PRN
  Administered 2019-05-17 – 2019-05-19 (×2): 650 mg via RECTAL
  Filled 2019-05-17 (×2): qty 1

## 2019-05-17 MED ORDER — SODIUM CHLORIDE 0.9 % IV SOLN
2.0000 g | Freq: Three times a day (TID) | INTRAVENOUS | Status: DC
  Administered 2019-05-17 – 2019-05-18 (×3): 2 g via INTRAVENOUS
  Filled 2019-05-17 (×5): qty 2

## 2019-05-17 MED ORDER — INFLUENZA VAC A&B SA ADJ QUAD 0.5 ML IM PRSY: 0.5000 mL | PREFILLED_SYRINGE | INTRAMUSCULAR | Status: DC | PRN

## 2019-05-17 MED ORDER — HEPARIN (PORCINE) 25000 UT/250ML-% IV SOLN: 12.0000 [IU]/kg/h | INTRAVENOUS | Status: DC

## 2019-05-17 MED ORDER — SODIUM CHLORIDE 0.9 % IV SOLN
250.0000 mL | INTRAVENOUS | Status: DC
  Administered 2019-05-17 (×2): 250 mL via INTRAVENOUS

## 2019-05-17 MED ORDER — PANTOPRAZOLE SODIUM 40 MG IV SOLR
40.0000 mg | Freq: Every day | INTRAVENOUS | Status: DC
  Administered 2019-05-17 – 2019-05-26 (×10): 40 mg via INTRAVENOUS
  Filled 2019-05-17 (×10): qty 40

## 2019-05-17 MED ORDER — SODIUM CHLORIDE 0.9 % IV SOLN
2.0000 g | INTRAVENOUS | Status: DC
  Filled 2019-05-17: qty 20

## 2019-05-17 MED ORDER — DEXTROSE 50 % IV SOLN: 12.5000 g | INTRAVENOUS | Status: AC

## 2019-05-17 MED ORDER — VANCOMYCIN HCL 10 G IV SOLR
1500.0000 mg | Freq: Once | INTRAVENOUS | Status: AC
  Administered 2019-05-17: 1500 mg via INTRAVENOUS
  Filled 2019-05-17: qty 1500

## 2019-05-17 MED ORDER — AMIODARONE HCL IN DEXTROSE 360-4.14 MG/200ML-% IV SOLN
60.0000 mg/h | INTRAVENOUS | Status: AC
  Administered 2019-05-17 (×2): 60 mg/h via INTRAVENOUS
  Filled 2019-05-17 (×2): qty 200

## 2019-05-17 MED ORDER — CHLORHEXIDINE GLUCONATE CLOTH 2 % EX PADS: 6.0000 | MEDICATED_PAD | Freq: Every day | CUTANEOUS | Status: DC

## 2019-05-17 MED ORDER — VANCOMYCIN HCL 10 G IV SOLR
1250.0000 mg | INTRAVENOUS | Status: DC
  Filled 2019-05-17: qty 1250

## 2019-05-17 MED ORDER — SODIUM BICARBONATE 8.4 % IV SOLN
INTRAVENOUS | Status: AC
  Filled 2019-05-17: qty 50

## 2019-05-17 MED ORDER — SODIUM CHLORIDE 0.9 % IV SOLN
INTRAVENOUS | Status: DC | PRN
  Administered 2019-05-17: 10 mL via INTRAVENOUS
  Administered 2019-05-25: 250 mL via INTRAVENOUS

## 2019-05-17 MED ORDER — HEPARIN BOLUS VIA INFUSION
4000.0000 [IU] | Freq: Once | INTRAVENOUS | Status: AC
  Administered 2019-05-17: 4000 [IU] via INTRAVENOUS
  Filled 2019-05-17: qty 4000

## 2019-05-17 MED ORDER — CHLORHEXIDINE GLUCONATE 0.12 % MT SOLN
15.0000 mL | Freq: Two times a day (BID) | OROMUCOSAL | Status: DC
  Administered 2019-05-17 – 2019-05-20 (×6): 15 mL via OROMUCOSAL

## 2019-05-17 MED ORDER — DOBUTAMINE IN D5W 4-5 MG/ML-% IV SOLN
2.5000 ug/kg/min | INTRAVENOUS | Status: DC
  Administered 2019-05-17: 2.5 ug/kg/min via INTRAVENOUS
  Filled 2019-05-17: qty 250

## 2019-05-17 MED ORDER — ORAL CARE MOUTH RINSE
15.0000 mL | Freq: Two times a day (BID) | OROMUCOSAL | Status: DC
  Administered 2019-05-17 – 2019-05-20 (×8): 15 mL via OROMUCOSAL

## 2019-05-17 MED ORDER — HEPARIN (PORCINE) 25000 UT/250ML-% IV SOLN
1900.0000 [IU]/h | INTRAVENOUS | Status: DC
  Administered 2019-05-17: 18:00:00 1600 [IU]/h via INTRAVENOUS
  Administered 2019-05-18: 1750 [IU]/h via INTRAVENOUS
  Administered 2019-05-18 – 2019-05-19 (×2): 1900 [IU]/h via INTRAVENOUS
  Filled 2019-05-17 (×3): qty 250

## 2019-05-17 MED ORDER — SODIUM BICARBONATE 8.4 % IV SOLN
50.0000 meq | Freq: Once | INTRAVENOUS | Status: AC
  Administered 2019-05-17: 23:00:00 50 meq via INTRAVENOUS

## 2019-05-17 MED ORDER — AMIODARONE LOAD VIA INFUSION
150.0000 mg | Freq: Once | INTRAVENOUS | Status: AC
  Administered 2019-05-17: 150 mg via INTRAVENOUS
  Filled 2019-05-17: qty 83.34

## 2019-05-17 MED ORDER — ACETAMINOPHEN 325 MG PO TABS: 650.0000 mg | ORAL_TABLET | ORAL | Status: DC | PRN

## 2019-05-17 MED ORDER — NOREPINEPHRINE 16 MG/250ML-% IV SOLN
0.0000 ug/min | INTRAVENOUS | Status: DC
  Administered 2019-05-17: 5 ug/min via INTRAVENOUS
  Administered 2019-05-20: 17 ug/min via INTRAVENOUS
  Administered 2019-05-21: 16:00:00 18 ug/min via INTRAVENOUS
  Administered 2019-05-22: 13 ug/min via INTRAVENOUS
  Administered 2019-05-23: 30 ug/min via INTRAVENOUS
  Administered 2019-05-23: 40 ug/min via INTRAVENOUS
  Administered 2019-05-24: 14 ug/min via INTRAVENOUS
  Administered 2019-05-25: 5 ug/min via INTRAVENOUS
  Filled 2019-05-17 (×9): qty 250

## 2019-05-17 MED ORDER — SODIUM BICARBONATE-DEXTROSE 150-5 MEQ/L-% IV SOLN
150.0000 meq | INTRAVENOUS | Status: DC
  Administered 2019-05-17 – 2019-05-18 (×2): 150 meq via INTRAVENOUS
  Filled 2019-05-17 (×2): qty 1000

## 2019-05-17 MED ORDER — SENNOSIDES 8.8 MG/5ML PO SYRP: 5.0000 mL | ORAL_SOLUTION | Freq: Two times a day (BID) | ORAL | Status: DC | PRN

## 2019-05-17 MED ORDER — AMIODARONE HCL IN DEXTROSE 360-4.14 MG/200ML-% IV SOLN
30.0000 mg/h | INTRAVENOUS | Status: DC
  Administered 2019-05-17: 30 mg/h via INTRAVENOUS
  Administered 2019-05-18: 60 mg/h via INTRAVENOUS
  Administered 2019-05-18: 30 mg/h via INTRAVENOUS
  Administered 2019-05-18 – 2019-05-19 (×3): 60 mg/h via INTRAVENOUS
  Administered 2019-05-20 – 2019-05-22 (×7): 30 mg/h via INTRAVENOUS
  Filled 2019-05-17 (×13): qty 200

## 2019-05-17 MED ORDER — FUROSEMIDE 10 MG/ML IJ SOLN
40.0000 mg | Freq: Once | INTRAMUSCULAR | Status: AC
  Administered 2019-05-17: 40 mg via INTRAVENOUS
  Filled 2019-05-17: qty 4

## 2019-05-17 MED ORDER — INSULIN ASPART 100 UNIT/ML ~~LOC~~ SOLN
0.0000 [IU] | Freq: Four times a day (QID) | SUBCUTANEOUS | Status: DC
  Administered 2019-05-17 (×2): 1 [IU] via SUBCUTANEOUS
  Administered 2019-05-18 (×2): 2 [IU] via SUBCUTANEOUS
  Administered 2019-05-18: 3 [IU] via SUBCUTANEOUS
  Administered 2019-05-18 – 2019-05-20 (×7): 2 [IU] via SUBCUTANEOUS
  Administered 2019-05-20: 06:00:00 1 [IU] via SUBCUTANEOUS

## 2019-05-17 MED ORDER — CHLORHEXIDINE GLUCONATE CLOTH 2 % EX PADS
6.0000 | MEDICATED_PAD | Freq: Every day | CUTANEOUS | Status: DC
  Administered 2019-05-17 – 2019-05-26 (×9): 6 via TOPICAL

## 2019-05-17 MED ORDER — ORAL CARE MOUTH RINSE
15.0000 mL | Freq: Two times a day (BID) | OROMUCOSAL | Status: DC
  Administered 2019-05-18 – 2019-05-20 (×4): 15 mL via OROMUCOSAL

## 2019-05-17 NOTE — Progress Notes (Signed)
PHARMACY - PHYSICIAN COMMUNICATION CRITICAL VALUE ALERT - BLOOD CULTURE IDENTIFICATION (BCID)  Justin Valencia is an 78 y.o. male who presented to [facility] on 06/07/2019 with a chief complaint of fatigue and noted with cardiogenic shock.   Assessment:  Cardiogenic shock with fever. Blood cultures show GPC in paits and clusters in 2/2 bottles and BCID shows enterococcus   Name of physician (or Provider) Contacted: Dr. Vickki Muff  Current antibiotics: vancomycin and cefepime  Changes to prescribed antibiotics recommended:  No changes now. Monitor progress and may narrow later   Results for orders placed or performed during the hospital encounter of 2019-06-07  Blood Culture ID Panel (Reflexed) (Collected: 05/17/2019  2:44 AM)  Result Value Ref Range   Enterococcus species DETECTED (A) NOT DETECTED   Vancomycin resistance NOT DETECTED NOT DETECTED   Listeria monocytogenes NOT DETECTED NOT DETECTED   Staphylococcus species NOT DETECTED NOT DETECTED   Staphylococcus aureus (BCID) NOT DETECTED NOT DETECTED   Streptococcus species NOT DETECTED NOT DETECTED   Streptococcus agalactiae NOT DETECTED NOT DETECTED   Streptococcus pneumoniae NOT DETECTED NOT DETECTED   Streptococcus pyogenes NOT DETECTED NOT DETECTED   Acinetobacter baumannii NOT DETECTED NOT DETECTED   Enterobacteriaceae species NOT DETECTED NOT DETECTED   Enterobacter cloacae complex NOT DETECTED NOT DETECTED   Escherichia coli NOT DETECTED NOT DETECTED   Klebsiella oxytoca NOT DETECTED NOT DETECTED   Klebsiella pneumoniae NOT DETECTED NOT DETECTED   Proteus species NOT DETECTED NOT DETECTED   Serratia marcescens NOT DETECTED NOT DETECTED   Haemophilus influenzae NOT DETECTED NOT DETECTED   Neisseria meningitidis NOT DETECTED NOT DETECTED   Pseudomonas aeruginosa NOT DETECTED NOT DETECTED   Candida albicans NOT DETECTED NOT DETECTED   Candida glabrata NOT DETECTED NOT DETECTED   Candida krusei NOT DETECTED NOT DETECTED   Candida parapsilosis NOT DETECTED NOT DETECTED   Candida tropicalis NOT DETECTED NOT DETECTED    Hildred Laser, PharmD Clinical Pharmacist **Pharmacist phone directory can now be found on [email or web address] ([email or web address]).  Listed under [facility].

## 2019-05-17 NOTE — H&P (Addendum)
CARDIOLOGY ADMISSION NOTE  Patient ID: Justin Valencia MRN: [id number] DOB/AGE: September 21, 1940 78 y.o.  Admit date: 05/20/2019 Primary Cardiologist: [facility] of [facility]    Chief Complaint   Fatigue x 5 days  HPI:    Justin Valencia is a 78 year old gentleman with CAD s/p CABG (2006; LIMA to LAD, SVG to D1, SVG to OM1, SVG to PDA); h/o AS s/p SAVR (2006) and TAVR (2016), h/o ASD s/p patch closure (2006), HTN, dyslipidemia, prior CVA, h/o infective endocarditis of the mitral valve, and DMII who presents to the ED with approximately five days of weakness and shortness of breath.   Much of history obtained from available chart records, as patient is quite somnolent and not fully able to participate in interview (attempted to reach patient's wife but she was unavailable).  Justin Valencia endorses several days of progressive weakness/fatigue.  This culminated to the point that he fell off of the toilet and was unable to lift himself up off the floor even with the assistance of his family.  EMS was called and patient was brought to [facility] ED.  In the ED, showed vital signs showed that patient was afebrile, with BP 83/53, heart rate in the 110-130s.  EKG showed what appeared to be atrial fibrillation with aberrancy in a left bundle branch block pattern (LBBB was seen on ECG back in 2017, so this was not new).  Labs were notable for a sodium of 132, potassium 3.1, bicarb 16, serum creatinine 2.14 (unclear recent baseline, last available creatinine in our records was normal several years ago), AST/ALT were elevated (155/56) with a mildly elevated T bili and normal alk phos.  INR was 1.6.  CBC revealed a normal white count, hemoglobin 11.7, and platelets 67 (normal several years ago). Initial troponin was 14,474, which down trended to 13, 617 approximately 3 hours later.  Initial lactate was 2.1.  COVID PCR was negative.   Portable chest x-ray showed cardiomegaly and small bilateral pleural effusions.  POC ultrasound  performed by ED and myself separately revealed a markedly depressed EF with global hypocontractility, although windows and image quality were poor.  Notably, in care everywhere patient had a TTE performed at [facility] in 03/2019 that showed a normal EF (full report copied below),   At the time of my interview, patient denied fever/chills, chest pain, palpitations, headache/lightheadedness/dizziness, nausea or vomiting, diarrhea/constipation.  He did endorse some mild shortness of breath.    Past Medical History:  Diagnosis Date  . Aortic stenosis    Aortic valve replacement 2006  . ASD secundum    Closure, October, 2006  . Atrial fibrillation (HCC)    Postoperative, previously treated with amiodarone and Coumadin, then stop  . Bilateral carotid artery disease ([place])   . CAD, multiple vessel    Stress echo, January , 2012, no ischemia  . Carotid artery disease (HCC)    Bilateral, followed closely by Dr. Bridgett Larsson ([facility]), February, 2012  . Cerebrovascular disease 12/04/2014  . Chest wall pain    October, 2012  . Chronic back pain    And sternal  . Diabetes mellitus type II   . Ejection fraction    EF, . 60-65%,echo, January, 2012  . 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 Hx of CABG    2006, aortic valve replacement, ASD closure  . Hyperlipidemia    Abnormal LFTs in the past, Lipitor discontinued, followed by Dr. Nadara Mustard he  . Hypertension   . Mitral regurgitation    Echo,  January, 2012  . Nephrolithiasis   . Numbness and tingling in hands    October, 2012  . S/P aortic valve replacement    2006, bovine pericardial tissue valve /   , Normal function, echo, January, 2012    Past Surgical History:  Procedure Laterality Date  . AORTIC VALVE REPLACEMENT  06/2005   Bovine pericardial tissue/ Secundum ASD closure  . CHOLECYSTECTOMY    . CORONARY ARTERY BYPASS GRAFT  06/2005   LIMA to LAD, SVG to first diagonal, SVG to first obtuse marginal, SVG to PDA  . INGUINAL HERNIA REPAIR  2002   Bilateral   . NOSE SURGERY    . VASECTOMY      Allergies  Allergen Reactions  . Sulfamethoxazole Rash    Reported by [facility] in 2016   No current facility-administered medications on file prior to encounter.    Current Outpatient Medications on File Prior to Encounter  Medication Sig Dispense Refill  . acetaminophen (TYLENOL) 500 MG tablet Take 1,000 mg by mouth every 6 (six) hours as needed for headache (pain).    Marland Kitchen amLODipine (NORVASC) 5 MG tablet Take 5 mg by mouth daily.    Marland Kitchen aspirin EC 81 MG tablet Take 81 mg by mouth at bedtime.    Marland Kitchen atorvastatin (LIPITOR) 10 MG tablet Take 10 mg by mouth at bedtime.     Marland Kitchen gemfibrozil (LOPID) 600 MG tablet Take 600 mg by mouth 2 (two) times daily.      Marland Kitchen glipiZIDE (GLUCOTROL XL) 10 MG 24 hr tablet Take 10 mg by mouth daily with breakfast.    . Magnesium 250 MG TABS Take 250 mg by mouth at bedtime.     . Multiple Vitamin (MULTIVITAMIN WITH MINERALS) TABS tablet Take 1 tablet by mouth daily.    Marland Kitchen OVER THE COUNTER MEDICATION Take 1 tablet by mouth at bedtime. Zinc 30 mg/ copper 2 mg     Social History   Socioeconomic History  . Marital status: Married    Spouse name: Not on file  . Number of children: 3  . Years of education: 63  . Highest education level: Not on file  Occupational History  . Occupation: Truck Education administrator: [employer]  . Financial resource strain: Not on file  . Food insecurity    Worry: Not on file    Inability: Not on file  . Transportation needs    Medical: Not on file    Non-medical: Not on file  Tobacco Use  . Smoking status: Former Smoker    Packs/day: 0.30    Years: 40.00    Pack years: 12.00    Types: Cigarettes    Quit date: 09/04/2004    Years since quitting: 14.7  . Smokeless tobacco: Never Used  Substance and Sexual Activity  . Alcohol use: No  . Drug use: No  . Sexual activity: Not on file  Lifestyle  . Physical activity    Days per week: Not on file    Minutes  per session: Not on file  . Stress: Not on file  Relationships  . Social Herbalist on phone: Not on file    Gets together: Not on file    Attends religious service: Not on file    Active member of club or organization: Not on file    Attends meetings of clubs or organizations: Not on file    Relationship status: Not on file  .  Intimate partner violence    Fear of current or ex partner: Not on file    Emotionally abused: Not on file    Physically abused: Not on file    Forced sexual activity: Not on file  Other Topics Concern  . Not on file  Social History Narrative   Married with 3 grown children   Patient is right handed.   Patient drinks 1 cup of coffee daily.    Family History  Problem Relation Age of Onset  . Heart attack Mother   . Heart failure Father   . COPD Father   . Healthy Sister   . Healthy Brother   . Healthy Sister   . Healthy Sister   . Healthy Brother       Review of Systems: Unable to fully obtain due to patient's altered mental status and somnolence.   Physical Exam: Blood pressure 98/67, pulse 70, temperature 97.6 F (36.4 C), temperature source Oral, resp. rate (!) 29, height '5\' 11"'  (1.803 m), weight 91.6 kg, SpO2 95 %.   GENERAL: Patient is afebrile, Vital signs reviewed, he does not appear to be in any pain, but he looks unwell with eyes closed and intermittently able to answer questions CARD: elevated JVD at 45 deg, irregular rate and rhythm, tachy. No m/r/g.  RESP:  Tachypneic (approx 25 breaths per min), bibasilar inspiratory crackles, no wheezing or rhonchi,  ABD: soft, very mildly tender to palpation, BS present. EXT:  trace pedal and pretibial edema, distal legs and forearms are cool to the touch. NEURO: somnolent, but easily rouses to voice. No focal neurologic deficits.   Labs: Lab Results  Component Value Date   BUN 52 (H) 06/03/2019   Lab Results  Component Value Date   CREATININE 2.00 (H) 05/28/2019   Lab Results   Component Value Date   NA 135 05/28/2019   K 3.0 (L) 05/17/2019   CL 103 05/15/2019   CO2 16 (L) 05/14/2019   No results found for: TROPONINI Lab Results  Component Value Date   WBC 7.7 05/27/2019   HGB 12.6 (L) 05/13/2019   HCT 37.0 (L) 05/25/2019   MCV 103.0 (H) 05/19/2019   PLT 67 (L) 05/11/2019   No results found for: CHOL, HDL, LDLCALC, LDLDIRECT, TRIG, CHOLHDL Lab Results  Component Value Date   ALT 56 (H) 05/28/2019   AST 155 (H) 05/13/2019   ALKPHOS 58 06/02/2019   BILITOT 1.4 (H) 05/25/2019      Radiology:    CXR (05/11/2019):   IMPRESSION: Cardiomegaly with mild pulmonary edema. Suspect small pleural effusions. Findings consistent with CHF.  EKG:  AF with aberrancy, LBBB  ASSESSMENT AND PLAN:   Justin Valencia is a 78 year old gentleman with CAD s/p CABG (2006; LIMA to LAD, SVG to D1, SVG to OM1, SVG to PDA); h/o AS s/p SAVR (2006) and TAVR (2016), h/o ASD s/p patch closure (2006), HTN, dyslipidemia, prior CVA, h/o infective endocarditis of the mitral valve, and DMII who presents to the ED with approximately five days of weakness and shortness of breath.   Patient presenting with what appears to be acute onset heart failure progressing to now cardiogenic shock.  Point of care echo showed a markedly depressed EF (closer to 15-20%); formal TTE just 2 months ago showed a normal EF.  Troponin was markedly elevated at greater than 14,000. Unclear etiology of patient's precipitous decline over the past 5 days.  Differential for this is quite broad.  Given his history of  CAD, ischemia is high on the differential.  However, I feel that this does not quite fit patient's acute presentation, as patient denies any chest pain whatsoever.  My personal opinion is that patient's presentation is more fitting of a myocarditis picture.  Again, unclear etiology and differential is wide.  Work-up as below.   Cardiovascular # Acute Onset Systolic Heart Failure # Cardiogenic Shock #  Troponinemia # Known LBBB - Formal TTE in AM to assess function and any regional wall motion abnormalities - Investigate potential etiologies of acute shock including thyroid abnormalities, drugs/alcohol, infection - While I don't think that this is ischemic, will anticoagulate as there is no clear way to be sure (patient already getting heparin gtt for AF) - Dobutamine 2.5 for inotropic support. Will need to think about alternative agent, if dobutamine exacerbates patient's arrhythmias  # Atrial Fibrillation with RVR - No rate control for now given patient in shock - If incessantly tachycardic, will discuss switching inotropes   Respiratory # Pleural Effusion # Tachypnea :: Patient not hypoxic. Feel that patient's tachypnea is more metabolically driven.  - Continue to monitor closely. Encourage incentive spirometry use when patient more alert.  - Check ABG   Hematologic # Thrombocytopenia  # Macrocytic anemia - Send DIC panel - Evaluate B12, thyroid, potential alcoholism (less likely given ethanol level < 10)   Gastrointestinal # Elevated Transaminases - AST disproportionally elevated relative to ALT. Could be related to alcohol intake, but most likely 2/2 to huge troponin leak. When patient more awake or able to get in touch with wife, will inquire more about potential alcohol use.  - NPO for now given shock - Stress ulcer prophylaxis ordered - Order for bowel regimen   Renal/Genitourinary # ? AKI :: Unclear baseline, but assuming his sCr was closer to normal (as it was two years ago), this decline in renal function could be a result of malperfusion, obstruction, or some other acute insult - Check UA and UCx - Place foley catheter - Monitor strict I&Os  - Will plan to diurese after electrolytes are somewhat repleted   # Lactic Acidemia - Will continue to trend as a surrogate marker for a resuscitative efforts (currently 2.4 from 2.1)  # Hypokalemia - Plan to replete,  but will have to be conscientious about associated volume load, as I do not trust patient to be able to take PO K with his current mental state   Infectious - Check blood cultures x 2 - Check respiratory viral panel for evaluation of possible causes of acute onset myocarditis - Check UA and urine culture - HIV screen - Empiric antibiotics if patient were to become febrile or further decompenate  Endocrine - A1c normal; will encourage PO intake when patient more awake. For now will monitor blood sugars - Hypoglycemia protocol - Assess for Thyroid derangements (understand that screening during critical illness may yield less reliable results)   Neurologic - Continue to monitor - Delirium precautions      Signed: Clois Dupes 05/17/2019, 3:08 AM

## 2019-05-17 NOTE — CV Procedure (Signed)
Radial arterial line placement.    Emergent consent assumed. The left wrist was prepped and draped in the routine sterile fashion a single lumen radial arterial catheter was placed in the left radial artery using a modified Seldinger technique. Good blood flow and wave forms. A dressing was placed.     Glori Bickers, MD  2:16 PM

## 2019-05-17 NOTE — Procedures (Signed)
Heart rate is too high for accurate echo at this time.  Patient has aortic valve replacement and workup would not be accurate at this rate. Please call operator to page echo tech during weekend hours if we should proceed with echo regardless of heart rate.

## 2019-05-17 NOTE — Consult Note (Signed)
NAME:  Justin Valencia, MRN:  [id number], DOB:  07/30/1941, LOS: 0 ADMISSION DATE:  05/08/2019, CONSULTATION DATE:  05/10/2019 REFERRING MD:  Marlou Porch - [facility], CHIEF COMPLAINT:  Hypotension   HPI/course in hospital  78 year old man with several days of progressive weakness and a fall at home. He is somnolent and cannot provide any clear history of concurrent symptomatology. There is no history of chest pain. No abdominal pain or dyspnea.  He was found to be dyspneic and hypotensive. New evidence of decreased EF with elevated troponin, but warm extremities and thrombocytopenia not consistent with pure cardiogenic shock.  Past Medical History   Past Medical History:  Diagnosis Date  . Aortic stenosis    Aortic valve replacement 2006  . ASD secundum    Closure, October, 2006  . Atrial fibrillation (HCC)    Postoperative, previously treated with amiodarone and Coumadin, then stop  . Bilateral carotid artery disease ([place])   . CAD, multiple vessel    Stress echo, January , 2012, no ischemia  . Carotid artery disease (HCC)    Bilateral, followed closely by Dr. Bridgett Larsson ([facility]), February, 2012  . Cerebrovascular disease 12/04/2014  . Chest wall pain    October, 2012  . Chronic back pain    And sternal  . Diabetes mellitus type II   . Ejection fraction    EF, . 60-65%,echo, January, 2012  . 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 Hx of CABG    2006, aortic valve replacement, ASD closure  . Hyperlipidemia    Abnormal LFTs in the past, Lipitor discontinued, followed by Dr. Nadara Mustard he  . Hypertension   . Mitral regurgitation    Echo, January, 2012  . Nephrolithiasis   . Numbness and tingling in hands    October, 2012  . S/P aortic valve replacement    2006, bovine pericardial tissue valve /   , Normal function, echo, January, 2012     Past Surgical History:  Procedure Laterality Date  . AORTIC VALVE REPLACEMENT  06/2005   Bovine pericardial tissue/ Secundum ASD closure  . CHOLECYSTECTOMY    .  CORONARY ARTERY BYPASS GRAFT  06/2005   LIMA to LAD, SVG to first diagonal, SVG to first obtuse marginal, SVG to PDA  . INGUINAL HERNIA REPAIR  2002   Bilateral  . NOSE SURGERY    . VASECTOMY       Review of Systems:   Review of Systems  Unable to perform ROS: Mental acuity    Social History   reports that he quit smoking about 14 years ago. His smoking use included cigarettes. He has a 12.00 pack-year smoking history. He has never used smokeless tobacco. He reports that he does not drink alcohol or use drugs.   Family History   His family history includes COPD in his father; Healthy in his brother, brother, sister, sister, and sister; Heart attack in his mother; Heart failure in his father.   Allergies Allergies  Allergen Reactions  . Sulfamethoxazole Rash    Reported by [facility] in 2016     Home Medications  Prior to Admission medications   Medication Sig Start Date End Date Taking? Authorizing Provider  acetaminophen (TYLENOL) 500 MG tablet Take 1,000 mg by mouth every 6 (six) hours as needed for headache (pain).   Yes [provider]  amLODipine (NORVASC) 5 MG tablet Take 5 mg by mouth daily.   Yes [provider]  aspirin EC 81 MG tablet  Take 81 mg by mouth at bedtime.   Yes [provider]  atorvastatin (LIPITOR) 10 MG tablet Take 10 mg by mouth at bedtime.    Yes [provider]  gemfibrozil (LOPID) 600 MG tablet Take 600 mg by mouth 2 (two) times daily.     Yes [provider]  glipiZIDE (GLUCOTROL XL) 10 MG 24 hr tablet Take 10 mg by mouth daily with breakfast.   Yes [provider]  Magnesium 250 MG TABS Take 250 mg by mouth at bedtime.    Yes [provider]  Multiple Vitamin (MULTIVITAMIN WITH MINERALS) TABS tablet Take 1 tablet by mouth daily.   Yes [provider]  OVER THE COUNTER MEDICATION Take 1 tablet by mouth at bedtime. Zinc 30 mg/ copper 2 mg   Yes [provider]      Interim history/subjective:  He denies pain at this time.  Objective   Blood pressure (!) 107/52, pulse (!) 46, temperature 98 F (36.7 C), temperature source Oral, resp. rate (!) 32, height _0  (1.803 m), weight 92.9 kg, SpO2 95 %.        Intake/Output Summary (Last 24 hours) at 05/17/2019 1025 Last data filed at 05/17/2019 1000 Gross per 24 hour  Intake 1024.41 ml  Output 1025 ml  Net -0.59 ml   Filed Weights   05/17/2019 2004 05/17/19 0145  Weight: 91.6 kg 92.9 kg    Examination: Physical Exam Constitutional:      General: He is in acute distress.     Appearance: He is toxic-appearing.     Comments: Flushed complexion.  HENT:     Head: Normocephalic and atraumatic.  Eyes:     Conjunctiva/sclera: Conjunctivae normal.     Comments: Right gaze preference.  Neck:     Musculoskeletal: Neck supple.  Cardiovascular:     Rate and Rhythm: Tachycardia present. Rhythm irregular.     Chest Wall: PMI is not displaced.     Pulses: Normal pulses.     Heart sounds: S1 normal and S2 normal. Heart sounds are distant. No murmur. No gallop.      Comments: With venous stasis changes Pulmonary:     Breath sounds: Normal breath sounds. No wheezing or rales.     Comments: Moderate dyspnea Musculoskeletal:     Right lower leg: 1+ Pitting Edema present.     Left lower leg: 1+ Pitting Edema present.  Skin:    General: Skin is warm and dry.     Capillary Refill: Capillary refill takes 2 to 3 seconds.  Neurological:     Mental Status: He is disoriented.     Comments: Mild left sided weakness.     I performed a limited echocardiogram  Patient was in atrial fibrillation. LVH, severe LV dysfunction with EF 18%, septal dyskinesis from prior CABG. Moderate dilatation. Severe MAC with restricted MV movement. Mild MR, no vegetations. Unable to assess diastolic function.  Prosthetic valve with mild AI. RV normal size, Mild dysfunction with hypokinesis of the mid free wall. Mild TR with RVSP  estimated 27. IVC normal caliber with 22% respiratory variation, suggesting normal intravascular volume.  Ancillary tests (personally reviewed)  CBC: Recent Labs  Lab 05/17/2019 2018 05/10/2019 2039 05/17/19 0109 05/17/19 0400 05/17/19 0802  WBC 7.7  --   --   --  5.7  NEUTROABS 6.3  --   --   --   --   HGB 11.7* 12.6* 11.6*  --  12.0*  HCT 34.8*  37.0* 34.0*  --  34.4*  MCV 103.0*  --   --   --  99.7  PLT 67*  --   --  65* 62*    Basic Metabolic Panel: Recent Labs  Lab 05/11/2019 2018 05/07/2019 2039 05/17/19 0109 05/17/19 0802  NA 132* 135 135 136  K 3.1* 3.0* 3.0* 3.3*  CL 102 103  --  106  CO2 16*  --   --  15*  GLUCOSE 145* 143*  --  104*  BUN 58* 52*  --  63*  CREATININE 2.14* 2.00*  --  1.90*  CALCIUM 9.0  --   --  8.8*  MG 2.4  --   --  2.3   GFR: Estimated Creatinine Clearance: 39.1 mL/min (A) (by C-G formula based on SCr of 1.9 mg/dL (H)). Recent Labs  Lab 05/15/2019 2018 05/17/2019 2310 05/17/19 0244 05/17/19 0536 05/17/19 0802  WBC 7.7  --   --   --  5.7  LATICACIDVEN  --  2.1* 2.4* 2.3* 1.7    Liver Function Tests: Recent Labs  Lab 05/09/2019 2018 05/17/19 0802  AST 155* 156*  ALT 56* 65*  ALKPHOS 58 56  BILITOT 1.4* 1.6*  PROT 6.6 6.4*  ALBUMIN 3.1* 2.9*   No results for input(s): LIPASE, AMYLASE in the last 168 hours. No results for input(s): AMMONIA in the last 168 hours.  ABG    Component Value Date/Time   PHART 7.397 05/17/2019 0109   PCO2ART 25.0 (L) 05/17/2019 0109   PO2ART 72.0 (L) 05/17/2019 0109   HCO3 15.5 (L) 05/17/2019 0109   TCO2 16 (L) 05/17/2019 0109   ACIDBASEDEF 8.0 (H) 05/17/2019 0109   O2SAT 95.0 05/17/2019 0109     Coagulation Profile: Recent Labs  Lab 05/13/2019 2018 05/17/19 0400  INR 1.6* 1.5*    Cardiac Enzymes: No results for input(s): CKTOTAL, CKMB, CKMBINDEX, TROPONINI in the last 168 hours.  HbA1C: Hgb A1c MFr Bld  Date/Time Value Ref Range Status  05/17/2019 02:44 AM 5.9 (H) 4.8 - 5.6 % Final     Comment:    (NOTE) Pre diabetes:          5.7%-6.4% Diabetes:              >6.4% Glycemic control for   <7.0% adults with diabetes     CBG: No results for input(s): GLUCAP in the last 168 hours.  MICRO Respiratory pathogens panel is negative.  CXR Poor inspiration, compatible with CHF   Assessment & Plan:   Critically ill due inotrope dependent shock requiring titration of dobutamine to maintain MAP>65.  While he certainly has a cardiogenic component given his reduced LVSF, his clinical profile doesn't fully fit with pure cardiogenic shock. Clinically he appears dry and his echo parameters do not suggest massive overlo Echo shows no evidence of endocarditis and he doesn't have peripheral stigmata. Pulmonary artery catheterization might be useful in directing hemodynamic management. Examination doesn't suggest a source and CXR RVP do suggest infection. Blood cultures are pending. No evidence of tamponade, or significant RV dysfunction to suggest PE. Thrombocytopenia and mildly elevated LFT's might suggest liver dysfunction.   Recommendations. Follow current cultures Abdominal U/S looking for liver texture, splenomegaly, pyelonephritis or acalculous cholecystitis. Follow LFT's. Urinalysis with reflex to culture Consider placing PAC.  Follow serial lactate Consider fluid challenge if Creatinine continues to rise or Lactate starts to climb.   Kipp Brood, MD [facility] ICU Physician [place]  Pager: [phone] Mobile: [phone] After  hours: [phone].  05/17/2019, 10:25 AM

## 2019-05-17 NOTE — Progress Notes (Signed)
   RHC numbers  RA 16 PA 48/32 (40) PCWP 30 Thermo 3.5/1.6 SVR 1218 Co-ox 44% CPO = 0.53  He has profound cardiogenic shock with volume overload despite dobutamine. Will add NE. Titrate as needed.   D/w Dr. Abigail Butts and patient's wife.   Additional CCT 35 mins.   Glori Bickers, MD  3:16 PM

## 2019-05-17 NOTE — Progress Notes (Signed)
   Spoke to wife Tim Lair On Monday or Tuesday, this illness started with shakes and chills she states. No cough.  Tried to get him to go to ER Thursday, he did not comply.  Friday to weak to stand.   Spoke with CCM, will empirically start ceftriaxone 2g IV Q24 to cover potential UTI. Urine culture pending.   Candee Furbish, MD

## 2019-05-17 NOTE — Progress Notes (Addendum)
Progress Note  Patient Name: Justin Valencia Date of Encounter: 05/17/2019  Primary Cardiologist: No primary care provider on file.  [facility]  Subjective   Looks at me, opens his eyes, moans in a high pitch.  Unable to vocalize complete words when asked if anything hurts.  Does not wince when pushing on abdomen.  Hands and feet are warm.  Inpatient Medications    Scheduled Meds: . Chlorhexidine Gluconate Cloth  6 each Topical Daily  . dextrose  12.5 g Intravenous STAT  . insulin aspart  0-9 Units Subcutaneous Q6H  . mouth rinse  15 mL Mouth Rinse BID  . pantoprazole (PROTONIX) IV  40 mg Intravenous Daily   Continuous Infusions: . sodium chloride Stopped (05/17/19 0601)  . DOBUTamine 2.5 mcg/kg/min (05/17/19 0601)  . heparin 1,400 Units/hr (05/17/19 0601)  . potassium chloride 10 mEq (05/17/19 0747)   PRN Meds: acetaminophen, sennosides   Vital Signs    Vitals:   05/17/19 0615 05/17/19 0630 05/17/19 0700 05/17/19 0819  BP: 118/82 102/65 114/67   Pulse:      Resp: (!) 34 (!) 37 (!) 36   Temp:    98 F (36.7 C)  TempSrc:    Oral  SpO2: 96% 95% 95%   Weight:      Height:        Intake/Output Summary (Last 24 hours) at 05/17/2019 0842 Last data filed at 05/17/2019 0601 Gross per 24 hour  Intake 685.49 ml  Output 800 ml  Net -114.51 ml   Last 3 Weights 05/17/2019 05/13/2019 12/04/2014  Weight (lbs) 204 lb 12.9 oz 202 lb 214 lb  Weight (kg) 92.9 kg 91.627 kg 97.07 kg      Telemetry    Interventricular conduction delay with atrial fibrillation heart rate currently in the 120s- Personally Reviewed  ECG    Wide left bundle branch block atrial fibrillation- Personally Reviewed  Physical Exam   GEN:  Fairly obtunded but opens eyes moans and high pitch ill.   Neck: Mid neck JVD Cardiac:  Irregularly irregular, soft systolic murmur, rubs, or gallops.  Respiratory: Clear to auscultation bilaterally with increased respiratory rate. GI: Soft, nontender,  non-distended  MS: No edema; No deformity.  Warm extremities hands and feet Neuro:  Nonfocal, somewhat somnolent Psych: Unable to fully obtain  Labs    High Sensitivity Troponin:   Recent Labs  Lab 05/12/2019 2018 05/08/2019 2310  TROPONINIHS 14,474* 13,617*      Chemistry Recent Labs  Lab 06/03/2019 2018 06/04/2019 2039 05/17/19 0109  NA 132* 135 135  K 3.1* 3.0* 3.0*  CL 102 103  --   CO2 16*  --   --   GLUCOSE 145* 143*  --   BUN 58* 52*  --   CREATININE 2.14* 2.00*  --   CALCIUM 9.0  --   --   PROT 6.6  --   --   ALBUMIN 3.1*  --   --   AST 155*  --   --   ALT 56*  --   --   ALKPHOS 58  --   --   BILITOT 1.4*  --   --   GFRNONAA 29*  --   --   GFRAA 34*  --   --   ANIONGAP 14  --   --      Hematology Recent Labs  Lab 05/12/2019 2018 05/06/2019 2039 05/17/19 0109 05/17/19 0400  WBC 7.7  --   --   --  RBC 3.38*  --   --   --   HGB 11.7* 12.6* 11.6*  --   HCT 34.8* 37.0* 34.0*  --   MCV 103.0*  --   --   --   MCH 34.6*  --   --   --   MCHC 33.6  --   --   --   RDW 14.5  --   --   --   PLT 67*  --   --  65*    BNPNo results for input(s): BNP, PROBNP in the last 168 hours.   DDimer  Recent Labs  Lab 05/17/19 0400  DDIMER 11.08*     Radiology    Dg Chest Portable 1 View  Result Date: 05/14/2019 CLINICAL DATA:  Weakness and shortness of breath. EXAM: PORTABLE CHEST 1 VIEW COMPARISON:  T5 14 FINDINGS: Post median sternotomy with aortic valve replacement. Cardiomegaly is similar. Unchanged mediastinal contours with aortic tortuosity. Interstitial coarsening with Kerley B-lines consistent with pulmonary edema. Possible small pleural effusions. No confluent airspace disease. No pneumothorax. Bones are under mineralized. IMPRESSION: Cardiomegaly with mild pulmonary edema. Suspect small pleural effusions. Findings consistent with CHF. Electronically Signed   By: Narda Rutherford M.D.   On: 05/27/2019 20:24    Cardiac Studies   Point-of-care echo EF 15 to 20%   Patient Profile     78 y.o. male with apparent cardiogenic shock, high-sensitivity troponin of 14,000 trending down, known CAD post CABG, TAVR, with markedly reduced ejection fraction compared to normal several months ago, obtunded, warm hands and feet  Assessment & Plan    Cardiogenic shock - Certainly could be a mixed picture as well, his ejection fraction is markedly reduced on point-of-care echo.  Low-dose dobutamine 2.5 is being utilized.  Atrial fibrillation is noted with heart rates currently 112 bpm.  Left bundle branch block, wider QRS than previous noted.  Could this be myocarditis?  Also, hands and feet appear warm possibly indicative of lower SVR than expected in the setting of cardiogenic shock, could there be a mixed picture of vasodilatation as well.  No leukocytosis, white count 7.7.  Central access would be helpful to obtain a co-ox.  I discussed with critical care team, Dr. Denese Killings will see in consultation.  Thrombocytopenia - Could be secondary to acute illness.  Would not necessarily expect this in pure cardiogenic shock.  D-dimer markedly elevated, sed rate elevated as well 5.  Fibrinogen also elevated however there were no schistocytes on smear.  TSH normal.  Altered mental status/encephalopathy - Secondary to underlying illness, poor perfusion etc.  Try to obtain wife unable.  TAVR - No indication of significant abnormality at this point.  Try to obtain a transthoracic echocardiogram however atrial fibrillation rate too fast at this time.  Acute kidney injury -Likely hypoperfusion.  Creatinine slightly better from 2.1-2.0.  Low-dose dobutamine being utilized.  CAD post CABG several years ago  Non-ST elevation myocardial infarction - Markedly elevated troponin 14,000 trending downward.  Could this be a thrombotic event or myocarditis type event.  For now, we do have him on IV heparin.  Close monitoring of platelets.  No evidence of bleeding.  Metabolic acidosis with  respiratory compensation -PCO2 25 with pH of 7.39.  Lactic acid elevation 2.1-2.4 in the setting of decreased perfusion.  Hypokalemia -Replating.  We will discuss as well with advanced heart failure team.  CRITICAL CARE Performed by: Donato Schultz   Total critical care time: 40 minutes  Critical care time  was exclusive of separately billable procedures and treating other patients.  Critical care was necessary to treat or prevent imminent or life-threatening deterioration.  Critical care was time spent personally by me on the following activities: development of treatment plan with patient and/or surrogate as well as nursing, discussions with consultants, evaluation of patient's response to treatment, examination of patient, obtaining history from patient or surrogate, ordering and performing treatments and interventions, ordering and review of laboratory studies, ordering and review of radiographic studies, pulse oximetry and re-evaluation of patient's condition.   For questions or updates, please contact [place] Please consult [email or web address] for contact info under        Signed, Candee Furbish, MD  05/17/2019, 8:42 AM

## 2019-05-17 NOTE — Progress Notes (Signed)
Pharmacy Antibiotic Note  Justin Valencia is a 78 y.o. male admitted on 06-06-2019 with Afib and cardiogenic shock.  BCx, Ucx drawn, viral panel neg Tm 101.8, wbc wnl, Cr 2 crcl 2ml/min.  Will start broad spectrum ABX for fever and re-evaluate with Cx data Pharmacy has been consulted for Vancomycin and cefepime  dosing.  Plan: Vancomycin 1500mg  IV x1 then 1250mg  q24h Cefepime 2gm IV q8h  Height: 5\' 11"  (180.3 cm) Weight: 204 lb 12.9 oz (92.9 kg) IBW/kg (Calculated) : 75.3  Temp (24hrs), Avg:99.7 F (37.6 C), Min:97.4 F (36.3 C), Max:101.8 F (38.8 C)  Recent Labs  Lab 06-06-19 2018 Jun 06, 2019 2039 06/06/2019 2310 05/17/19 0244 05/17/19 0536 05/17/19 0802 05/17/19 1152  WBC 7.7  --   --   --   --  5.7  --   CREATININE 2.14* 2.00*  --   --   --  1.90*  --   LATICACIDVEN  --   --  2.1* 2.4* 2.3* 1.7 1.5    Estimated Creatinine Clearance: 39.1 mL/min (A) (by C-G formula based on SCr of 1.9 mg/dL (H)).    Allergies  Allergen Reactions  . Sulfamethoxazole Rash    Reported by [facility] in 2016    Antimicrobials this admission:   Dose adjustments this admission:   Microbiology results:   Bonnita Nasuti Pharm.D. CPP, BCPS Clinical Pharmacist [phone] 05/17/2019 3:54 PM

## 2019-05-17 NOTE — Progress Notes (Signed)
ANTICOAGULATION CONSULT NOTE - Initial Consult  Pharmacy Consult for Heparin Indication: atrial fibrillation  Allergies  Allergen Reactions  . Sulfamethoxazole Rash    [facility]    Patient Measurements: Height: 5\' 11"  (180.3 cm) Weight: 204 lb 12.9 oz (92.9 kg) IBW/kg (Calculated) : 75.3  Vital Signs: Temp: 97.6 F (36.4 C) (09/11 2002) Temp Source: Oral (09/11 2002) BP: 103/70 (09/12 0200) Pulse Rate: 46 (09/12 0100)  Labs: Recent Labs    Jul 05, 2019 2018 Jul 05, 2019 2039 Jul 05, 2019 2310 05/17/19 0109  HGB 11.7* 12.6*  --  11.6*  HCT 34.8* 37.0*  --  34.0*  PLT 67*  --   --   --   LABPROT 19.2*  --   --   --   INR 1.6*  --   --   --   CREATININE 2.14* 2.00*  --   --   TROPONINIHS 14,474*  --  16,10913,617*  --     Estimated Creatinine Clearance: 37.1 mL/min (A) (by C-G formula based on SCr of 2 mg/dL (H)).   Medical History: Past Medical History:  Diagnosis Date  . Aortic stenosis    Aortic valve replacement 2006  . ASD secundum    Closure, October, 2006  . Atrial fibrillation (HCC)    Postoperative, previously treated with amiodarone and Coumadin, then stop  . Bilateral carotid artery disease (HCC)   . CAD, multiple vessel    Stress echo, January , 2012, no ischemia  . Carotid artery disease (HCC)    Bilateral, followed closely by Dr. Imogene Burnhen ([facility]), February, 2012  . Cerebrovascular disease 12/04/2014  . Chest wall pain    October, 2012  . Chronic back pain    And sternal  . Diabetes mellitus type II   . Ejection fraction    EF, . 60-65%,echo, January, 2012  . 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 Hx of CABG    2006, aortic valve replacement, ASD closure  . Hyperlipidemia    Abnormal LFTs in the past, Lipitor discontinued, followed by Dr. Dimas AguasHoward he  . Hypertension   . Mitral regurgitation    Echo, January, 2012  . Nephrolithiasis   . Numbness and tingling in hands    October, 2012  . S/P aortic valve replacement    2006, bovine pericardial  tissue valve /   , Normal function, echo, January, 2012    Medications:  Medications Prior to Admission  Medication Sig Dispense Refill Last Dose  . acetaminophen (TYLENOL) 500 MG tablet Take 1,000 mg by mouth every 6 (six) hours as needed for headache (pain).   16-Mar-2019 at am  . amLODipine (NORVASC) 5 MG tablet Take 5 mg by mouth daily.   16-Mar-2019 at am  . aspirin EC 81 MG tablet Take 81 mg by mouth at bedtime.   unknown  . atorvastatin (LIPITOR) 10 MG tablet Take 10 mg by mouth at bedtime.    unknown  . gemfibrozil (LOPID) 600 MG tablet Take 600 mg by mouth 2 (two) times daily.     16-Mar-2019 at am  . glipiZIDE (GLUCOTROL XL) 10 MG 24 hr tablet Take 10 mg by mouth daily with breakfast.   16-Mar-2019 at am  . Magnesium 250 MG TABS Take 250 mg by mouth at bedtime.    unknown  . Multiple Vitamin (MULTIVITAMIN WITH MINERALS) TABS tablet Take 1 tablet by mouth daily.   16-Mar-2019 at am  . OVER THE COUNTER MEDICATION Take 1 tablet by mouth at bedtime. Zinc  30 mg/ copper 2 mg   unknown    Assessment: 78 y.o. male with SOB/CHF/Afib for heparin  Goal of Therapy:  Heparin level 0.3-0.7 units/ml Monitor platelets by anticoagulation protocol: Yes   Plan:  Heparin 4000 units IV bolus, then start heparin 1400 units/hr Check heparin level in 8 hours.   Caryl Pina 05/17/2019,3:19 AM

## 2019-05-17 NOTE — Progress Notes (Signed)
ANTICOAGULATION CONSULT NOTE - Follow Up Consult  Pharmacy Consult for Heparin Indication: atrial fibrillation  Allergies  Allergen Reactions  . Sulfamethoxazole Rash    [facility] by [facility]    Patient Measurements: Height: 5\' 11"  (180.3 cm) Weight: 204 lb 12.9 oz (92.9 kg) IBW/kg (Calculated) : 75.3  Vital Signs: Temp: 101.3 F (38.5 C) (09/12 1456) Temp Source: Oral (09/12 1155) BP: 102/58 (09/12 1435)  Labs: Recent Labs    05/15/2019 2018 05/09/2019 2039 05/10/2019 2310 05/17/19 0109 05/17/19 0400 05/17/19 0802 05/17/19 1152  HGB 11.7* 12.6*  --  11.6*  --  12.0*  --   HCT 34.8* 37.0*  --  34.0*  --  34.4*  --   PLT 67*  --   --   --  65* 62*  --   APTT  --   --   --   --  32  --   --   LABPROT 19.2*  --   --   --  17.8*  --   --   INR 1.6*  --   --   --  1.5*  --   --   HEPARINUNFRC  --   --   --   --   --   --  <0.10*  CREATININE 2.14* 2.00*  --   --   --  1.90*  --   TROPONINIHS 14,474*  --  40,10213,617*  --   --   --   --     Estimated Creatinine Clearance: 39.1 mL/min (A) (by C-G formula based on SCr of 1.9 mg/dL (H)).   Medical History: Past Medical History:  Diagnosis Date  . Aortic stenosis    Aortic valve replacement 2006  . ASD secundum    Closure, October, 2006  . Atrial fibrillation (HCC)    Postoperative, previously treated with amiodarone and Coumadin, then stop  . Bilateral carotid artery disease (HCC)   . CAD, multiple vessel    Stress echo, January , 2012, no ischemia  . Carotid artery disease (HCC)    Bilateral, followed closely by Dr. Imogene Burnhen ([facility]), February, 2012  . Cerebrovascular disease 12/04/2014  . Chest wall pain    October, 2012  . Chronic back pain    And sternal  . Diabetes mellitus type II   . Ejection fraction    EF, . 60-65%,echo, January, 2012  . 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 Hx of CABG    2006, aortic valve replacement, ASD closure  . Hyperlipidemia    Abnormal LFTs in the past, Lipitor discontinued, followed  by Dr. Dimas AguasHoward he  . Hypertension   . Mitral regurgitation    Echo, January, 2012  . Nephrolithiasis   . Numbness and tingling in hands    October, 2012  . S/P aortic valve replacement    2006, bovine pericardial tissue valve /   , Normal function, echo, January, 2012    Medications:  Medications Prior to Admission  Medication Sig Dispense Refill Last Dose  . acetaminophen (TYLENOL) 500 MG tablet Take 1,000 mg by mouth every 6 (six) hours as needed for headache (pain).   05/30/2019 at am  . amLODipine (NORVASC) 5 MG tablet Take 5 mg by mouth daily.   05/07/2019 at am  . aspirin EC 81 MG tablet Take 81 mg by mouth at bedtime.   unknown  . atorvastatin (LIPITOR) 10 MG tablet Take 10 mg by mouth at bedtime.    unknown  . gemfibrozil (LOPID)  600 MG tablet Take 600 mg by mouth 2 (two) times daily.     June 09, 2019 at am  . glipiZIDE (GLUCOTROL XL) 10 MG 24 hr tablet Take 10 mg by mouth daily with breakfast.   09-Jun-2019 at am  . Magnesium 250 MG TABS Take 250 mg by mouth at bedtime.    unknown  . Multiple Vitamin (MULTIVITAMIN WITH MINERALS) TABS tablet Take 1 tablet by mouth daily.   06/09/2019 at am  . OVER THE COUNTER MEDICATION Take 1 tablet by mouth at bedtime. Zinc 30 mg/ copper 2 mg   unknown    Assessment: 78 y.o. male with SOB/CHF/Afib for heparin  Heparin drip 1400 uts/hr HL < 0.1 less than goal - then heparin held for CL placement.   Restart heparin 4hr after CL placed at increased rate - no bolus  H/h stable  - pltc 60s  - discussed with MD - continue heparin  Goal of Therapy:  Heparin level 0.3-0.7 units/ml Monitor platelets by anticoagulation protocol: Yes   Plan:  Restart heparin drip at 6pm at drip rate 1600 units/hr Check heparin level in 6hr from restart Daily HL, CBC  Bonnita Nasuti Pharm.D. CPP, BCPS Clinical Pharmacist [phone] 05/17/2019 3:50 PM

## 2019-05-17 NOTE — CV Procedure (Addendum)
Swan-Ganz catheter insertion  Indication: Cardiogenic shock  The risks and indication of the procedure were explained. Consent was signed and placed on the chart. An appropriate timeout was taken prior to the procedure.   The right neck was prepped and draped in the routine sterile fashion and anesthetized with 1% local lidocaine. A 7 FR venous sheath was placed in the right internal jugular vein using a modified Seldinger technique. A standard Swan-Ganz catheter was used for the procedure. The distal tip of the PA cath was maneuvered into the right pulmonary artery under fluoroscopic guidance and the sheath was sutured in place.   CXR: No PTX. Adequate placement.   Glori Bickers, MD  2:16 PM

## 2019-05-17 NOTE — Progress Notes (Signed)
  Echocardiogram 2D Echocardiogram has been performed.  Justin Valencia 05/17/2019, 5:13 PM

## 2019-05-17 NOTE — Progress Notes (Addendum)
  Echo images reviewed with Dr. Audie Box. Concern for increasing gradients over both MV and VIV TAVR. He has raised spectre of recurrent endocarditis.   Bcx now 2/2 + for enterococcus.   After discussion with PharmD, abx expanded earlier  today to Vanc and cefipime. Will need ID input to discuss narrowing abx and long-term abx regimen.  Now on dobutamine and NE for hemodynamic support. Will repeat ABG and co-ox.   Will need TEE when respiratory status improves or after patient is intubated if that occurs.   Etiology of LV dysfunction and elevated troponin remains unclear but may be related to septic cardiomyopathy or valvular dysfunction.   If mental status not improving in am will need head CT to look for embolic event.   Additional  CCT time. 40 minutes.   Glori Bickers, MD  9:33 PM

## 2019-05-17 NOTE — Consult Note (Addendum)
Advanced Heart Failure Team Consult Note   Primary Physician: Primary Cardiologist:  Genene Churn [facility] [place])  Consulting MD: Marlou Porch  Reason for Consult:    HPI:     Justin Valencia is a 78 year old gentleman with CAD s/p CABG (2006; LIMA to LAD, SVG to D1, SVG to OM1, SVG to PDA); h/o AS s/p SAVR (2006) and TAVR (2016), h/o ASD s/p patch closure (2006), HTN, dyslipidemia, prior CVA, h/o infective endocarditis of the mitral valve (2013), and DMII who is followed by Dr. Daiva Huge at [facility]. The AHF team is asked to see in consult for further evaluation of shock.   He presented to the ED on 9/11. Wife said it started on Monday with an episode of chills but no fevers. Then developed several days of AMS, progressive weakness and SOB.This culminated to the point that he fell off of the toilet and was unable to lift himself up off the floor even with the assistance of his family.  EMS was called and patient was brought to [facility] ED.  In the ED, showed vital signs showed that patient was afebrile, with BP 83/53, heart rate in the 110-130s.  EKG showed atrial fibrillation with aberrancy in a left bundle branch block pattern (LBBB was seen on ECG back in 2017, so this was not new).  Labs were notable for a sodium of 132, potassium 3.1, bicarb 16, serum creatinine 2.14 (unclear recent baseline, last available creatinine in our records was normal several years ago), AST/ALT were elevated (155/56) with a mildly elevated T bili and normal alk phos.  INR was 1.6.  CBC revealed a normal white count, hemoglobin 11.7, and platelets 67 (normal several years ago). Initial troponin was 14,474, which down trended to 13, 617 approximately 3 hours later.  Initial lactate was 2.1.  COVID PCR was negative.   Echo done at bedside shows EF 25% with severe dyssynchrony due to LBBB. RV moderately down.In care everywhere patient had a TTE performed at [facility] in 03/2019 and 7/17 that showed a normal EF  He was started  on dobutamine overnight. Bicarb 16-> 15. However lactate 2.3-> 1.7. Creatinine 2.0 -> 1.9  hsTROP 14.4K -> 13.6K   Review of Systems:   General: Weight gain '[ ]' ; Weight loss '[ ]' ; Anorexia '[ ]' ; Fatigue Blue.Reese ]; Fever '[ ]' ; Chills '[ ]' ; Weakness Blue.Reese ]  Cardiac: Chest pain/pressure '[ ]' ; Resting SOB '[ ]' ; Exertional SOB Blue.Reese ]; Orthopnea '[ ]' ; Pedal Edema '[ ]' ; Palpitations '[ ]' ; Syncope '[ ]' ; Presyncope '[ ]' ; Paroxysmal nocturnal dyspnea'[ ]'   Pulmonary: Cough '[ ]' ; Wheezing'[ ]' ; Hemoptysis'[ ]' ; Sputum '[ ]' ; Snoring '[ ]'   GI: Vomiting'[ ]' ; Dysphagia'[ ]' ; Melena'[ ]' ; Hematochezia '[ ]' ; Heartburn'[ ]' ; Abdominal pain '[ ]' ; Constipation '[ ]' ; Diarrhea '[ ]' ; BRBPR '[ ]'   GU: Hematuria'[ ]' ; Dysuria '[ ]' ; Nocturia'[ ]'   Vascular: Pain in legs with walking '[ ]' ; Pain in feet with lying flat '[ ]' ; Non-healing sores '[ ]' ; Stroke Blue.Reese ]; TIA '[ ]' ; Slurred speech '[ ]' ;  Neuro: Headaches'[ ]' ; Vertigo'[ ]' ; Seizures'[ ]' ; Paresthesias'[ ]' ;Blurred vision '[ ]' ; Diplopia '[ ]' ; Vision changes '[ ]'   Ortho/Skin: Arthritis Blue.Reese ]; Joint pain [ y]; Muscle pain '[ ]' ; Joint swelling '[ ]' ; Back Pain '[ ]' ; Rash '[ ]'   Psych: Depression'[ ]' ; Anxiety'[ ]'   Heme: Bleeding problems '[ ]' ; Clotting disorders '[ ]' ; Anemia [ y]  Endocrine: Diabetes Blue.Reese ]; Thyroid dysfunction'[ ]'   Home Medications Prior to  Admission medications   Medication Sig Start Date End Date Taking? Authorizing Provider  acetaminophen (TYLENOL) 500 MG tablet Take 1,000 mg by mouth every 6 (six) hours as needed for headache (pain).   Yes [provider]  amLODipine (NORVASC) 5 MG tablet Take 5 mg by mouth daily.   Yes [provider]  aspirin EC 81 MG tablet Take 81 mg by mouth at bedtime.   Yes [provider]  atorvastatin (LIPITOR) 10 MG tablet Take 10 mg by mouth at bedtime.    Yes [provider]  gemfibrozil (LOPID) 600 MG tablet Take 600 mg by mouth 2 (two) times daily.     Yes [provider]  glipiZIDE (GLUCOTROL XL) 10 MG 24 hr tablet Take 10 mg by mouth daily with  breakfast.   Yes [provider]  Magnesium 250 MG TABS Take 250 mg by mouth at bedtime.    Yes [provider]  Multiple Vitamin (MULTIVITAMIN WITH MINERALS) TABS tablet Take 1 tablet by mouth daily.   Yes [provider]  OVER THE COUNTER MEDICATION Take 1 tablet by mouth at bedtime. Zinc 30 mg/ copper 2 mg   Yes [provider]    Past Medical History: Past Medical History:  Diagnosis Date  . Aortic stenosis    Aortic valve replacement 2006  . ASD secundum    Closure, October, 2006  . Atrial fibrillation (HCC)    Postoperative, previously treated with amiodarone and Coumadin, then stop  . Bilateral carotid artery disease ([place])   . CAD, multiple vessel    Stress echo, January , 2012, no ischemia  . Carotid artery disease (HCC)    Bilateral, followed closely by Dr. Bridgett Larsson ([facility]), February, 2012  . Cerebrovascular disease 12/04/2014  . Chest wall pain    October, 2012  . Chronic back pain    And sternal  . Diabetes mellitus type II   . Ejection fraction    EF, . 60-65%,echo, January, 2012  . 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 Hx of CABG    2006, aortic valve replacement, ASD closure  . Hyperlipidemia    Abnormal LFTs in the past, Lipitor discontinued, followed by Dr. Nadara Mustard he  . Hypertension   . Mitral regurgitation    Echo, January, 2012  . Nephrolithiasis   . Numbness and tingling in hands    October, 2012  . S/P aortic valve replacement    2006, bovine pericardial tissue valve /   , Normal function, echo, January, 2012    Past Surgical History: Past Surgical History:  Procedure Laterality Date  . AORTIC VALVE REPLACEMENT  06/2005   Bovine pericardial tissue/ Secundum ASD closure  . CHOLECYSTECTOMY    . CORONARY ARTERY BYPASS GRAFT  06/2005   LIMA to LAD, SVG to first diagonal, SVG to first obtuse marginal, SVG to PDA  . INGUINAL HERNIA REPAIR  2002   Bilateral  . NOSE SURGERY    . VASECTOMY      Family History: Family History   Problem Relation Age of Onset  . Heart attack Mother   . Heart failure Father   . COPD Father   . Healthy Sister   . Healthy Brother   . Healthy Sister   . Healthy Sister   . Healthy Brother     Social History: Social History   Socioeconomic History  . Marital status: Married    Spouse name: Not on file  . Number of children: 3  . Years of  education: 30  . Highest education level: Not on file  Occupational History  . Occupation: Truck Education administrator: [employer]  . Financial resource strain: Not on file  . Food insecurity    Worry: Not on file    Inability: Not on file  . Transportation needs    Medical: Not on file    Non-medical: Not on file  Tobacco Use  . Smoking status: Former Smoker    Packs/day: 0.30    Years: 40.00    Pack years: 12.00    Types: Cigarettes    Quit date: 09/04/2004    Years since quitting: 14.7  . Smokeless tobacco: Never Used  Substance and Sexual Activity  . Alcohol use: No  . Drug use: No  . Sexual activity: Not on file  Lifestyle  . Physical activity    Days per week: Not on file    Minutes per session: Not on file  . Stress: Not on file  Relationships  . Social Herbalist on phone: Not on file    Gets together: Not on file    Attends religious service: Not on file    Active member of club or organization: Not on file    Attends meetings of clubs or organizations: Not on file    Relationship status: Not on file  Other Topics Concern  . Not on file  Social History Narrative   Married with 3 grown children   Patient is right handed.   Patient drinks 1 cup of coffee daily.    Allergies:  Allergies  Allergen Reactions  . Sulfamethoxazole Rash    Reported by [facility] in 2016    Objective:    Vital Signs:   Temp:  [97.4 F (36.3 C)-99.8 F (37.7 C)] 99.8 F (37.7 C) (09/12 1155) Pulse Rate:  [44-90] 46 (09/12 0100) Resp:  [24-40] 40 (09/12 1100) BP: (80-118)/(52-90)  91/57 (09/12 1100) SpO2:  [91 %-100 %] 92 % (09/12 1100) Weight:  [91.6 kg-92.9 kg] 92.9 kg (09/12 0145) Last BM Date: 05/24/2019 Filed Weights   05/25/2019 2004 05/17/19 0145  Weight: 91.6 kg 92.9 kg    Physical Exam: General:  Ill-appearing. Arousable. But only can tell me his name Mildly tachypneic HEENT: normal MMS dry Neck: supple. JVP 8-9 . Carotids 2+ bilat; no bruits. No lymphadenopathy or thryomegaly appreciated. Cor: PMI nondisplaced. Irregular tachy Lungs: diffuse rhonchi  Abdomen: soft, nontender, nondistended. No hepatosplenomegaly. No bruits or masses. Good bowel sounds. Extremities: warm no cyanosis, clubbing, rash, edema Neuro: Arousable. But only can tell me his name   Telemetry: AF with LBBB 100-110. Personally reviewed    Labs: Basic Metabolic Panel: Recent Labs  Lab 05/14/2019 2018 05/11/2019 2039 05/17/19 0109 05/17/19 0802  NA 132* 135 135 136  K 3.1* 3.0* 3.0* 3.3*  CL 102 103  --  106  CO2 16*  --   --  15*  GLUCOSE 145* 143*  --  104*  BUN 58* 52*  --  63*  CREATININE 2.14* 2.00*  --  1.90*  CALCIUM 9.0  --   --  8.8*  MG 2.4  --   --  2.3    Liver Function Tests: Recent Labs  Lab 05/22/2019 2018 05/17/19 0802  AST 155* 156*  ALT 56* 65*  ALKPHOS 58 56  BILITOT 1.4* 1.6*  PROT 6.6 6.4*  ALBUMIN 3.1* 2.9*   No results for input(s): LIPASE, AMYLASE in  the last 168 hours. No results for input(s): AMMONIA in the last 168 hours.  CBC: Recent Labs  Lab 05/17/2019 Dec 03, 2016 05/28/2019 2037-12-03 05/17/19 0109 05/17/19 0400 05/17/19 0802  WBC 7.7  --   --   --  5.7  NEUTROABS 6.3  --   --   --   --   HGB 11.7* 12.6* 11.6*  --  12.0*  HCT 34.8* 37.0* 34.0*  --  34.4*  MCV 103.0*  --   --   --  99.7  PLT 67*  --   --  65* 62*    Cardiac Enzymes: No results for input(s): CKTOTAL, CKMB, CKMBINDEX, TROPONINI in the last 168 hours.  BNP: BNP (last 3 results) No results for input(s): BNP in the last 8760 hours.  ProBNP (last 3 results) No results for  input(s): PROBNP in the last 8760 hours.   CBG: Recent Labs  Lab 05/17/19 1156  GLUCAP 101*    Coagulation Studies: Recent Labs    05/07/2019 12-03-2016 05/17/19 0400  LABPROT 19.2* 17.8*  INR 1.6* 1.5*    Other results: EKG: AF 116 with LBBB Personally reviewed   Imaging: Dg Chest Portable 1 View  Result Date: 05/13/2019 CLINICAL DATA:  Weakness and shortness of breath. EXAM: PORTABLE CHEST 1 VIEW COMPARISON:  T5 14 FINDINGS: Post median sternotomy with aortic valve replacement. Cardiomegaly is similar. Unchanged mediastinal contours with aortic tortuosity. Interstitial coarsening with Kerley B-lines consistent with pulmonary edema. Possible small pleural effusions. No confluent airspace disease. No pneumothorax. Bones are under mineralized. IMPRESSION: Cardiomegaly with mild pulmonary edema. Suspect small pleural effusions. Findings consistent with CHF. Electronically Signed   By: Keith Rake M.D.   On: 05/27/2019 20:24         Assessment/Plan:   1. Shock - EF newly down 55-60% in 7/20 -> 25% here. Unclear etiology. Trop moderately up but flat. Suspect viral myocarditis with possible multi-system involvement  - Overall suspect cardiogenic but may not explain the whole picture as he is quite encephalopathic despite addition of dobutamine. Volume status doesn't look bad at all and may even be dry  - There is some question for septic picture but I suspect more than likely myocarditis with shock vs tachy-CM due to AF (less likely LBBB). Continue dobutamine and amio. Will place PA cath to further guide - Covid and viral panel negative - lactate down - follow cultures  2. Acute systolic HF - EF 02% see discussion above - Continue inotrope support. Place swan - If recovers consider CRT-D with wide LBBB  3. New onset AF with RVR  - continue amio & heparin  - Personally reviewed  4. CAD s/p CABG - trop up but doubt ACS as it is flat - will need coronary angio if  creatinine permits  5. AKI - unclear baseline - improving with inotrope support - avoid nephrotoxic agents - check renal u/s   6. DM2 - SSI  7. Acute encephalopathy.  - likely metabolic - check ammonia   8 Aortic stenosis - s/p SAVR 12-03-04 - s/p TAVR 2014/12/04  9. Hypokalemia - supp  10. Acute respiratory distress - ABG ok.  - suspect he may tire.  - follow closely - discussed possible intubation with wife   CRITICAL CARE Performed by: Glori Bickers  Total critical care time: 60  minutes  Critical care time was exclusive of separately billable procedures and treating other patients.  Critical care was necessary to treat or prevent imminent or life-threatening deterioration.  Critical care was time spent personally by me (independent of midlevel providers or residents) on the following activities: development of treatment plan with patient and/or surrogate as well as nursing, discussions with consultants, evaluation of patient's response to treatment, examination of patient, obtaining history from patient or surrogate, ordering and performing treatments and interventions, ordering and review of laboratory studies, ordering and review of radiographic studies, pulse oximetry and re-evaluation of patient's condition.    Length of Stay: 0   Glori Bickers MD 05/17/2019, 12:10 PM  Advanced Heart Failure Team Pager [phone] (M-F; 7a - 4p)  Please contact [facility] for night-coverage after hours (4p -7a ) and weekends on [email or web address]

## 2019-05-18 ENCOUNTER — Inpatient Hospital Stay (HOSPITAL_COMMUNITY): Payer: Medicare HMO

## 2019-05-18 DIAGNOSIS — H02402 Unspecified ptosis of left eyelid: Secondary | ICD-10-CM

## 2019-05-18 DIAGNOSIS — Z95828 Presence of other vascular implants and grafts: Secondary | ICD-10-CM

## 2019-05-18 DIAGNOSIS — R29810 Facial weakness: Secondary | ICD-10-CM

## 2019-05-18 DIAGNOSIS — R509 Fever, unspecified: Secondary | ICD-10-CM

## 2019-05-18 DIAGNOSIS — I4891 Unspecified atrial fibrillation: Secondary | ICD-10-CM

## 2019-05-18 DIAGNOSIS — R7881 Bacteremia: Secondary | ICD-10-CM

## 2019-05-18 DIAGNOSIS — Z87891 Personal history of nicotine dependence: Secondary | ICD-10-CM

## 2019-05-18 DIAGNOSIS — R011 Cardiac murmur, unspecified: Secondary | ICD-10-CM

## 2019-05-18 DIAGNOSIS — B952 Enterococcus as the cause of diseases classified elsewhere: Secondary | ICD-10-CM

## 2019-05-18 DIAGNOSIS — Z881 Allergy status to other antibiotic agents status: Secondary | ICD-10-CM

## 2019-05-18 DIAGNOSIS — I350 Nonrheumatic aortic (valve) stenosis: Secondary | ICD-10-CM

## 2019-05-18 DIAGNOSIS — A4181 Sepsis due to Enterococcus: Secondary | ICD-10-CM

## 2019-05-18 DIAGNOSIS — Z8679 Personal history of other diseases of the circulatory system: Secondary | ICD-10-CM

## 2019-05-18 DIAGNOSIS — Z952 Presence of prosthetic heart valve: Secondary | ICD-10-CM

## 2019-05-18 LAB — COMPREHENSIVE METABOLIC PANEL
ALT: 76 U/L — ABNORMAL HIGH (ref 0–44)
AST: 138 U/L — ABNORMAL HIGH (ref 15–41)
Albumin: 2.5 g/dL — ABNORMAL LOW (ref 3.5–5.0)
Alkaline Phosphatase: 66 U/L (ref 38–126)
Anion gap: 13 (ref 5–15)
BUN: 63 mg/dL — ABNORMAL HIGH (ref 8–23)
CO2: 17 mmol/L — ABNORMAL LOW (ref 22–32)
Calcium: 8.2 mg/dL — ABNORMAL LOW (ref 8.9–10.3)
Chloride: 108 mmol/L (ref 98–111)
Creatinine, Ser: 1.33 mg/dL — ABNORMAL HIGH (ref 0.61–1.24)
GFR calc Af Amer: >60 mL/min (ref >60)
GFR calc non Af Amer: 52 mL/min — ABNORMAL LOW (ref >60)
Glucose, Bld: 196 mg/dL — ABNORMAL HIGH (ref 70–99)
Potassium: 3.4 mmol/L — ABNORMAL LOW (ref 3.5–5.1)
Sodium: 138 mmol/L (ref 135–145)
Total Bilirubin: 1.3 mg/dL — ABNORMAL HIGH (ref 0.3–1.2)
Total Protein: 6 g/dL — ABNORMAL LOW (ref 6.5–8.1)

## 2019-05-18 LAB — POCT I-STAT 7, (LYTES, BLD GAS, ICA,H+H)
Acid-base deficit: 6 mmol/L — ABNORMAL HIGH (ref 0.0–2.0)
Acid-base deficit: 5 mmol/L — ABNORMAL HIGH (ref 0.0–2.0)
Bicarbonate: 16.8 mmol/L — ABNORMAL LOW (ref 20.0–28.0)
Bicarbonate: 17.5 mmol/L — ABNORMAL LOW (ref 20.0–28.0)
Bicarbonate: 21.8 mmol/L (ref 20.0–28.0)
Calcium, Ion: 1.22 mmol/L (ref 1.15–1.40)
Calcium, Ion: 1.24 mmol/L (ref 1.15–1.40)
Calcium, Ion: 1.11 mmol/L — ABNORMAL LOW (ref 1.15–1.40)
HCT: 33 % — ABNORMAL LOW (ref 39.0–52.0)
HCT: 33 % — ABNORMAL LOW (ref 39.0–52.0)
HCT: 34 % — ABNORMAL LOW (ref 39.0–52.0)
Hemoglobin: 11.2 g/dL — ABNORMAL LOW (ref 13.0–17.0)
Hemoglobin: 11.2 g/dL — ABNORMAL LOW (ref 13.0–17.0)
Hemoglobin: 11.6 g/dL — ABNORMAL LOW (ref 13.0–17.0)
O2 Saturation: 95 %
O2 Saturation: 94 %
O2 Saturation: 93 %
Patient temperature: 37
Patient temperature: 37.4
Patient temperature: 37.3
Potassium: 3 mmol/L — ABNORMAL LOW (ref 3.5–5.1)
Potassium: 3.9 mmol/L (ref 3.5–5.1)
Potassium: 3.5 mmol/L (ref 3.5–5.1)
Sodium: 140 mmol/L (ref 135–145)
Sodium: 140 mmol/L (ref 135–145)
Sodium: 140 mmol/L (ref 135–145)
TCO2: 18 mmol/L — ABNORMAL LOW (ref 22–32)
TCO2: 18 mmol/L — ABNORMAL LOW (ref 22–32)
TCO2: 23 mmol/L (ref 22–32)
pCO2 arterial: 26.1 mmHg — ABNORMAL LOW (ref 32.0–48.0)
pCO2 arterial: 26.2 mmHg — ABNORMAL LOW (ref 32.0–48.0)
pCO2 arterial: 28.3 mmHg — ABNORMAL LOW (ref 32.0–48.0)
pH, Arterial: 7.418 (ref 7.350–7.450)
pH, Arterial: 7.434 (ref 7.350–7.450)
pH, Arterial: 7.497 — ABNORMAL HIGH (ref 7.350–7.450)
pO2, Arterial: 74 mmHg — ABNORMAL LOW (ref 83.0–108.0)
pO2, Arterial: 69 mmHg — ABNORMAL LOW (ref 83.0–108.0)
pO2, Arterial: 60 mmHg — ABNORMAL LOW (ref 83.0–108.0)

## 2019-05-18 LAB — CBC
HCT: 33 % — ABNORMAL LOW (ref 39.0–52.0)
Hemoglobin: 11.5 g/dL — ABNORMAL LOW (ref 13.0–17.0)
MCH: 34.4 pg — ABNORMAL HIGH (ref 26.0–34.0)
MCHC: 34.8 g/dL (ref 30.0–36.0)
MCV: 98.8 fL (ref 80.0–100.0)
Platelets: 53 10*3/uL — ABNORMAL LOW (ref 150–400)
RBC: 3.34 MIL/uL — ABNORMAL LOW (ref 4.22–5.81)
RDW: 14.5 % (ref 11.5–15.5)
WBC: 5.8 10*3/uL (ref 4.0–10.5)
nRBC: 0 % (ref 0.0–0.2)

## 2019-05-18 LAB — HAPTOGLOBIN
Haptoglobin: 254 mg/dL (ref 34–355)
Haptoglobin: 256 mg/dL (ref 34–355)

## 2019-05-18 LAB — HEPARIN LEVEL (UNFRACTIONATED)
Heparin Unfractionated: 0.62 IU/mL (ref 0.30–0.70)
Heparin Unfractionated: <0.1 IU/mL — ABNORMAL LOW (ref 0.30–0.70)
Heparin Unfractionated: <0.1 IU/mL — ABNORMAL LOW (ref 0.30–0.70)

## 2019-05-18 LAB — COOXEMETRY PANEL
Carboxyhemoglobin: 1 % (ref 0.5–1.5)
Methemoglobin: 0.8 % (ref 0.0–1.5)
O2 Saturation: 50 %
Total hemoglobin: 11.2 g/dL — ABNORMAL LOW (ref 12.0–16.0)

## 2019-05-18 LAB — PROTIME-INR
INR: 1.6 — ABNORMAL HIGH (ref 0.8–1.2)
Prothrombin Time: 18.4 seconds — ABNORMAL HIGH (ref 11.4–15.2)

## 2019-05-18 LAB — URINE CULTURE: Culture: NO GROWTH

## 2019-05-18 LAB — HIGH SENSITIVITY CRP: CRP, High Sensitivity: 259.97 mg/L — ABNORMAL HIGH (ref 0.00–3.00)

## 2019-05-18 LAB — GLUCOSE, CAPILLARY
Glucose-Capillary: 101 mg/dL — ABNORMAL HIGH (ref 70–99)
Glucose-Capillary: 158 mg/dL — ABNORMAL HIGH (ref 70–99)
Glucose-Capillary: 185 mg/dL — ABNORMAL HIGH (ref 70–99)
Glucose-Capillary: 195 mg/dL — ABNORMAL HIGH (ref 70–99)
Glucose-Capillary: 220 mg/dL — ABNORMAL HIGH (ref 70–99)
Glucose-Capillary: 228 mg/dL — ABNORMAL HIGH (ref 70–99)

## 2019-05-18 LAB — APTT: aPTT: 41 seconds — ABNORMAL HIGH (ref 24–36)

## 2019-05-18 LAB — LEGIONELLA PNEUMOPHILA SEROGP 1 UR AG: L. pneumophila Serogp 1 Ur Ag: NEGATIVE

## 2019-05-18 LAB — MAGNESIUM: Magnesium: 2.5 mg/dL — ABNORMAL HIGH (ref 1.7–2.4)

## 2019-05-18 LAB — LACTIC ACID, PLASMA: Lactic Acid, Venous: 1.2 mmol/L (ref 0.5–1.9)

## 2019-05-18 MED ORDER — FUROSEMIDE 10 MG/ML IJ SOLN
60.0000 mg | Freq: Once | INTRAMUSCULAR | Status: AC
  Administered 2019-05-18: 60 mg via INTRAVENOUS
  Filled 2019-05-18: qty 6

## 2019-05-18 MED ORDER — SODIUM CHLORIDE 0.9% FLUSH
10.0000 mL | Freq: Two times a day (BID) | INTRAVENOUS | Status: DC
  Administered 2019-05-19 – 2019-05-26 (×13): 10 mL

## 2019-05-18 MED ORDER — VANCOMYCIN HCL IN DEXTROSE 750-5 MG/150ML-% IV SOLN
750.0000 mg | Freq: Two times a day (BID) | INTRAVENOUS | Status: DC
  Administered 2019-05-18 – 2019-05-19 (×2): 750 mg via INTRAVENOUS
  Filled 2019-05-18 (×4): qty 150

## 2019-05-18 MED ORDER — SODIUM CHLORIDE 0.9 % IV SOLN
2.0000 g | Freq: Four times a day (QID) | INTRAVENOUS | Status: DC
  Administered 2019-05-18 – 2019-05-19 (×4): 2 g via INTRAVENOUS
  Filled 2019-05-18 (×2): qty 2
  Filled 2019-05-18: qty 2000
  Filled 2019-05-18: qty 2
  Filled 2019-05-18: qty 2000
  Filled 2019-05-18: qty 2

## 2019-05-18 MED ORDER — SODIUM CHLORIDE 0.9% FLUSH
10.0000 mL | INTRAVENOUS | Status: DC | PRN
  Administered 2019-05-26: 10 mL
  Filled 2019-05-18: qty 40

## 2019-05-18 MED ORDER — POTASSIUM CHLORIDE 10 MEQ/50ML IV SOLN
10.0000 meq | INTRAVENOUS | Status: AC
  Administered 2019-05-18 (×5): 10 meq via INTRAVENOUS
  Filled 2019-05-18 (×5): qty 50

## 2019-05-18 MED ORDER — POTASSIUM CHLORIDE CRYS ER 20 MEQ PO TBCR
40.0000 meq | EXTENDED_RELEASE_TABLET | ORAL | Status: AC
  Administered 2019-05-18: 40 meq via ORAL
  Administered 2019-05-18: 20 meq via ORAL
  Filled 2019-05-18 (×2): qty 2

## 2019-05-18 NOTE — Progress Notes (Signed)
Pharmacy Antibiotic Note  Justin Valencia is a 78 y.o. male admitted on May 29, 2019 with Afib and cardiogenic shock.  BCx with + enterococcus - sensitivities pending, Ucx drawn, viral panel neg Tm 101.8, wbc wnl, Cr improved 2>1.3 with inotropic support and diuresis .  Will adjustABX  with Cx data, concern for endocarditis with Hx bioprosthetic AVR/TAVR, and MV stenosis - plan TEE tomorrow Pharmacy has been consulted for Vancomycin and change cefepime  To ampicillin  dosing.  Plan: Vancomycin 1500mg  x1 yesterday and now start 750mg  Q12h Stop Cefepime  Start ampicillin 2gm IV q6h  Height: 5\' 11"  (180.3 cm) Weight: 205 lb 4 oz (93.1 kg) IBW/kg (Calculated) : 75.3  Temp (24hrs), Avg:99.7 F (37.6 C), Min:98.4 F (36.9 C), Max:101.8 F (38.8 C)  Recent Labs  Lab 05-29-19 2018 05-29-2019 2039  05/17/19 0536 05/17/19 0802 05/17/19 1152 05/17/19 1522 05/18/19 0007 05/18/19 0615  WBC 7.7  --   --   --  5.7  --   --   --  5.8  CREATININE 2.14* 2.00*  --   --  1.90*  --   --   --  1.33*  LATICACIDVEN  --   --    < > 2.3* 1.7 1.5 1.4 1.2  --    < > = values in this interval not displayed.    Estimated Creatinine Clearance: 55.9 mL/min (A) (by C-G formula based on SCr of 1.33 mg/dL (H)).    Allergies  Allergen Reactions  . Sulfamethoxazole Rash    Reported by [facility] in 2016    Antimicrobials this admission: Cefepime 9/12>9/13 Vancomycin 9/12> Ampicillin 9/13>  Dose adjustments this admission:   Microbiology results: BCx enterococcus 2/2    Bonnita Nasuti Pharm.D. CPP, BCPS Clinical Pharmacist [phone] 05/18/2019 12:14 PM

## 2019-05-18 NOTE — Progress Notes (Signed)
    Spoke to wife.  Patient's mental status is mildly improved. Appreciate advanced heart failure team-Swan quite revealing.  Blood cultures both came back positive for enterococcus.  Concerning.  TEE when able to do safely from respiratory status.  TAVR valve with increased gradients.  Increase gradient transmitral as well.  Continuing to support.  Candee Furbish, MD

## 2019-05-18 NOTE — Progress Notes (Signed)
ANTICOAGULATION CONSULT NOTE - Follow Up Consult  Pharmacy Consult for Heparin Indication: atrial fibrillation  Allergies  Allergen Reactions  . Sulfamethoxazole Rash    [facility] by [facility]    Patient Measurements: Height: 5\' 11"  (180.3 cm) Weight: 205 lb 4 oz (93.1 kg) IBW/kg (Calculated) : 75.3  Vital Signs: Temp: 99.5 F (37.5 C) (09/13 1900) BP: 108/70 (09/13 1100)  Labs: Recent Labs    06/01/2019 2018 05/23/2019 2039 05/14/2019 2310  05/17/19 0400 05/17/19 0802  05/18/19 0006 05/18/19 0106 05/18/19 0615 05/18/19 0635 05/18/19 0641 05/18/19 0817 05/18/19 1803  HGB 11.7* 12.6*  --    < >  --  12.0*   < >  --  11.2* 11.5*  --  11.2*  --   --   HCT 34.8* 37.0*  --    < >  --  34.4*   < >  --  33.0* 33.0*  --  33.0*  --   --   PLT 67*  --   --   --  65* 62*  --   --   --  53*  --   --   --   --   APTT  --   --   --   --  32  --   --   --   --   --  41*  --   --   --   LABPROT 19.2*  --   --   --  17.8*  --   --   --   --   --  18.4*  --   --   --   INR 1.6*  --   --   --  1.5*  --   --   --   --   --  1.6*  --   --   --   HEPARINUNFRC  --   --   --   --   --   --    < > <0.10*  --   --   --   --  <0.10* 0.62  CREATININE 2.14* 2.00*  --   --   --  1.90*  --   --   --  1.33*  --   --   --   --   TROPONINIHS 14,474*  --  13,617*  --   --   --   --   --   --   --   --   --   --   --    < > = values in this interval not displayed.    Estimated Creatinine Clearance: 55.9 mL/min (A) (by C-G formula based on SCr of 1.33 mg/dL (H)).   Medical History: Past Medical History:  Diagnosis Date  . Aortic stenosis    Aortic valve replacement 2006  . ASD secundum    Closure, October, 2006  . Atrial fibrillation (HCC)    Postoperative, previously treated with amiodarone and Coumadin, then stop  . Bilateral carotid artery disease (HCC)   . CAD, multiple vessel    Stress echo, January , 2012, no ischemia  . Carotid artery disease (HCC)    Bilateral, followed  closely by Dr. Imogene Burnhen ([facility]), February, 2012  . Cerebrovascular disease 12/04/2014  . Chest wall pain    October, 2012  . Chronic back pain    And sternal  . Diabetes mellitus type II   . Ejection fraction    EF, . 60-65%,echo, January,  2012  . 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 Hx of CABG    2006, aortic valve replacement, ASD closure  . Hyperlipidemia    Abnormal LFTs in the past, Lipitor discontinued, followed by Dr. Nadara Mustard he  . Hypertension   . Mitral regurgitation    Echo, January, 2012  . Nephrolithiasis   . Numbness and tingling in hands    October, 2012  . S/P aortic valve replacement    2006, bovine pericardial tissue valve /   , Normal function, echo, January, 2012    Medications:  Medications Prior to Admission  Medication Sig Dispense Refill Last Dose  . acetaminophen (TYLENOL) 500 MG tablet Take 1,000 mg by mouth every 6 (six) hours as needed for headache (pain).   06/02/2019 at am  . amLODipine (NORVASC) 5 MG tablet Take 5 mg by mouth daily.   05/23/2019 at am  . aspirin EC 81 MG tablet Take 81 mg by mouth at bedtime.   unknown  . atorvastatin (LIPITOR) 10 MG tablet Take 10 mg by mouth at bedtime.    unknown  . gemfibrozil (LOPID) 600 MG tablet Take 600 mg by mouth 2 (two) times daily.     06/04/2019 at am  . glipiZIDE (GLUCOTROL XL) 10 MG 24 hr tablet Take 10 mg by mouth daily with breakfast.   05/17/2019 at am  . Magnesium 250 MG TABS Take 250 mg by mouth at bedtime.    unknown  . Multiple Vitamin (MULTIVITAMIN WITH MINERALS) TABS tablet Take 1 tablet by mouth daily.   05/24/2019 at am  . OVER THE COUNTER MEDICATION Take 1 tablet by mouth at bedtime. Zinc 30 mg/ copper 2 mg   unknown    Assessment: 78 y.o. male with SOB/CHF/Afib on heparin -heparin level ay goal after increase to 1900 units/hr    Goal of Therapy:  Heparin level 0.3-0.7 units/ml Monitor platelets by anticoagulation protocol: Yes   Plan:  No heparin changes needed Daily HL, CBC  Hildred Laser,  PharmD Clinical Pharmacist **Pharmacist phone directory can now be found on [email or web address] ([email or web address]).  Listed under [place].

## 2019-05-18 NOTE — Progress Notes (Addendum)
Advanced Heart Failure Rounding Note   Subjective:    Swan placed on 9/12 with profound cardiogenic shock. Now on dobutamine/NE.+ bicarb gtt.  Co-ox 50%  Febrile. Bcx 2/2 enterococcus. UA +. Ucx pending   More alert this am. Talking and following commands   Received IV lasix this am with excellent urine output .Denies CP or SOB.   Head CT with likely acute lesion   Remains in AF with RVR on IV amio  Swan #s RA 12 PAP 58/32 (39) PCWP 25 Thermo CO/CI 4.3/2.0 SVR 1216  PVR 3.6  Objective:   Weight Range:  Vital Signs:   Temp:  [98.4 F (36.9 C)-101.8 F (38.8 C)] 99.5 F (37.5 C) (09/13 0930) Resp:  [12-39] 28 (09/13 0930) BP: (86-124)/(53-89) 117/72 (09/13 0800) SpO2:  [91 %-100 %] 95 % (09/13 0930) Arterial Line BP: (96-130)/(49-73) 130/73 (09/13 0930) Weight:  [93.1 kg] 93.1 kg (09/13 0500) Last BM Date: 05/22/2019  Weight change: Filed Weights   05/30/2019 2004 05/17/19 0145 05/18/19 0500  Weight: 91.6 kg 92.9 kg 93.1 kg    Intake/Output:   Intake/Output Summary (Last 24 hours) at 05/18/2019 1156 Last data filed at 05/18/2019 0900 Gross per 24 hour  Intake 3595.18 ml  Output 1185 ml  Net 2410.18 ml     Physical Exam: General:  Elderly. Frail appearing. No resp difficulty HEENT: normal Neck: supple. RIJ swan Carotids 2+ bilat; no bruits. No lymphadenopathy or thryomegaly appreciated. Cor: PMI nondisplaced. Irr tachy 2/6 AS Lungs: coarse Abdomen: soft, nontender, nondistended. No hepatosplenomegaly. No bruits or masses. Good bowel sounds. Extremities: no cyanosis, clubbing, rash, edema warm Neuro: alert & orientedx3, cranial nerves grossly intact. moves all 4 extremities w/o difficulty. Affect pleasant   Telemetry: AF 90-110  Labs: Basic Metabolic Panel: Recent Labs  Lab 05/08/2019 2018 05/15/2019 2039  05/17/19 0802 05/17/19 2218 05/18/19 0106 05/18/19 0615 05/18/19 0641  NA 132* 135   < > 136 138 140 138 140  K 3.1* 3.0*   < > 3.3* 3.0*  3.0* 3.4* 3.9  CL 102 103  --  106  --   --  108  --   CO2 16*  --   --  15*  --   --  17*  --   GLUCOSE 145* 143*  --  104*  --   --  196*  --   BUN 58* 52*  --  63*  --   --  63*  --   CREATININE 2.14* 2.00*  --  1.90*  --   --  1.33*  --   CALCIUM 9.0  --   --  8.8*  --   --  8.2*  --   MG 2.4  --   --  2.3  --   --  2.5*  --    < > = values in this interval not displayed.    Liver Function Tests: Recent Labs  Lab 05/19/2019 2018 05/17/19 0802 05/18/19 0615  AST 155* 156* 138*  ALT 56* 65* 76*  ALKPHOS 58 56 66  BILITOT 1.4* 1.6* 1.3*  PROT 6.6 6.4* 6.0*  ALBUMIN 3.1* 2.9* 2.5*   No results for input(s): LIPASE, AMYLASE in the last 168 hours. No results for input(s): AMMONIA in the last 168 hours.  CBC: Recent Labs  Lab 05/30/2019 2018  05/17/19 0400 05/17/19 0802 05/17/19 2218 05/18/19 0106 05/18/19 0615 05/18/19 0641  WBC 7.7  --   --  5.7  --   --  5.8  --   NEUTROABS 6.3  --   --   --   --   --   --   --   HGB 11.7*   < >  --  12.0* 10.9* 11.2* 11.5* 11.2*  HCT 34.8*   < >  --  34.4* 32.0* 33.0* 33.0* 33.0*  MCV 103.0*  --   --  99.7  --   --  98.8  --   PLT 67*  --  65* 62*  --   --  53*  --    < > = values in this interval not displayed.    Cardiac Enzymes: No results for input(s): CKTOTAL, CKMB, CKMBINDEX, TROPONINI in the last 168 hours.  BNP: BNP (last 3 results) No results for input(s): BNP in the last 8760 hours.  ProBNP (last 3 results) No results for input(s): PROBNP in the last 8760 hours.    Other results:  Imaging: Ct Head Wo Contrast  Result Date: 05/18/2019 CLINICAL DATA:  Altered mental status of unknown cause. EXAM: CT HEAD WITHOUT CONTRAST TECHNIQUE: Contiguous axial images were obtained from the base of the skull through the vertex without intravenous contrast. COMPARISON:  MRI 03/02/2014 FINDINGS: Brain: Age related volume loss. Chronic small-vessel ischemic changes of the hemispheric white matter. 3 cm region of low-density  affecting the right frontoparietal junction cortical brain suggesting a recent infarction. No mass lesion, hemorrhage, hydrocephalus or extra-axial collection. Vascular: There is atherosclerotic calcification of the major vessels at the base of the brain. Skull: Negative Sinuses/Orbits: Clear/normal Other: None IMPRESSION: Acute or subacute infarction at the right frontoparietal junction. Region of involvement measures about 3 cm. No evidence of mass effect or hemorrhage. Chronic small-vessel ischemic changes of the white matter elsewhere. Electronically Signed   By: Paulina FusiMark  Shogry M.D.   On: 05/18/2019 09:05   [place] Abdomen Complete  Result Date: 05/17/2019 CLINICAL DATA:  Sepsis. EXAM: ABDOMEN ULTRASOUND COMPLETE COMPARISON:  CT scan 05/05/2019 FINDINGS: Gallbladder: Surgically absent. Common bile duct: Diameter: 3 mm Liver: Diffusely coarsened hepatic echotexture suggests fatty deposition. No focal abnormality. Portal vein is patent on color Doppler imaging with normal direction of blood flow towards the liver. IVC: No abnormality visualized. Pancreas: Obscured by bowel gas. Spleen: Size and appearance within normal limits. Right Kidney: Length: 11.6 cm. Echogenicity within normal limits. No mass or hydronephrosis visualized. Left Kidney: Length: 11.6 cm. Probable 6 mm stone in the interpolar region. Echogenicity within normal limits. No mass or hydronephrosis visualized. Abdominal aorta: No aneurysm visualized. Other findings: None. IMPRESSION: 1. Diffuse coarsening of hepatic echotexture suggests steatosis. 2. Otherwise unremarkable. Electronically Signed   By: Kennith CenterEric  Mansell M.D.   On: 05/17/2019 13:08   Dg Chest Port 1 View  Result Date: 05/17/2019 CLINICAL DATA:  Central line placement EXAM: PORTABLE CHEST 1 VIEW COMPARISON:  Chest radiograph 04/17/2019 FINDINGS: Monitoring leads overlie the patient. Interval insertion PA catheter with tip projecting over the mid aspect of the right lower lobe pulmonary  artery. Stable cardiomegaly status post median sternotomy. Diffuse bilateral interstitial opacities. No definite pleural effusion or pneumothorax. IMPRESSION: PA catheter tip projects over the mid aspect of the right lower lobe pulmonary artery, consider repositioning as clinically indicated. Cardiomegaly with mild interstitial edema. These results will be called to the ordering clinician or representative by the Radiologist Assistant, and communication documented in the PACS or zVision Dashboard. Electronically Signed   By: Annia Beltrew  Davis M.D.   On: 05/17/2019 13:58   Dg Chest Portable 1 View  Result Date: 03-29-2019 CLINICAL DATA:  Weakness and shortness of breath. EXAM: PORTABLE CHEST 1 VIEW COMPARISON:  T5 14 FINDINGS: Post median sternotomy with aortic valve replacement. Cardiomegaly is similar. Unchanged mediastinal contours with aortic tortuosity. Interstitial coarsening with Kerley B-lines consistent with pulmonary edema. Possible small pleural effusions. No confluent airspace disease. No pneumothorax. Bones are under mineralized. IMPRESSION: Cardiomegaly with mild pulmonary edema. Suspect small pleural effusions. Findings consistent with CHF. Electronically Signed   By: Narda RutherfordMelanie  Sanford M.D.   On: 007-25-2020 20:24      Medications:     Scheduled Medications:  chlorhexidine  15 mL Mouth Rinse BID   Chlorhexidine Gluconate Cloth  6 each Topical Daily   insulin aspart  0-9 Units Subcutaneous Q6H   mouth rinse  15 mL Mouth Rinse BID   mouth rinse  15 mL Mouth Rinse q12n4p   pantoprazole (PROTONIX) IV  40 mg Intravenous Daily   potassium chloride  40 mEq Oral Q4H   [START ON 05/19/2019] sodium chloride flush  10-40 mL Intracatheter Q12H     Infusions:  sodium chloride 10 mL/hr at 05/18/19 0900   sodium chloride Stopped (05/18/19 0722)   amiodarone 60 mg/hr (05/18/19 1155)   ampicillin (OMNIPEN) IV     DOBUTamine 2.5 mcg/kg/min (05/18/19 0800)   heparin 1,900 Units/hr  (05/18/19 1155)   norepinephrine (LEVOPHED) Adult infusion 8 mcg/min (05/18/19 0800)   sodium bicarbonate 150 mEq in dextrose 5% 1000 mL 50 mL/hr at 05/18/19 0900   vancomycin       PRN Medications:  sodium chloride, acetaminophen, acetaminophen, influenza vaccine adjuvanted, sennosides, sodium chloride flush   Assessment/Plan:   1. Shock - Suspect combination of cardiogenic and septic shock - EF newly down 55-60% in 7/20 -> 25% here. Unclear etiology. Trop moderately up but flat. Initial suspicion was for viral myocarditis but viral panel negative. - Initial swan numbers with low output and co-ox 44% - Bcx 2/2 enterococcus - Hemodynamics improved with inotrope support - Echo suggests significantly increased gradients across VIV TAVR and MV - Continue abx and hemodynamic support. Likely can stop bicarb in am  - ID consulted - TEE tomorrow  2. Acute systolic HF - EF 16%25% see discussion above - Continue inotrope support. Ernestine Conrad- Swan numbers obtained personally - With MS. PCW does not accurately reflect LVEDP so diurese carefully  3. New onset AF with RVR  - continue amio & heparin. Rate fast so will increase amio to 60 - run heparin low with low platelets - Personally reviewed  4. Enterococcus bacteremia - await speciation. ? 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  - Doubt he is surgical candidate - head CT with new infarct -> ? Septic emboli. Consider brain MRI +/- Neuro eval  5. CAD s/p CABG - trop up but doubt ACS as it is flat - will need coronary angio if creatinine permits  6. AKI - unclear baseline - improving with inotrope support - avoid nephrotoxic agents  7. Acute hypoxic respiratory failure - improving with treatment of shock   8.  DM2 - SSI  7. Acute encephalopathy.  - likely metabolic - check ammonia   8 Aortic stenosis - s/p SAVR 2006 - s/p TAVR 2016  9. Hypokalemia - supp  10.  Thrombocytopenia - suspect related to sepsis +/- valvular disease  CRITICAL CARE Performed by: Glori Bickers  Total critical care time: 55 minutes  Critical care time was exclusive of separately billable procedures and treating other patients.  Critical care was necessary to treat or prevent imminent or life-threatening deterioration.  Critical care was time spent personally by me (independent of midlevel providers or residents) on the following activities: development of treatment plan with patient and/or surrogate as well as nursing, discussions with consultants, evaluation of patient's response to treatment, examination of patient, obtaining history from patient or surrogate, ordering and performing treatments and interventions, ordering and review of laboratory studies, ordering and review of radiographic studies, pulse oximetry and re-evaluation of patient's condition.     Length of Stay: 1   Glori Bickers MD 05/18/2019, 11:56 AM  Advanced Heart Failure Team Pager [phone] (M-F; [place])  Please contact [facility] for night-coverage after hours (4p -7a ) and weekends on [email or web address]

## 2019-05-18 NOTE — Progress Notes (Signed)
ANTICOAGULATION CONSULT NOTE - Follow Up Consult  Pharmacy Consult for Heparin Indication: atrial fibrillation  Allergies  Allergen Reactions  . Sulfamethoxazole Rash    [facility] by [facility] 2016    Patient Measurements: Height: 5\' 11"  (180.3 cm) Weight: 205 lb 4 oz (93.1 kg) IBW/kg (Calculated) : 75.3  Vital Signs: Temp: 99.5 F (37.5 C) (09/13 0930) Temp Source: Core (09/13 0400) BP: 117/72 (09/13 0800)  Labs: Recent Labs    May 01, 2019 2018 May 01, 2019 2039 May 01, 2019 2310  05/17/19 0400 05/17/19 0802 05/17/19 1152  05/18/19 0006 05/18/19 0106 05/18/19 0615 05/18/19 0635 05/18/19 0641 05/18/19 0817  HGB 11.7* 12.6*  --    < >  --  12.0*  --    < >  --  11.2* 11.5*  --  11.2*  --   HCT 34.8* 37.0*  --    < >  --  34.4*  --    < >  --  33.0* 33.0*  --  33.0*  --   PLT 67*  --   --   --  65* 62*  --   --   --   --  53*  --   --   --   APTT  --   --   --   --  32  --   --   --   --   --   --  41*  --   --   LABPROT 19.2*  --   --   --  17.8*  --   --   --   --   --   --  18.4*  --   --   INR 1.6*  --   --   --  1.5*  --   --   --   --   --   --  1.6*  --   --   HEPARINUNFRC  --   --   --   --   --   --  <0.10*  --  <0.10*  --   --   --   --  <0.10*  CREATININE 2.14* 2.00*  --   --   --  1.90*  --   --   --   --  1.33*  --   --   --   TROPONINIHS 14,474*  --  13,617*  --   --   --   --   --   --   --   --   --   --   --    < > = values in this interval not displayed.    Estimated Creatinine Clearance: 55.9 mL/min (A) (by C-G formula based on SCr of 1.33 mg/dL (H)).   Medical History: Past Medical History:  Diagnosis Date  . Aortic stenosis    Aortic valve replacement 2006  . ASD secundum    Closure, October, 2006  . Atrial fibrillation (HCC)    Postoperative, previously treated with amiodarone and Coumadin, then stop  . Bilateral carotid artery disease (HCC)   . CAD, multiple vessel    Stress echo, January , 2012, no ischemia  . Carotid artery  disease (HCC)    Bilateral, followed closely by Dr. Imogene Burnhen ([facility]), February, 2012  . Cerebrovascular disease 12/04/2014  . Chest wall pain    October, 2012  . Chronic back pain    And sternal  . Diabetes mellitus type II   . Ejection fraction  EF, . 60-65%,echo, January, 2012  . 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 Hx of CABG    2006, aortic valve replacement, ASD closure  . Hyperlipidemia    Abnormal LFTs in the past, Lipitor discontinued, followed by Dr. Nadara Mustard he  . Hypertension   . Mitral regurgitation    Echo, January, 2012  . Nephrolithiasis   . Numbness and tingling in hands    October, 2012  . S/P aortic valve replacement    2006, bovine pericardial tissue valve /   , Normal function, echo, January, 2012    Medications:  Medications Prior to Admission  Medication Sig Dispense Refill Last Dose  . acetaminophen (TYLENOL) 500 MG tablet Take 1,000 mg by mouth every 6 (six) hours as needed for headache (pain).   2019-06-12 at am  . amLODipine (NORVASC) 5 MG tablet Take 5 mg by mouth daily.   2019/06/12 at am  . aspirin EC 81 MG tablet Take 81 mg by mouth at bedtime.   unknown  . atorvastatin (LIPITOR) 10 MG tablet Take 10 mg by mouth at bedtime.    unknown  . gemfibrozil (LOPID) 600 MG tablet Take 600 mg by mouth 2 (two) times daily.     06-12-19 at am  . glipiZIDE (GLUCOTROL XL) 10 MG 24 hr tablet Take 10 mg by mouth daily with breakfast.   06/12/19 at am  . Magnesium 250 MG TABS Take 250 mg by mouth at bedtime.    unknown  . Multiple Vitamin (MULTIVITAMIN WITH MINERALS) TABS tablet Take 1 tablet by mouth daily.   2019-06-12 at am  . OVER THE COUNTER MEDICATION Take 1 tablet by mouth at bedtime. Zinc 30 mg/ copper 2 mg   unknown    Assessment: 78 y.o. male with SOB/CHF/Afib for heparin  Heparin drip 1750 uts/hr HL < 0.1 less than goal -concerned that patient is requiring increased drip rate 19uts/kg/hr without response RN and I checked IV line - heparin infusing properly in LUE PIV no  infiltration noted.  HL drawn from CL.    H/h stable, no bleeding noted   - pltc 50s in setting of sepsis   - discussed with MD - continue heparin  Goal of Therapy:  Heparin level 0.3-0.7 units/ml Monitor platelets by anticoagulation protocol: Yes   Plan:  Increase heparin drip 1900 uts/hr  Check heparin level in 6hr from restart Daily HL, CBC  Bonnita Nasuti Pharm.D. CPP, BCPS Clinical Pharmacist [phone] 05/18/2019 12:10 PM

## 2019-05-18 NOTE — Progress Notes (Signed)
[place] for Heparin Indication: atrial fibrillation  Allergies  Allergen Reactions  . Sulfamethoxazole Rash    Reported by [facility] in 2016    Patient Measurements: Height: 5\' 11"  (180.3 cm) Weight: 204 lb 12.9 oz (92.9 kg) IBW/kg (Calculated) : 75.3  Vital Signs: Temp: 98.6 F (37 C) (09/13 0000) Temp Source: Core (09/13 0000) BP: 115/57 (09/13 0000)  Labs: Recent Labs    June 09, 2019 2018 09-Jun-2019 2039 09-Jun-2019 2310 05/17/19 0109 05/17/19 0400 05/17/19 0802 05/17/19 1152 05/17/19 2218 05/18/19 0006  HGB 11.7* 12.6*  --  11.6*  --  12.0*  --  10.9*  --   HCT 34.8* 37.0*  --  34.0*  --  34.4*  --  32.0*  --   PLT 67*  --   --   --  65* 62*  --   --   --   APTT  --   --   --   --  32  --   --   --   --   LABPROT 19.2*  --   --   --  17.8*  --   --   --   --   INR 1.6*  --   --   --  1.5*  --   --   --   --   HEPARINUNFRC  --   --   --   --   --   --  <0.10*  --  <0.10*  CREATININE 2.14* 2.00*  --   --   --  1.90*  --   --   --   TROPONINIHS 14,474*  --  70,623*  --   --   --   --   --   --     Estimated Creatinine Clearance: 39.1 mL/min (A) (by C-G formula based on SCr of 1.9 mg/dL (H)).   Assessment: 78 y.o. male with SOB/CHF/Afib for heparin  Goal of Therapy:  Heparin level 0.3-0.7 units/ml Monitor platelets by anticoagulation protocol: Yes   Plan:  Increase Heparin  1750 units/hr Check heparin level in 8 hours.   Phillis Knack, PharmD, BCPS  05/18/2019 12:40 AM

## 2019-05-18 NOTE — Consult Note (Signed)
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 Enterococcal sepsis - The patient is extremely high risk for prosthetic valve endocarditis, so I agree with proceeding with a transesophageal echocardiogram tomorrow to further characterize his pathology.  Recognizing that the patient may be a poor surgical candidate, if infection to his valve is confirmed to the aortic valve position where his prior surgeries and TAVR device are seated, he he still likely should be assessed by the cardiac surgery service for consideration of formal valve replacement.  The presence of what appears to be a new CNS infarct is concerning for septic embolization, which in most circumstances again prompts at least a cardiac surgery evaluation.  Continue the patient's vancomycin dosed for goal trough of 15-20 and change his cefepime to ampicillin while sensitivities are pending.  Repeat blood cultures x2 to assess for clearance of his BSI.  If the patient fails to clear his bacteremia promptly, we may need to consider adding synergistic gentamicin and work to remove invasive lines placed while he was bacteremic.  The addition of synergistic gentamicin would likely bring significant renal toxicity in a patient with unstable hemodynamics and advanced age with questionable chronic renal insufficiency per history.  Furthermore, if he does  have evidence of prosthetic valve endocarditis, would consider adding rifampin to his medical regimen as well.  Rifampin would likely interact with his amiodarone and other possible cardiac medications, however.  We can certainly attempt medical treatment, but I am pessimistic regarding cure of prosthetic valve endocarditis with medical monotherapy, even with a prolonged 6-week course.   2. Fever -most likely secondary to the patient's Enterococcal sepsis.  Repeat blood cultures x2 today to assess for clearance of his bacteremia following changes in antibiotic therapy as above.  Would have a low threshold for repeating the patient's chest x-ray as he will remain at risk for florid CHF and secondary pneumonia as well.   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  Antibiotics: Vancomycin, day 2 Cefepime, day 2  HPI: Justin Valencia is a 78 y.o. 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  Per the wife's report, patient was becoming increasingly lethargic at home and ultimately was unable to rise off the commode following voiding.  The patient and his wife deny any recent constipation or GI bleeding issues.  On presentation here, the patient was found to be in presumed cardiogenic shock requiring multiple pressors and placement of a Swan-Ganz catheter.  Yesterday, he was febrile to 101.5 so blood cultures were obtained which are now positive for Enterococcus species on both sets.  He developed Afib  with RVR requiring an amiodarone as well as sodium bicarbonate drips.  He currently is on Levophed 8 mcg/min and dobutamine 2.5 mcg/min due to low readings on his coax catheter (his MAP has been preserved per nursing).  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  He was empirically started on vancomycin and cefepime yesterday when febrile.  Following my discussion with Dr. Gala RomneyBensimhon, he will be continuing on vancomycin and ampicillin instead while sensitivities are currently pending for his enterococcus.  Of note, the majority of the patient's history was obtained from his wife as the patient is profoundly weak and also extremely hard of hearing, making it difficult for him to participate in an interview. Fever curve, WBC & Cr trends, echocardiograms, BCx results, imaging, and ABX usage all independently reviewed.  Review of Systems:  Review of Systems  Constitutional: Positive for chills, fever and malaise/fatigue. Negative for weight loss.  HENT: Negative for congestion, hearing loss, sinus pain and sore throat.   Eyes: Negative for blurred vision, photophobia and discharge.  Respiratory: Positive for shortness of breath. Negative for cough and hemoptysis.   Cardiovascular: Negative for chest pain, palpitations, orthopnea and leg swelling.  Gastrointestinal: Negative for abdominal pain, constipation, diarrhea, heartburn, nausea and vomiting.  Genitourinary: Negative for dysuria, flank pain, frequency and urgency.  Musculoskeletal: Negative for back pain,  joint pain and myalgias.  Skin: Negative for itching and rash.  Neurological: Positive for weakness. Negative for tremors, seizures and headaches.  Endo/Heme/Allergies: Negative for polydipsia. Does not bruise/bleed easily.  Psychiatric/Behavioral: Negative for depression and substance abuse. The patient is not nervous/anxious and does not have insomnia.      All other systems reviewed and are negative    Past Medical History:  Diagnosis Date   Aortic stenosis    Aortic valve replacement 2006   ASD secundum    Closure, October, 2006   Atrial fibrillation (HCC)    Postoperative, previously treated with amiodarone and Coumadin, then stop   Bilateral carotid artery disease (HCC)    CAD, multiple vessel    Stress echo, January , 2012, no ischemia   Carotid artery disease (HCC)    Bilateral, followed closely by Dr. Imogene Burnhen ([facility]), February, 2012   Cerebrovascular disease 12/04/2014   Chest wall pain    October, 2012   Chronic back pain    And sternal   Diabetes mellitus type II    Ejection fraction    EF, . 60-65%,echo, January, 2012   Gait disorder 12/04/2014   Hx of CABG    2006, aortic valve replacement, ASD closure   Hyperlipidemia    Abnormal LFTs in the past, Lipitor discontinued, followed by Dr. Dimas AguasHoward he   Hypertension    Mitral regurgitation    Echo, January, 2012   Nephrolithiasis    Numbness and tingling  in hands    October, 2012   S/P aortic valve replacement    2006, bovine pericardial tissue valve /   , Normal function, echo, January, 2012    Social History   Tobacco Use   Smoking status: Former Smoker    Packs/day: 0.30    Years: 40.00    Pack years: 12.00    Types: Cigarettes    Quit date: 09/04/2004    Years since quitting: 14.7   Smokeless tobacco: Never Used  Substance Use Topics   Alcohol use: No   Drug use: No  The patient lives with his wife of 56 years in [place], [place]. His prior cardiac surgeries and TAVR have been performed at  [facility].  Family History  Problem Relation Age of Onset   Heart attack Mother    Heart failure Father    COPD Father    Healthy Sister    Healthy Brother    Healthy Sister    Healthy Sister    Healthy Brother      Current Facility-Administered Medications:    0.9 %  sodium chloride infusion, 250 mL, Intravenous, Continuous, Ralene Ok, MD, Last Rate: 10 mL/hr at 05/18/19 0900   0.9 %  sodium chloride infusion, , Intravenous, PRN, Bensimhon, Bevelyn Buckles, MD, Stopped at 05/18/19 7829   acetaminophen (TYLENOL) suppository 650 mg, 650 mg, Rectal, Q4H PRN, Jake Bathe, MD, 650 mg at 05/17/19 1700   acetaminophen (TYLENOL) tablet 650 mg, 650 mg, Oral, Q4H PRN, Ralene Ok, MD   [COMPLETED] amiodarone (NEXTERONE) 1.8 mg/mL load via infusion 150 mg, 150 mg, Intravenous, Once, 150 mg at 05/17/19 1045 **FOLLOWED BY** [EXPIRED] amiodarone (NEXTERONE PREMIX) 360-4.14 MG/200ML-% (1.8 mg/mL) IV infusion, 60 mg/hr, Intravenous, Continuous, Stopped at 05/17/19 1616 **FOLLOWED BY** amiodarone (NEXTERONE PREMIX) 360-4.14 MG/200ML-% (1.8 mg/mL) IV infusion, 60 mg/hr, Intravenous, Continuous, Skains, Mark C, MD, Last Rate: 33.3 mL/hr at 05/18/19 1155, 60 mg/hr at 05/18/19 1155   ampicillin (OMNIPEN) 2 g in sodium chloride 0.9 % 100 mL IVPB, 2 g, Intravenous, Q6H, Skains, Mark C, MD   chlorhexidine (PERIDEX) 0.12 % solution 15 mL, 15 mL, Mouth Rinse, BID, Skains, Mark C, MD, 15 mL at 05/18/19 1000   Chlorhexidine Gluconate Cloth 2 % PADS 6 each, 6 each, Topical, Daily, Jake Bathe, MD, 6 each at 05/17/19 2230   DOBUTamine (DOBUTREX) infusion 4000 mcg/mL, 2.5 mcg/kg/min, Intravenous, Titrated, Ralene Ok, MD, Last Rate: 3.48 mL/hr at 05/18/19 0800, 2.5 mcg/kg/min at 05/18/19 0800   furosemide (LASIX) injection 60 mg, 60 mg, Intravenous, Once, Bensimhon, Bevelyn Buckles, MD   heparin ADULT infusion 100 units/mL (25000 units/263mL sodium chloride 0.45%), 1,900 Units/hr, Intravenous,  Continuous, Skains, Mark C, MD, Last Rate: 19 mL/hr at 05/18/19 1155, 1,900 Units/hr at 05/18/19 1155   influenza vaccine adjuvanted (FLUAD) injection 0.5 mL, 0.5 mL, Intramuscular, Prior to discharge, Jake Bathe, MD   insulin aspart (novoLOG) injection 0-9 Units, 0-9 Units, Subcutaneous, Q6H, Ralene Ok, MD, 2 Units at 05/18/19 5621   MEDLINE mouth rinse, 15 mL, Mouth Rinse, BID, Skains, Mark C, MD, 15 mL at 05/18/19 0900   MEDLINE mouth rinse, 15 mL, Mouth Rinse, q12n4p, Skains, Mark C, MD, 15 mL at 05/18/19 1145   norepinephrine (LEVOPHED) 16 mg in premix infusion, 8 mcg/min, Intravenous, Titrated, Bensimhon, Bevelyn Buckles, MD, Last Rate: 7.5 mL/hr at 05/18/19 0800, 8 mcg/min at 05/18/19 0800   pantoprazole (PROTONIX) injection 40 mg, 40 mg, Intravenous, Daily, Ralene Ok, MD, 40 mg at 05/18/19 1100  potassium chloride SA (K-DUR) CR tablet 40 mEq, 40 mEq, Oral, Q4H, Skains, Mark C, MD   sennosides (SENOKOT) 8.8 MG/5ML syrup 5 mL, 5 mL, Oral, BID PRN, Ralene Ok, MD   sodium bicarbonate 150 mEq in dextrose 5% 1000 mL infusion, 150 mEq, Intravenous, Continuous, Bensimhon, Bevelyn Buckles, MD, Last Rate: 50 mL/hr at 05/18/19 0900   [START ON 05/19/2019] sodium chloride flush (NS) 0.9 % injection 10-40 mL, 10-40 mL, Intracatheter, Q12H, Bensimhon, Bevelyn Buckles, MD   sodium chloride flush (NS) 0.9 % injection 10-40 mL, 10-40 mL, Intracatheter, PRN, Bensimhon, Bevelyn Buckles, MD   vancomycin (VANCOCIN) IVPB 750 mg/150 ml premix, 750 mg, Intravenous, Q12H, Skains, Veverly Fells, MD  Allergies  Allergen Reactions   Sulfamethoxazole Rash    Reported by [facility] in 2016    Vitals:   05/18/19 0900 05/18/19 0930  BP:    Pulse:    Resp: (!) 32 (!) 28  Temp:  99.5 F (37.5 C)  SpO2: 91% 95%     Physical Exam Lines: RT IJ Swan-Ganz, Foley, PIV x 2, LT radial A-line Gen: chronically ill WM in moderate respiratory distress who is extremely hard of hearing but alert and oriented x  2 Head: NCAT, no temporal wasting evident EENT: PERRL, EOMI, slight LT sided ptosis, MMM, adequate dentition Neck: supple, moderate JVD CV: tachycardic rate, irregular rate and rhythm, II/VI SEM at LLSB and II/VI diastolic murmur loudest at apex Pulm: CTA bilaterally except scant bibasilar crackles, frequent retractions with nasal flaring, no expiratory wheeze appreciated Abd: soft, NTND, +BS Extrems:  1+ pitting LE edema, 1+ pulses Skin: no rashes, adequate skin turgor Neuro: CN II-XII grossly intact with the exception of extremely poor hearing, ? LT sided facial droop and ptosis, gait was not assessed, A&Ox 2  Lab Results  Component Value Date   WBC 5.8 05/18/2019   HGB 11.2 (L) 05/18/2019   HCT 33.0 (L) 05/18/2019   MCV 98.8 05/18/2019   PLT 53 (L) 05/18/2019    Lab Results  Component Value Date   CREATININE 1.33 (H) 05/18/2019   BUN 63 (H) 05/18/2019   NA 140 05/18/2019   K 3.9 05/18/2019   CL 108 05/18/2019   CO2 17 (L) 05/18/2019    Lab Results  Component Value Date   ALT 76 (H) 05/18/2019   AST 138 (H) 05/18/2019   ALKPHOS 66 05/18/2019    CT head on 05/18/2019 IMPRESSION: Acute or subacute infarction at the right frontoparietal junction. Region of involvement measures about 3 cm. No evidence of mass effect or hemorrhage.  Chronic small-vessel ischemic changes of the white matter elsewhere.   Electronically Signed   By: Paulina Fusi M.D.   On: 05/18/2019 09:05  CXR on 05/14/2019 IMPRESSION: Cardiomegaly with mild pulmonary edema. Suspect small pleural effusions. Findings consistent with CHF.   Electronically Signed   By: Narda Rutherford M.D.   On: 05/22/2019 20:24  TTE on 05/17/2019 IMPRESSIONS    1. - TAVR: There is a 23 mm corevalve present (ViV 07/01/2015 @ [facility]) in the aortic position. There is mild paravalvular leak present. 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 The peak/mean gradient across the valve is 54/32 mmHg. EOA  by  continuity ~1.06 cm2. I reviewed the records from [facility] and gradients were reported as peak/mean 53/30 mmHG 03/21/2019. His mean gradients after valve implantation in 2017 were reported to be ~17 mmHg. Overall, there is concern for severe prosthetic  valve stenosis. The AT is ~100  msec with rounded jet contour concerning for an obstructive process. Would consider a TEE for better characterization if clinically indicated.  2. The left ventricle has severely reduced systolic function, with an ejection fraction of 20-25%. The cavity size was normal. Left ventricular diastolic function could not be evaluated secondary to atrial fibrillation. There is abnormal septal motion  consistent with left bundle branch block.  3. LVEDP estimated ~28 mmHg based on AR signal. LAP ~40 mmHg with gradient across MV.  4. The right ventricle has severely reduced systolic function. The cavity was normal. There is no increase in right ventricular wall thickness.  5. Left atrial size was severely dilated.  6. The mitral valve is degenerative. Moderate thickening of the mitral valve leaflet. Severe calcification of the mitral valve leaflet. There is moderate to severe mitral annular calcification present. Mitral valve regurgitation is mild to moderate by  color flow Doppler. Moderate-severe mitral valve stenosis, with a calculated valve area of 1.76 cm.  7. There is concerns for moderate to severe mitral stenosis. The mean gradient across the mitral valve is ~13 mmHg with HR ~105 bpm. This appears to be degenerative mitral valve disease.  8. The tricuspid valve is grossly normal.  9. A 23mm CoreValve-Evolut Pro bioprosthetic aortic valve (TAVR) valve is present in the aortic position. Procedure Date: 07/01/2015 @ [facility]. 10. The aorta is normal unless otherwise noted. 11. The aortic root is normal in size and structure. 12. When compared to the prior study: Comparisons made using reports in Care Everywhere from [facility].  Unable to view images.  Microbiology: Recent Results (from the past 240 hour(s))  SARS Coronavirus 2 [facility] order, Performed in [facility] hospital lab) Nasopharyngeal Nasopharyngeal Swab     Status: None   Collection Time: 06-15-2019  8:20 PM   Specimen: Nasopharyngeal Swab  Result Value Ref Range Status   SARS Coronavirus 2 NEGATIVE NEGATIVE Final    Comment: (NOTE) If result is NEGATIVE SARS-CoV-2 target nucleic acids are NOT DETECTED. The SARS-CoV-2 RNA is generally detectable in upper and lower  respiratory specimens during the acute phase of infection. The lowest  concentration of SARS-CoV-2 viral copies this assay can detect is 250  copies / mL. A negative result does not preclude SARS-CoV-2 infection  and should not be used as the sole basis for treatment or other  patient management decisions.  A negative result may occur with  improper specimen collection / handling, submission of specimen other  than nasopharyngeal swab, presence of viral mutation(s) within the  areas targeted by this assay, and inadequate number of viral copies  (<250 copies / mL). A negative result must be combined with clinical  observations, patient history, and epidemiological information. If result is POSITIVE SARS-CoV-2 target nucleic acids are DETECTED. The SARS-CoV-2 RNA is generally detectable in upper and lower  respiratory specimens dur ing the acute phase of infection.  Positive  results are indicative of active infection with SARS-CoV-2.  Clinical  correlation with patient history and other diagnostic information is  necessary to determine patient infection status.  Positive results do  not rule out bacterial infection or co-infection with other viruses. If result is PRESUMPTIVE POSTIVE SARS-CoV-2 nucleic acids MAY BE PRESENT.   A presumptive positive result was obtained on the submitted specimen  and confirmed on repeat testing.  While 2019 novel coronavirus  (SARS-CoV-2) nucleic acids  may be present in the submitted sample  additional confirmatory testing may be necessary for epidemiological  and / or clinical management  purposes  to differentiate between  SARS-CoV-2 and other Sarbecovirus currently known to infect humans.  If clinically indicated additional testing with an alternate test  methodology [phone]) is advised. The SARS-CoV-2 RNA is generally  detectable in upper and lower respiratory sp ecimens during the acute  phase of infection. The expected result is Negative. Fact Sheet for Patients:  BoilerBrush.com.cy Fact Sheet for Healthcare Providers: https://pope.com/ This test is not yet approved or cleared by the [place] FDA and has been authorized for detection and/or diagnosis of SARS-CoV-2 by FDA under an Emergency Use Authorization (EUA).  This EUA will remain in effect (meaning this test can be used) for the duration of the COVID-19 declaration under Section 564(b)(1) of the Act, 21 U.S.C. section 360bbb-3(b)(1), unless the authorization is terminated or revoked sooner. Performed at [facility], [address], [place], [place] [address]   Respiratory Panel by PCR     Status: None   Collection Time: 05/17/19  2:03 AM   Specimen: Respiratory  Result Value Ref Range Status   Adenovirus NOT DETECTED NOT DETECTED Final   Coronavirus 229E NOT DETECTED NOT DETECTED Final    Comment: (NOTE) The Coronavirus on the Respiratory Panel, DOES NOT test for the novel  Coronavirus (2019 nCoV)    Coronavirus HKU1 NOT DETECTED NOT DETECTED Final   Coronavirus NL63 NOT DETECTED NOT DETECTED Final   Coronavirus OC43 NOT DETECTED NOT DETECTED Final   Metapneumovirus NOT DETECTED NOT DETECTED Final   Rhinovirus / Enterovirus NOT DETECTED NOT DETECTED Final   Influenza A NOT DETECTED NOT DETECTED Final   Influenza B NOT DETECTED NOT DETECTED Final   Parainfluenza Virus 1 NOT DETECTED NOT DETECTED Final    Parainfluenza Virus 2 NOT DETECTED NOT DETECTED Final   Parainfluenza Virus 3 NOT DETECTED NOT DETECTED Final   Parainfluenza Virus 4 NOT DETECTED NOT DETECTED Final   Respiratory Syncytial Virus NOT DETECTED NOT DETECTED Final   Bordetella pertussis NOT DETECTED NOT DETECTED Final   Chlamydophila pneumoniae NOT DETECTED NOT DETECTED Final   Mycoplasma pneumoniae NOT DETECTED NOT DETECTED Final    Comment: Performed at [facility], [address], [place], [place] [address]  MRSA PCR Screening     Status: None   Collection Time: 05/17/19  2:03 AM   Specimen: Nasopharyngeal  Result Value Ref Range Status   MRSA by PCR NEGATIVE NEGATIVE Final    Comment:        The GeneXpert MRSA Assay (FDA approved for NASAL specimens only), is one component of a comprehensive MRSA colonization surveillance program. It is not intended to diagnose MRSA infection nor to guide or monitor treatment for MRSA infections. Performed at [facility], [address] [place], [place] [address]   Culture, blood (routine x 2)     Status: None (Preliminary result)   Collection Time: 05/17/19  2:40 AM   Specimen: BLOOD LEFT HAND  Result Value Ref Range Status   Specimen Description BLOOD LEFT HAND  Final   Special Requests   Final    BOTTLES DRAWN AEROBIC ONLY Blood Culture results may not be optimal due to an inadequate volume of blood received in culture bottles   Culture  Setup Time   Final    GRAM POSITIVE COCCI IN PAIRS AND CHAINS AEROBIC BOTTLE ONLY CRITICAL RESULT CALLED TO, READ BACK BY AND VERIFIED WITH: A. MEYER, PHARMD AT 1935 ON 05/17/19 BY C. JESSUP, MT. Performed at [facility], [address] [place], [place] [address]    Culture GRAM POSITIVE COCCI  Final   Report Status PENDING  Incomplete  Culture, blood (routine x 2)     Status: None (Preliminary result)   Collection Time: 05/17/19  2:44 AM   Specimen: BLOOD RIGHT HAND  Result Value Ref Range Status   Specimen  Description BLOOD RIGHT HAND  Final   Special Requests   Final    BOTTLES DRAWN AEROBIC ONLY Blood Culture results may not be optimal due to an inadequate volume of blood received in culture bottles   Culture  Setup Time   Final    GRAM POSITIVE COCCI IN PAIRS AND CHAINS AEROBIC BOTTLE ONLY CRITICAL RESULT CALLED TO, READ BACK BY AND VERIFIED WITH: A. MEYER, PHARMD AT 1935 ON 05/17/19 BY C. JESSUP, MT. Performed at [facility], [place] [address], [place], [place] [address]    Culture GRAM POSITIVE COCCI  Final   Report Status PENDING  Incomplete  Blood Culture ID Panel (Reflexed)     Status: Abnormal   Collection Time: 05/17/19  2:44 AM  Result Value Ref Range Status   Enterococcus species DETECTED (A) NOT DETECTED Final    Comment: CRITICAL RESULT CALLED TO, READ BACK BY AND VERIFIED WITH: A. MEYER, PHARMD AT 1935 ON 05/17/19 BY C. JESSUP, MT.    Vancomycin resistance NOT DETECTED NOT DETECTED Final   Listeria monocytogenes NOT DETECTED NOT DETECTED Final   Staphylococcus species NOT DETECTED NOT DETECTED Final   Staphylococcus aureus (BCID) NOT DETECTED NOT DETECTED Final   Streptococcus species NOT DETECTED NOT DETECTED Final   Streptococcus agalactiae NOT DETECTED NOT DETECTED Final   Streptococcus pneumoniae NOT DETECTED NOT DETECTED Final   Streptococcus pyogenes NOT DETECTED NOT DETECTED Final   Acinetobacter baumannii NOT DETECTED NOT DETECTED Final   Enterobacteriaceae species NOT DETECTED NOT DETECTED Final   Enterobacter cloacae complex NOT DETECTED NOT DETECTED Final   Escherichia coli NOT DETECTED NOT DETECTED Final   Klebsiella oxytoca NOT DETECTED NOT DETECTED Final   Klebsiella pneumoniae NOT DETECTED NOT DETECTED Final   Proteus species NOT DETECTED NOT DETECTED Final   Serratia marcescens NOT DETECTED NOT DETECTED Final   Haemophilus influenzae NOT DETECTED NOT DETECTED Final   Neisseria meningitidis NOT DETECTED NOT DETECTED Final   Pseudomonas aeruginosa NOT  DETECTED NOT DETECTED Final   Candida albicans NOT DETECTED NOT DETECTED Final   Candida glabrata NOT DETECTED NOT DETECTED Final   Candida krusei NOT DETECTED NOT DETECTED Final   Candida parapsilosis NOT DETECTED NOT DETECTED Final   Candida tropicalis NOT DETECTED NOT DETECTED Final    Comment: Performed at [facility], [address], [place], [place] [address]   70 minutes of critical care time spent on direct pt care today  Janine Ores, [facility] [place] [email or web address] 05/18/2019, 12:45 PM

## 2019-05-18 NOTE — Plan of Care (Signed)
  Problem: Education: Goal: Knowledge of General Education information will improve Description: Including pain rating scale, medication(s)/side effects and non-pharmacologic comfort measures Outcome: Not Progressing   Problem: Health Behavior/Discharge Planning: Goal: Ability to manage health-related needs will improve Outcome: Not Progressing   Problem: Education: Goal: Ability to demonstrate management of disease process will improve Outcome: Not Progressing   Problem: Clinical Measurements: Goal: Diagnostic test results will improve Outcome: Progressing   Problem: Pain Managment: Goal: General experience of comfort will improve Outcome: Progressing

## 2019-05-18 NOTE — Plan of Care (Signed)
  Problem: Clinical Measurements: Goal: Diagnostic test results will improve Outcome: Progressing   Problem: Pain Managment: Goal: General experience of comfort will improve Outcome: Progressing   

## 2019-05-19 ENCOUNTER — Inpatient Hospital Stay: Payer: Self-pay

## 2019-05-19 ENCOUNTER — Inpatient Hospital Stay (HOSPITAL_COMMUNITY): Payer: Medicare HMO

## 2019-05-19 DIAGNOSIS — J9601 Acute respiratory failure with hypoxia: Secondary | ICD-10-CM

## 2019-05-19 DIAGNOSIS — Z960 Presence of urogenital implants: Secondary | ICD-10-CM

## 2019-05-19 DIAGNOSIS — H9190 Unspecified hearing loss, unspecified ear: Secondary | ICD-10-CM

## 2019-05-19 DIAGNOSIS — I639 Cerebral infarction, unspecified: Secondary | ICD-10-CM

## 2019-05-19 LAB — CBC
HCT: 32.2 % — ABNORMAL LOW (ref 39.0–52.0)
Hemoglobin: 11.3 g/dL — ABNORMAL LOW (ref 13.0–17.0)
MCH: 35 pg — ABNORMAL HIGH (ref 26.0–34.0)
MCHC: 35.1 g/dL (ref 30.0–36.0)
MCV: 99.7 fL (ref 80.0–100.0)
Platelets: 56 10*3/uL — ABNORMAL LOW (ref 150–400)
RBC: 3.23 MIL/uL — ABNORMAL LOW (ref 4.22–5.81)
RDW: 14.6 % (ref 11.5–15.5)
WBC: 8.2 10*3/uL (ref 4.0–10.5)
nRBC: 0 % (ref 0.0–0.2)

## 2019-05-19 LAB — COMPREHENSIVE METABOLIC PANEL
ALT: 69 U/L — ABNORMAL HIGH (ref 0–44)
AST: 95 U/L — ABNORMAL HIGH (ref 15–41)
Albumin: 2.5 g/dL — ABNORMAL LOW (ref 3.5–5.0)
Alkaline Phosphatase: 53 U/L (ref 38–126)
Anion gap: 13 (ref 5–15)
BUN: 52 mg/dL — ABNORMAL HIGH (ref 8–23)
CO2: 22 mmol/L (ref 22–32)
Calcium: 7.8 mg/dL — ABNORMAL LOW (ref 8.9–10.3)
Chloride: 104 mmol/L (ref 98–111)
Creatinine, Ser: 1.09 mg/dL (ref 0.61–1.24)
GFR calc Af Amer: >60 mL/min (ref >60)
GFR calc non Af Amer: >60 mL/min (ref >60)
Glucose, Bld: 205 mg/dL — ABNORMAL HIGH (ref 70–99)
Potassium: 3.5 mmol/L (ref 3.5–5.1)
Sodium: 139 mmol/L (ref 135–145)
Total Bilirubin: 1.3 mg/dL — ABNORMAL HIGH (ref 0.3–1.2)
Total Protein: 5.9 g/dL — ABNORMAL LOW (ref 6.5–8.1)

## 2019-05-19 LAB — GLUCOSE, CAPILLARY
Glucose-Capillary: 162 mg/dL — ABNORMAL HIGH (ref 70–99)
Glucose-Capillary: 165 mg/dL — ABNORMAL HIGH (ref 70–99)
Glucose-Capillary: 165 mg/dL — ABNORMAL HIGH (ref 70–99)
Glucose-Capillary: 168 mg/dL — ABNORMAL HIGH (ref 70–99)
Glucose-Capillary: 188 mg/dL — ABNORMAL HIGH (ref 70–99)
Glucose-Capillary: 199 mg/dL — ABNORMAL HIGH (ref 70–99)

## 2019-05-19 LAB — POCT I-STAT 7, (LYTES, BLD GAS, ICA,H+H)
Acid-Base Excess: 3 mmol/L — ABNORMAL HIGH (ref 0.0–2.0)
Bicarbonate: 25.8 mmol/L (ref 20.0–28.0)
Calcium, Ion: 1.13 mmol/L — ABNORMAL LOW (ref 1.15–1.40)
HCT: 33 % — ABNORMAL LOW (ref 39.0–52.0)
Hemoglobin: 11.2 g/dL — ABNORMAL LOW (ref 13.0–17.0)
O2 Saturation: 93 %
Patient temperature: 36.8
Potassium: 3.7 mmol/L (ref 3.5–5.1)
Sodium: 140 mmol/L (ref 135–145)
TCO2: 27 mmol/L (ref 22–32)
pCO2 arterial: 32.2 mmHg (ref 32.0–48.0)
pH, Arterial: 7.511 — ABNORMAL HIGH (ref 7.350–7.450)
pO2, Arterial: 60 mmHg — ABNORMAL LOW (ref 83.0–108.0)

## 2019-05-19 LAB — CULTURE, BLOOD (ROUTINE X 2)

## 2019-05-19 LAB — COOXEMETRY PANEL
Carboxyhemoglobin: 1.3 % (ref 0.5–1.5)
Carboxyhemoglobin: 1 % (ref 0.5–1.5)
Carboxyhemoglobin: 1.5 % (ref 0.5–1.5)
Methemoglobin: 1.1 % (ref 0.0–1.5)
Methemoglobin: 0.7 % (ref 0.0–1.5)
Methemoglobin: 1.1 % (ref 0.0–1.5)
O2 Saturation: 53.2 %
O2 Saturation: 47.4 %
O2 Saturation: 64.2 %
Total hemoglobin: 11.3 g/dL — ABNORMAL LOW (ref 12.0–16.0)
Total hemoglobin: 11.1 g/dL — ABNORMAL LOW (ref 12.0–16.0)
Total hemoglobin: 13.5 g/dL (ref 12.0–16.0)

## 2019-05-19 LAB — HEPARIN LEVEL (UNFRACTIONATED): Heparin Unfractionated: 1.03 IU/mL — ABNORMAL HIGH (ref 0.30–0.70)

## 2019-05-19 LAB — HIV ANTIBODY (ROUTINE TESTING W REFLEX): HIV Screen 4th Generation wRfx: NONREACTIVE

## 2019-05-19 MED ORDER — SODIUM CHLORIDE 0.9 % IV SOLN
2.0000 g | INTRAVENOUS | Status: DC
  Administered 2019-05-19 – 2019-05-22 (×19): 2 g via INTRAVENOUS
  Filled 2019-05-19: qty 2000
  Filled 2019-05-19: qty 2
  Filled 2019-05-19: qty 2000
  Filled 2019-05-19 (×2): qty 2
  Filled 2019-05-19 (×2): qty 2000
  Filled 2019-05-19 (×4): qty 2
  Filled 2019-05-19 (×4): qty 2000
  Filled 2019-05-19 (×3): qty 2
  Filled 2019-05-19: qty 2000
  Filled 2019-05-19: qty 2
  Filled 2019-05-19: qty 2000
  Filled 2019-05-19: qty 2

## 2019-05-19 MED ORDER — SODIUM BICARBONATE-DEXTROSE 150-5 MEQ/L-% IV SOLN
150.0000 meq | INTRAVENOUS | Status: DC
  Administered 2019-05-19: 150 meq via INTRAVENOUS
  Filled 2019-05-19: qty 1000

## 2019-05-19 MED ORDER — FUROSEMIDE 10 MG/ML IJ SOLN
80.0000 mg | Freq: Once | INTRAMUSCULAR | Status: AC
  Administered 2019-05-19: 80 mg via INTRAVENOUS
  Filled 2019-05-19: qty 8

## 2019-05-19 MED ORDER — IOHEXOL 350 MG/ML SOLN
80.0000 mL | Freq: Once | INTRAVENOUS | Status: AC | PRN
  Administered 2019-05-19: 75 mL via INTRAVENOUS

## 2019-05-19 MED ORDER — POTASSIUM CHLORIDE CRYS ER 20 MEQ PO TBCR
40.0000 meq | EXTENDED_RELEASE_TABLET | Freq: Once | ORAL | Status: AC
  Administered 2019-05-19: 30 meq via ORAL
  Filled 2019-05-19: qty 2

## 2019-05-19 MED ORDER — SODIUM CHLORIDE 0.9 % IV SOLN
2.0000 g | Freq: Two times a day (BID) | INTRAVENOUS | Status: DC
  Administered 2019-05-19 – 2019-05-21 (×6): 2 g via INTRAVENOUS
  Filled 2019-05-19 (×2): qty 2
  Filled 2019-05-19 (×3): qty 20
  Filled 2019-05-19 (×3): qty 2

## 2019-05-19 MED ORDER — HEPARIN (PORCINE) 25000 UT/250ML-% IV SOLN: 1400.0000 [IU]/h | INTRAVENOUS | Status: DC

## 2019-05-19 MED ORDER — MILRINONE LACTATE IN DEXTROSE 20-5 MG/100ML-% IV SOLN
0.1250 ug/kg/min | INTRAVENOUS | Status: DC
  Administered 2019-05-19: 0.125 ug/kg/min via INTRAVENOUS
  Administered 2019-05-19 – 2019-05-22 (×5): 0.25 ug/kg/min via INTRAVENOUS
  Administered 2019-05-23: 0.125 ug/kg/min via INTRAVENOUS
  Filled 2019-05-19 (×7): qty 100

## 2019-05-19 MED ORDER — HEPARIN (PORCINE) 25000 UT/250ML-% IV SOLN
1700.0000 [IU]/h | INTRAVENOUS | Status: DC
  Administered 2019-05-19: 1700 [IU]/h via INTRAVENOUS

## 2019-05-19 NOTE — Progress Notes (Addendum)
Advanced Heart Failure Rounding Note   Subjective:    Swan placed on 9/12 with profound cardiogenic shock. Now on dobutamine 2.5  + NE 8 mcg+ bicarb gtt. CO-OX 53%   T Max 99.7. Bcx 2/2 enterococcus. UA +. Ucx pending   Head CT with likely acute (? Embolic) lesion   Back in NSR on IV amio.   Remains SOB at rest.  Very weak. No CP   Swan #s (done personally by Dr. Gala RomneyBensimhon) CVP 8 PA 52/23 (36) PCWP 23 Thermo CI 1.9 SVR 1149 PVR 3.0 WU  Objective:   Weight Range:  Vital Signs:   Temp:  [98.1 F (36.7 C)-99.7 F (37.6 C)] 98.2 F (36.8 C) (09/14 0700) Pulse Rate:  [80] 80 (09/14 0400) Resp:  [22-35] 27 (09/14 0700) BP: (87-122)/(58-94) 122/83 (09/14 0600) SpO2:  [88 %-100 %] 96 % (09/14 0700) Arterial Line BP: (104-130)/(51-73) 118/53 (09/14 0700) Weight:  [95.3 kg] 95.3 kg (09/14 0421) Last BM Date: October 23, 2018  Weight change: Filed Weights   05/17/19 0145 05/18/19 0500 05/19/19 0421  Weight: 92.9 kg 93.1 kg 95.3 kg    Intake/Output:   Intake/Output Summary (Last 24 hours) at 05/19/2019 0810 Last data filed at 05/19/2019 0700 Gross per 24 hour  Intake 4762.41 ml  Output 1440 ml  Net 3322.41 ml     Physical Exam: CVP 10  General:  Elderly. Dyspneic talking.  HEENT: normal Neck: supple. JVP 9-10 . Carotids 2+ bilat; no bruits. No lymphadenopathy or thryomegaly appreciated. RIJ swan  Cor: PMI nondisplaced. Irregular rate & rhythm. No rubs, gallops. 2/6 AS  Lungs: IW on HFNC 6 liters  Abdomen: soft, nontender, nondistended. No hepatosplenomegaly. No bruits or masses. Good bowel sounds. Extremities: no cyanosis, clubbing, rash, edema Neuro: alert & orientedx3, cranial nerves grossly intact. moves all 4 extremities w/o difficulty. Affect pleasant   Telemetry: NSR 80 with PACs  Personally reviewed   Labs: Basic Metabolic Panel: Recent Labs  Lab October 23, 2018 2018 October 23, 2018 2039  05/17/19 0802  05/18/19 0615 05/18/19 0641 05/18/19 2306 05/19/19 0430  05/19/19 0440  NA 132* 135   < > 136   < > 138 140 140 139 140  K 3.1* 3.0*   < > 3.3*   < > 3.4* 3.9 3.5 3.5 3.7  CL 102 103  --  106  --  108  --   --  104  --   CO2 16*  --   --  15*  --  17*  --   --  22  --   GLUCOSE 145* 143*  --  104*  --  196*  --   --  205*  --   BUN 58* 52*  --  63*  --  63*  --   --  52*  --   CREATININE 2.14* 2.00*  --  1.90*  --  1.33*  --   --  1.09  --   CALCIUM 9.0  --   --  8.8*  --  8.2*  --   --  7.8*  --   MG 2.4  --   --  2.3  --  2.5*  --   --   --   --    < > = values in this interval not displayed.    Liver Function Tests: Recent Labs  Lab October 23, 2018 2018 05/17/19 0802 05/18/19 0615 05/19/19 0430  AST 155* 156* 138* 95*  ALT 56* 65* 76* 69*  ALKPHOS 58 56  66 53  BILITOT 1.4* 1.6* 1.3* 1.3*  PROT 6.6 6.4* 6.0* 5.9*  ALBUMIN 3.1* 2.9* 2.5* 2.5*   No results for input(s): LIPASE, AMYLASE in the last 168 hours. No results for input(s): AMMONIA in the last 168 hours.  CBC: Recent Labs  Lab 05/22/2019 2018  05/17/19 0400 05/17/19 0802  05/18/19 0615 05/18/19 0641 05/18/19 2306 05/19/19 0430 05/19/19 0440  WBC 7.7  --   --  5.7  --  5.8  --   --  8.2  --   NEUTROABS 6.3  --   --   --   --   --   --   --   --   --   HGB 11.7*   < >  --  12.0*   < > 11.5* 11.2* 11.6* 11.3* 11.2*  HCT 34.8*   < >  --  34.4*   < > 33.0* 33.0* 34.0* 32.2* 33.0*  MCV 103.0*  --   --  99.7  --  98.8  --   --  99.7  --   PLT 67*  --  65* 62*  --  53*  --   --  56*  --    < > = values in this interval not displayed.    Cardiac Enzymes: No results for input(s): CKTOTAL, CKMB, CKMBINDEX, TROPONINI in the last 168 hours.  BNP: BNP (last 3 results) No results for input(s): BNP in the last 8760 hours.  ProBNP (last 3 results) No results for input(s): PROBNP in the last 8760 hours.    Other results:  Imaging: Ct Head Wo Contrast  Result Date: 05/18/2019 CLINICAL DATA:  Altered mental status of unknown cause. EXAM: CT HEAD WITHOUT CONTRAST TECHNIQUE:  Contiguous axial images were obtained from the base of the skull through the vertex without intravenous contrast. COMPARISON:  MRI 03/02/2014 FINDINGS: Brain: Age related volume loss. Chronic small-vessel ischemic changes of the hemispheric white matter. 3 cm region of low-density affecting the right frontoparietal junction cortical brain suggesting a recent infarction. No mass lesion, hemorrhage, hydrocephalus or extra-axial collection. Vascular: There is atherosclerotic calcification of the major vessels at the base of the brain. Skull: Negative Sinuses/Orbits: Clear/normal Other: None IMPRESSION: Acute or subacute infarction at the right frontoparietal junction. Region of involvement measures about 3 cm. No evidence of mass effect or hemorrhage. Chronic small-vessel ischemic changes of the white matter elsewhere. Electronically Signed   By: Paulina FusiMark  Shogry M.D.   On: 05/18/2019 09:05   [place] Abdomen Complete  Result Date: 05/17/2019 CLINICAL DATA:  Sepsis. EXAM: ABDOMEN ULTRASOUND COMPLETE COMPARISON:  CT scan 05/05/2019 FINDINGS: Gallbladder: Surgically absent. Common bile duct: Diameter: 3 mm Liver: Diffusely coarsened hepatic echotexture suggests fatty deposition. No focal abnormality. Portal vein is patent on color Doppler imaging with normal direction of blood flow towards the liver. IVC: No abnormality visualized. Pancreas: Obscured by bowel gas. Spleen: Size and appearance within normal limits. Right Kidney: Length: 11.6 cm. Echogenicity within normal limits. No mass or hydronephrosis visualized. Left Kidney: Length: 11.6 cm. Probable 6 mm stone in the interpolar region. Echogenicity within normal limits. No mass or hydronephrosis visualized. Abdominal aorta: No aneurysm visualized. Other findings: None. IMPRESSION: 1. Diffuse coarsening of hepatic echotexture suggests steatosis. 2. Otherwise unremarkable. Electronically Signed   By: Kennith CenterEric  Mansell M.D.   On: 05/17/2019 13:08   Dg Chest Port 1 View   Result Date: 05/17/2019 CLINICAL DATA:  Central line placement EXAM: PORTABLE CHEST 1 VIEW COMPARISON:  Chest radiograph 05/11/2019  FINDINGS: Monitoring leads overlie the patient. Interval insertion PA catheter with tip projecting over the mid aspect of the right lower lobe pulmonary artery. Stable cardiomegaly status post median sternotomy. Diffuse bilateral interstitial opacities. No definite pleural effusion or pneumothorax. IMPRESSION: PA catheter tip projects over the mid aspect of the right lower lobe pulmonary artery, consider repositioning as clinically indicated. Cardiomegaly with mild interstitial edema. These results will be called to the ordering clinician or representative by the Radiologist Assistant, and communication documented in the PACS or zVision Dashboard. Electronically Signed   By: Annia Belt M.D.   On: 05/17/2019 13:58     Medications:     Scheduled Medications: . chlorhexidine  15 mL Mouth Rinse BID  . Chlorhexidine Gluconate Cloth  6 each Topical Daily  . insulin aspart  0-9 Units Subcutaneous Q6H  . mouth rinse  15 mL Mouth Rinse BID  . mouth rinse  15 mL Mouth Rinse q12n4p  . pantoprazole (PROTONIX) IV  40 mg Intravenous Daily  . sodium chloride flush  10-40 mL Intracatheter Q12H    Infusions: . sodium chloride 10 mL/hr at 05/19/19 0700  . sodium chloride 10 mL/hr at 05/19/19 0700  . amiodarone 60 mg/hr (05/19/19 0700)  . ampicillin (OMNIPEN) IV Stopped (05/19/19 0630)  . heparin    . milrinone    . norepinephrine (LEVOPHED) Adult infusion 8 mcg/min (05/19/19 0700)  . vancomycin Stopped (05/19/19 0303)    PRN Medications: sodium chloride, acetaminophen, acetaminophen, influenza vaccine adjuvanted, sennosides, sodium chloride flush   Assessment/Plan:   1. Shock - Suspect combination of cardiogenic and septic shock - EF newly down 55-60% in 7/20 -> 25% here. Unclear etiology. Trop moderately up but flat. Initial suspicion was for viral myocarditis but  viral panel negative. - Initial swan numbers with low output and co-ox 44% - Bcx 2/2 enterococcus - Hemodynamics improved with inotrope support but co-ox still low at 53% - Echo suggests significantly increased gradients across VIV TAVR and MV - Continue abx and hemodynamic support.  - ID consult appreciated - TEE when respiratory status improves.   2. Acute systolic HF - EF 22% see discussion above - Continue inotrope support. NE 8 mcg + Dobutamine 2.5 mcg.  -CO-OX 53%. Index 1.7 . SVR trending up  BP better.  - Stop dobutamine. Start milrinone 0.125 mcg. Watch BP closely.   - CVP 10. Hold off on diuresis.    3. New onset AF with RVR  - continue amio & heparin.  - Back in NSR. EKG with SR 82 bpm  LBBB  -  Cut amio back to 30 mg per hour.  - run heparin low with low platelets - Personally reviewed  4. Enterococcus bacteremia - BLd CX - pending.  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  - Doubt he is surgical candidate - head CT with new infarct -> ? Septic emboli.  -Consider brain MRI +/- Neuro eval  5. CAD s/p CABG - trop up but doubt ACS as it is flat - will need coronary angio if creatinine permits  6. AKI - unclear baseline - improving with inotrope support - avoid nephrotoxic agents - Resolved. Renal function stable.   7. Acute hypoxic respiratory failure - improving with treatment of  shock   8. DM2 - SSI  7. Acute encephalopathy.  - likely metabolic - check ammonia   8 Aortic stenosis - s/p SAVR 2006 - s/p TAVR 2016  9. Hypokalemia - K 3.7. supp  10. Thrombocytopenia - suspect related to sepsis +/- valvular disease - PLTs stable at 56k  11. LBBB QRS 178 ms   Length of Stay: 2  Amy Clegg NP-C Advanced Heart Failure Team Pager [phone] (M-F; 7a  - 4p)  Please contact [facility] for night-coverage after hours (4p -7a ) and weekends on [email or web address]   Agree with above. He remains critically ill. Very weak and somnolent at times. Remains on dobutamine and NE. + bicarb drip. Converted to NSR on IV amio  Swan numbers obtained personally as above  General:  Weak appearing. Somnolent but arouses. + tachypneic HEENT: normal Neck: supple. JVP 8-9. Carotids 2+ bilat; no bruits. No lymphadenopathy or thryomegaly appreciated. Cor: PMI nondisplaced. Regular rate & rhythm. 2/6 AS Lungs: coarse tachypneic Abdomen: soft, nontender, nondistended. No hepatosplenomegaly. No bruits or masses. Good bowel sounds. Extremities: no cyanosis, clubbing, rash, edema cool Neuro: somnolent but orientedx3, cranial nerves grossly intact. moves all 4 extremities w/o difficulty.   He remains critically ill with cardiogenic shock. Will switch dobutamine to milrinone carefull. Continue NE. Hold lasix for now. Repeat CXR. Appreciate ID input. Continue broad spectrum abx. Will ask Neuro to see.  Will need TEE at bedside with Anesthesia when resp status more stable. ? Tomorrow.   CRITICAL CARE Performed by: Glori Bickers  Total critical care time: 35 minutes  Critical care time was exclusive of separately billable procedures and treating other patients.  Critical care was necessary to treat or prevent imminent or life-threatening deterioration.  Critical care was time spent personally by me (independent of midlevel providers or residents) on the following activities: development of treatment plan with patient and/or surrogate as well as nursing, discussions with consultants, evaluation of patient's response to treatment, examination of patient, obtaining history from patient or surrogate, ordering and performing treatments and interventions, ordering and review of laboratory studies, ordering and review of radiographic studies, pulse oximetry and re-evaluation of  patient's condition.  Glori Bickers, MD  8:43 AM

## 2019-05-19 NOTE — Progress Notes (Signed)
  Evaluation after Contrast Extravasation  Patient seen and examined immediately after contrast extravasation while in CT1. Per CT tech, approximately 100 cc contrast and 40 cc saline was administered via LUE peripheral IV, but "not sure how much went in".  Exam: There is mild swelling of the LUE at IV site. Swelling was marked with skin marker by RN. There is no erythema. There is no discoloration. There are no blisters. Of note, there is skin breakdown at left medial epicondyle that RN states was present prior to CT scan. There are no signs of decreased perfusion of the skin.  It is warm to touch.  The patient has normal ROM in fingers bilaterally.  Radial pulse 2+ bilaterally.  Per contrast extravasation protocol, I have instructed the patient to keep an ice pack on the area for 20-60 minutes at a time for about 48 hours.   Keep arm elevated as much as possible.   The patient understands to call the radiology department if there is: - increase in pain or swelling - changed or altered sensation - ulceration or blistering - increasing redness - warmth or increasing firmness - decreased tissue perfusion as noted by decreased capillary refill or discoloration of skin - decreased pulses peripheral to site  IR to follow.   Earley Abide PA-C 05/19/2019 3:57 PM

## 2019-05-19 NOTE — Progress Notes (Signed)
ANTICOAGULATION CONSULT NOTE - Follow Up Consult  Pharmacy Consult for Heparin Indication: atrial fibrillation  Allergies  Allergen Reactions  . Sulfamethoxazole Rash    [facility]    Patient Measurements: Height: 5\' 11"  (180.3 cm) Weight: 210 lb 1.6 oz (95.3 kg) IBW/kg (Calculated) : 75.3  Vital Signs: Temp: 98.1 F (36.7 C) (09/14 0518) Temp Source: Core (09/14 0400) BP: 120/61 (09/14 0500) Pulse Rate: 80 (09/14 0400)  Labs: Recent Labs    09-29-2018 2018  09-29-2018 2310  05/17/19 0400 05/17/19 0802  05/18/19 0615 05/18/19 [id number]  05/18/19 0817 05/18/19 1803 05/18/19 2306 05/19/19 0430 05/19/19 0440  HGB 11.7*   < >  --    < >  --  12.0*   < > 11.5*  --    < >  --   --  11.6* 11.3* 11.2*  HCT 34.8*   < >  --    < >  --  34.4*   < > 33.0*  --    < >  --   --  34.0* 32.2* 33.0*  PLT 67*  --   --   --  65* 62*  --  53*  --   --   --   --   --  56*  --   APTT  --   --   --   --  32  --   --   --  41*  --   --   --   --   --   --   LABPROT 19.2*  --   --   --  17.8*  --   --   --  18.4*  --   --   --   --   --   --   INR 1.6*  --   --   --  1.5*  --   --   --  1.6*  --   --   --   --   --   --   HEPARINUNFRC  --   --   --   --   --   --    < >  --   --   --  <0.10* 0.62  --  1.03*  --   CREATININE 2.14*   < >  --   --   --  1.90*  --  1.33*  --   --   --   --   --  1.09  --   TROPONINIHS 14,474*  --  81,19113,617*  --   --   --   --   --   --   --   --   --   --   --   --    < > = values in this interval not displayed.    Estimated Creatinine Clearance: 69 mL/min (by C-G formula based on SCr of 1.09 mg/dL).   Medical History: Past Medical History:  Diagnosis Date  . Aortic stenosis    Aortic valve replacement 2006  . ASD secundum    Closure, October, 2006  . Atrial fibrillation (HCC)    Postoperative, previously treated with amiodarone and Coumadin, then stop  . Bilateral carotid artery disease (HCC)   . CAD, multiple vessel    Stress  echo, January , 2012, no ischemia  . Carotid artery disease (HCC)    Bilateral, followed closely by Dr. Imogene Burnhen ([facility]), February, 2012  . Cerebrovascular disease 12/04/2014  .  Chest wall pain    October, 2012  . Chronic back pain    And sternal  . Diabetes mellitus type II   . Ejection fraction    EF, . 60-65%,echo, January, 2012  . 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 Hx of CABG    2006, aortic valve replacement, ASD closure  . Hyperlipidemia    Abnormal LFTs in the past, Lipitor discontinued, followed by Dr. Nadara Mustard he  . Hypertension   . Mitral regurgitation    Echo, January, 2012  . Nephrolithiasis   . Numbness and tingling in hands    October, 2012  . S/P aortic valve replacement    2006, bovine pericardial tissue valve /   , Normal function, echo, January, 2012    Medications:  Medications Prior to Admission  Medication Sig Dispense Refill Last Dose  . acetaminophen (TYLENOL) 500 MG tablet Take 1,000 mg by mouth every 6 (six) hours as needed for headache (pain).   05/11/2019 at am  . amLODipine (NORVASC) 5 MG tablet Take 5 mg by mouth daily.   05/22/2019 at am  . aspirin EC 81 MG tablet Take 81 mg by mouth at bedtime.   unknown  . atorvastatin (LIPITOR) 10 MG tablet Take 10 mg by mouth at bedtime.    unknown  . gemfibrozil (LOPID) 600 MG tablet Take 600 mg by mouth 2 (two) times daily.     05/13/2019 at am  . glipiZIDE (GLUCOTROL XL) 10 MG 24 hr tablet Take 10 mg by mouth daily with breakfast.   06/01/2019 at am  . Magnesium 250 MG TABS Take 250 mg by mouth at bedtime.    unknown  . Multiple Vitamin (MULTIVITAMIN WITH MINERALS) TABS tablet Take 1 tablet by mouth daily.   05/30/2019 at am  . OVER THE COUNTER MEDICATION Take 1 tablet by mouth at bedtime. Zinc 30 mg/ copper 2 mg   unknown    Assessment: 77 y.o. male with SOB/CHF/Afib on heparin   9/14 AM update: -Heparin level elevated -No issues with lab draw -Some mild blood-tinged sputum overnight, not surprising with plts of 56, Hg is  stable though, continue to monitor this   Goal of Therapy:  Heparin level 0.3-0.7 units/ml Monitor platelets by anticoagulation protocol: Yes   Plan:  Hold heparin x 30 mins Re-start heparin at 1700 units/hr Re-check heparin level at [place], PharmD, [place] Pharmacist Phone: [phone]

## 2019-05-19 NOTE — Progress Notes (Signed)
ANTICOAGULATION CONSULT NOTE - Follow Up Consult  Pharmacy Consult for Heparin Indication: atrial fibrillation  Allergies  Allergen Reactions  . Sulfamethoxazole Rash    [facility]    Patient Measurements: Height: 5\' 11"  (180.3 cm) Weight: 210 lb 1.6 oz (95.3 kg) IBW/kg (Calculated) : 75.3  Vital Signs: Temp: 98.2 F (36.8 C) (09/14 0700) Temp Source: Core (09/14 0400) BP: 122/83 (09/14 0600) Pulse Rate: 80 (09/14 0400)  Labs: Recent Labs    02/23/19 2018  02/23/19 2310  05/17/19 0400 05/17/19 0802  05/18/19 0615 05/18/19 [id number]  05/18/19 0817 05/18/19 1803 05/18/19 2306 05/19/19 0430 05/19/19 0440  HGB 11.7*   < >  --    < >  --  12.0*   < > 11.5*  --    < >  --   --  11.6* 11.3* 11.2*  HCT 34.8*   < >  --    < >  --  34.4*   < > 33.0*  --    < >  --   --  34.0* 32.2* 33.0*  PLT 67*  --   --   --  65* 62*  --  53*  --   --   --   --   --  56*  --   APTT  --   --   --   --  32  --   --   --  41*  --   --   --   --   --   --   LABPROT 19.2*  --   --   --  17.8*  --   --   --  18.4*  --   --   --   --   --   --   INR 1.6*  --   --   --  1.5*  --   --   --  1.6*  --   --   --   --   --   --   HEPARINUNFRC  --   --   --   --   --   --    < >  --   --   --  <0.10* 0.62  --  1.03*  --   CREATININE 2.14*   < >  --   --   --  1.90*  --  1.33*  --   --   --   --   --  1.09  --   TROPONINIHS 14,474*  --  96,04513,617*  --   --   --   --   --   --   --   --   --   --   --   --    < > = values in this interval not displayed.    Estimated Creatinine Clearance: 69 mL/min (by C-G formula based on SCr of 1.09 mg/dL).   Medical History: Past Medical History:  Diagnosis Date  . Aortic stenosis    Aortic valve replacement 2006  . ASD secundum    Closure, October, 2006  . Atrial fibrillation (HCC)    Postoperative, previously treated with amiodarone and Coumadin, then stop  . Bilateral carotid artery disease (HCC)   . CAD, multiple vessel    Stress  echo, January , 2012, no ischemia  . Carotid artery disease (HCC)    Bilateral, followed closely by Dr. Imogene Burnhen ([facility]), February, 2012  . Cerebrovascular disease 12/04/2014  .  Chest wall pain    October, 2012  . Chronic back pain    And sternal  . Diabetes mellitus type II   . Ejection fraction    EF, . 60-65%,echo, January, 2012  . 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 Hx of CABG    2006, aortic valve replacement, ASD closure  . Hyperlipidemia    Abnormal LFTs in the past, Lipitor discontinued, followed by Dr. Nadara Mustard he  . Hypertension   . Mitral regurgitation    Echo, January, 2012  . Nephrolithiasis   . Numbness and tingling in hands    October, 2012  . S/P aortic valve replacement    2006, bovine pericardial tissue valve /   , Normal function, echo, January, 2012    Medications:  Medications Prior to Admission  Medication Sig Dispense Refill Last Dose  . acetaminophen (TYLENOL) 500 MG tablet Take 1,000 mg by mouth every 6 (six) hours as needed for headache (pain).   05/28/19 at am  . amLODipine (NORVASC) 5 MG tablet Take 5 mg by mouth daily.   May 28, 2019 at am  . aspirin EC 81 MG tablet Take 81 mg by mouth at bedtime.   unknown  . atorvastatin (LIPITOR) 10 MG tablet Take 10 mg by mouth at bedtime.    unknown  . gemfibrozil (LOPID) 600 MG tablet Take 600 mg by mouth 2 (two) times daily.     05/28/2019 at am  . glipiZIDE (GLUCOTROL XL) 10 MG 24 hr tablet Take 10 mg by mouth daily with breakfast.   05-28-2019 at am  . Magnesium 250 MG TABS Take 250 mg by mouth at bedtime.    unknown  . Multiple Vitamin (MULTIVITAMIN WITH MINERALS) TABS tablet Take 1 tablet by mouth daily.   May 28, 2019 at am  . OVER THE COUNTER MEDICATION Take 1 tablet by mouth at bedtime. Zinc 30 mg/ copper 2 mg   unknown    Assessment: 78 y.o. male with SOB/CHF/new Afib for heparin no anticoagulation PTA  Heparin drip continued to be increased d/t HL continued to be undetectable  -concerned that patient is requiring increased  drip rate 19uts/kg/hr without response RN and I checked IV line - heparin infusing properly in LUE PIV no infiltration noted.  HL drawn from CL.  Decided to change   IV infusion site anyway  Heparin drip 1900 uts/hr HL this am 1.03.  Will hold heparin x1 hour and restart at lower rate.   Clearly new IV site working better than old.  H/h stable, no bleeding noted   - pltc 50s in setting of sepsis   - discussed with MD - continue heparin  Goal of Therapy:  Heparin level 0.3-0.7 units/ml Monitor platelets by anticoagulation protocol: Yes   Plan:  Hold heparin drip x1 hr  Decrease heparin drip 1400 uts/hr  Check heparin level in 6hr from restart Daily HL, CBC  Bonnita Nasuti Pharm.D. CPP, BCPS Clinical Pharmacist [phone] 05/19/2019 8:45 AM

## 2019-05-19 NOTE — Progress Notes (Signed)
[facility] Progress Note Patient Name: Justin Valencia DOB: 1940/10/17 MRN: [id number]   Date of Service  05/19/2019  HPI/Events of Note  ABG on Oxly O2 = 7.497/28.3/60.0/21.5.   eICU Interventions  Will order: 1. Decrease NaHCO3 IV infusion to 25 mL/hour.  2. Repeat ABG at 5 AM.      Intervention Category Major Interventions: Acid-Base disturbance - evaluation and management  Lorae Roig [place] 05/19/2019, 12:03 AM

## 2019-05-19 NOTE — Plan of Care (Signed)
  Problem: Clinical Measurements: Goal: Ability to maintain clinical measurements within normal limits will improve Outcome: Progressing Goal: Diagnostic test results will improve Outcome: Progressing   Problem: Coping: Goal: Level of anxiety will decrease Outcome: Progressing   Problem: Pain Managment: Goal: General experience of comfort will improve Outcome: Progressing

## 2019-05-19 NOTE — Progress Notes (Signed)
PHARMACY NOTE:  ANTIMICROBIAL RENAL DOSAGE ADJUSTMENT  Current antimicrobial regimen includes a mismatch between antimicrobial dosage and estimated renal function.  As per policy approved by the Pharmacy & Therapeutics and Medical Executive Committees, the antimicrobial dosage will be adjusted accordingly.  Current antimicrobial dosage:  Ampicillin 2g q6h  Indication: Enterococcal bacteremia  Renal Function:  Estimated Creatinine Clearance: 69 mL/min (by C-G formula based on SCr of 1.09 mg/dL).     Antimicrobial dosage has been changed to:  Ampicillin 2g q4h   Thank you for allowing pharmacy to be a part of this patient's care.  Phillis Haggis, [facility] 05/19/2019 8:49 AM

## 2019-05-19 NOTE — Progress Notes (Addendum)
NAME:  Justin Valencia, MRN:  [id number], DOB:  09/12/1940, LOS: 0 ADMISSION DATE:  2019-05-24, CONSULTATION DATE:  2019/05/24 REFERRING MD:  Marlou Porch - [facility], CHIEF COMPLAINT:  Hypotension   HPI/course in hospital  78 year old man with several days of progressive weakness and a fall at home. He is somnolent and cannot provide any clear history of concurrent symptomatology. There is no history of chest pain. No abdominal pain or dyspnea.  He was found to be dyspneic and hypotensive. New evidence of decreased EF with elevated troponin, but warm extremities and thrombocytopenia not consistent with pure cardiogenic shock.  Past Medical History   Past Medical History:  Diagnosis Date  . Aortic stenosis    Aortic valve replacement 2006  . ASD secundum    Closure, October, 2006  . Atrial fibrillation (HCC)    Postoperative, previously treated with amiodarone and Coumadin, then stop  . Bilateral carotid artery disease ([place])   . CAD, multiple vessel    Stress echo, January , 2012, no ischemia  . Carotid artery disease (HCC)    Bilateral, followed closely by Dr. Bridgett Larsson ([facility]), February, 2012  . Cerebrovascular disease 12/04/2014  . Chest wall pain    October, 2012  . Chronic back pain    And sternal  . Diabetes mellitus type II   . Ejection fraction    EF, . 60-65%,echo, January, 2012  . 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 Hx of CABG    2006, aortic valve replacement, ASD closure  . Hyperlipidemia    Abnormal LFTs in the past, Lipitor discontinued, followed by Dr. Nadara Mustard he  . Hypertension   . Mitral regurgitation    Echo, January, 2012  . Nephrolithiasis   . Numbness and tingling in hands    October, 2012  . S/P aortic valve replacement    2006, bovine pericardial tissue valve /   , Normal function, echo, January, 2012       Interim history/subjective:  PA cath placed 9/12 revealing profound cardiogenic shock per HF team. Blood cx 2/2 enterococcus. ID consulted. AF RVR. On 6lpm  HFNC.   Objective   Blood pressure (!) 107/52, pulse (!) 46, temperature 98 F (36.7 C), temperature source Oral, resp. rate (!) 32, height 5\' 11"  (1.803 m), weight 92.9 kg, SpO2 95 %.        Intake/Output Summary (Last 24 hours) at 05/17/2019 1025 Last data filed at 05/17/2019 1000 Gross per 24 hour  Intake 1024.41 ml  Output 1025 ml  Net -0.59 ml   Filed Weights   May 24, 2019 2004 05/17/19 0145  Weight: 91.6 kg 92.9 kg    Examination: General: Elderly, slightly dyspneic while talking, smiling, interactive HENT: Normocephalic, PERRL. Moist mucus membranes Neck: Slight JVD. Trachea midline.  CV: IRRR. S1S2. 2/69M, RG. +2 distal pulses Lungs: BBS present, diminished bases, FNL, symmetrical ABD: +BS x4. SNT/ND. No masses, guarding or rigidity GU: Foley EXT: MAE well. No edema Skin: PWD. In tact. No rashes or lesions Neuro: A&Ox3. CN II-XII in tact. No focal deficits.  Per nursing has had intermittent confusion      Ancillary tests (personally reviewed)    Assessment & Plan:   Critically ill due inotrope dependent cardiogenic shock  Bacteremia New onset A. fib with RVR  Acute hypoxic respiratory failure secondary to above AKI-improved   Recommendations. Cardiogenic shock management, A. fib management per cardiology and heart failure team. ID has been consulted and following for bacteremia. AKI improving with treatment of  above Patient does have increasing FiO2 needs.  Hopefully this will improve with continued treatment of above. If patient declines requires further please reconsult PCCM.  PCCM will sign off. Thank you for the opportunity to participate in this patient's care. Please contact if we can be of further assistance.     Independently examined pt, evaluated data & formulated above care plan with NP  New onset acute systolic heart failure with cardiogenic shock-dobutamine has been transitioned to milrinone which is being titrated to co-oximetry, remains  on levo fed Acute respiratory failure with hypoxia-chest x-ray personally reviewed which shows increased bilateral airspace disease, Lasix on hold today, has switched to high flow nasal cannula 6 L , would agree with holding off TEE until respiratory status improves  PCCM will be available as needed, advanced heart failure seems to be managing  Koi Yarbro V. Vassie LollAlva MD

## 2019-05-19 NOTE — Progress Notes (Addendum)
Advanced Heart Failure Rounding Note   Subjective:    Swan placed on 9/12 with profound cardiogenic shock. Now on dobutamine 2.5  + NE 8 mcg+ bicarb gtt. CO-OX 53%    T Max 99.7. Bcx 2/2 enterococcus. UA +. Ucx pending   Head CT with likely acute lesion   Remains in AF on IV amio.  Rate better controlled.   Remains SOB at rest.    Swan #s RA 13 PAP 68/30 PCWP 32 Thermo CO/CI  3.8/1.8  SVR 1444   Objective:   Weight Range:  Vital Signs:   Temp:  [98.1 F (36.7 C)-99.7 F (37.6 C)] 98.2 F (36.8 C) (09/14 0700) Pulse Rate:  [80] 80 (09/14 0400) Resp:  [22-35] 27 (09/14 0700) BP: (87-122)/(58-94) 122/83 (09/14 0600) SpO2:  [88 %-100 %] 96 % (09/14 0700) Arterial Line BP: (104-130)/(51-73) 118/53 (09/14 0700) Weight:  [95.3 kg] 95.3 kg (09/14 0421) Last BM Date: 11/13/18  Weight change: Filed Weights   05/17/19 0145 05/18/19 0500 05/19/19 0421  Weight: 92.9 kg 93.1 kg 95.3 kg    Intake/Output:   Intake/Output Summary (Last 24 hours) at 05/19/2019 0710 Last data filed at 05/19/2019 0700 Gross per 24 hour  Intake 5294.15 ml  Output 1440 ml  Net 3854.15 ml     Physical Exam: CVP 10  General:  Elderly. Dyspneic talking.  HEENT: normal Neck: supple. JVP 9-10 . Carotids 2+ bilat; no bruits. No lymphadenopathy or thryomegaly appreciated. RIJ swan  Cor: PMI nondisplaced. Irregular rate & rhythm. No rubs, gallops. 2/6 AS  Lungs: IW on HFNC 6 liters  Abdomen: soft, nontender, nondistended. No hepatosplenomegaly. No bruits or masses. Good bowel sounds. Extremities: no cyanosis, clubbing, rash, edema Neuro: alert & orientedx3, cranial nerves grossly intact. moves all 4 extremities w/o difficulty. Affect pleasant   Telemetry: NSR with PACs   EKG: SR PVCs 82 bpm LBBB 178   Labs: Basic Metabolic Panel: Recent Labs  Lab 11/13/18 2018 11/13/18 2039  05/17/19 0802  05/18/19 0615 05/18/19 0641 05/18/19 2306 05/19/19 0430 05/19/19 0440  NA 132* 135   < >  136   < > 138 140 140 139 140  K 3.1* 3.0*   < > 3.3*   < > 3.4* 3.9 3.5 3.5 3.7  CL 102 103  --  106  --  108  --   --  104  --   CO2 16*  --   --  15*  --  17*  --   --  22  --   GLUCOSE 145* 143*  --  104*  --  196*  --   --  205*  --   BUN 58* 52*  --  63*  --  63*  --   --  52*  --   CREATININE 2.14* 2.00*  --  1.90*  --  1.33*  --   --  1.09  --   CALCIUM 9.0  --   --  8.8*  --  8.2*  --   --  7.8*  --   MG 2.4  --   --  2.3  --  2.5*  --   --   --   --    < > = values in this interval not displayed.    Liver Function Tests: Recent Labs  Lab 11/13/18 2018 05/17/19 0802 05/18/19 0615 05/19/19 0430  AST 155* 156* 138* 95*  ALT 56* 65* 76* 69*  ALKPHOS 58 56 66 53  BILITOT 1.4* 1.6* 1.3* 1.3*  PROT 6.6 6.4* 6.0* 5.9*  ALBUMIN 3.1* 2.9* 2.5* 2.5*   No results for input(s): LIPASE, AMYLASE in the last 168 hours. No results for input(s): AMMONIA in the last 168 hours.  CBC: Recent Labs  Lab 05/12/2019 2018  05/17/19 0400 05/17/19 0802  05/18/19 0615 05/18/19 0641 05/18/19 2306 05/19/19 0430 05/19/19 0440  WBC 7.7  --   --  5.7  --  5.8  --   --  8.2  --   NEUTROABS 6.3  --   --   --   --   --   --   --   --   --   HGB 11.7*   < >  --  12.0*   < > 11.5* 11.2* 11.6* 11.3* 11.2*  HCT 34.8*   < >  --  34.4*   < > 33.0* 33.0* 34.0* 32.2* 33.0*  MCV 103.0*  --   --  99.7  --  98.8  --   --  99.7  --   PLT 67*  --  65* 62*  --  53*  --   --  56*  --    < > = values in this interval not displayed.    Cardiac Enzymes: No results for input(s): CKTOTAL, CKMB, CKMBINDEX, TROPONINI in the last 168 hours.  BNP: BNP (last 3 results) No results for input(s): BNP in the last 8760 hours.  ProBNP (last 3 results) No results for input(s): PROBNP in the last 8760 hours.    Other results:  Imaging: Ct Head Wo Contrast  Result Date: 05/18/2019 CLINICAL DATA:  Altered mental status of unknown cause. EXAM: CT HEAD WITHOUT CONTRAST TECHNIQUE: Contiguous axial images were  obtained from the base of the skull through the vertex without intravenous contrast. COMPARISON:  MRI 03/02/2014 FINDINGS: Brain: Age related volume loss. Chronic small-vessel ischemic changes of the hemispheric white matter. 3 cm region of low-density affecting the right frontoparietal junction cortical brain suggesting a recent infarction. No mass lesion, hemorrhage, hydrocephalus or extra-axial collection. Vascular: There is atherosclerotic calcification of the major vessels at the base of the brain. Skull: Negative Sinuses/Orbits: Clear/normal Other: None IMPRESSION: Acute or subacute infarction at the right frontoparietal junction. Region of involvement measures about 3 cm. No evidence of mass effect or hemorrhage. Chronic small-vessel ischemic changes of the white matter elsewhere. Electronically Signed   By: Paulina Fusi M.D.   On: 05/18/2019 09:05   US Abdomen Complete  Result Date: 05/17/2019 CLINICAL DATA:  Sepsis. EXAM: ABDOMEN ULTRASOUND COMPLETE COMPARISON:  CT scan 05/05/2019 FINDINGS: Gallbladder: Surgically absent. Common bile duct: Diameter: 3 mm Liver: Diffusely coarsened hepatic echotexture suggests fatty deposition. No focal abnormality. Portal vein is patent on color Doppler imaging with normal direction of blood flow towards the liver. IVC: No abnormality visualized. Pancreas: Obscured by bowel gas. Spleen: Size and appearance within normal limits. Right Kidney: Length: 11.6 cm. Echogenicity within normal limits. No mass or hydronephrosis visualized. Left Kidney: Length: 11.6 cm. Probable 6 mm stone in the interpolar region. Echogenicity within normal limits. No mass or hydronephrosis visualized. Abdominal aorta: No aneurysm visualized. Other findings: None. IMPRESSION: 1. Diffuse coarsening of hepatic echotexture suggests steatosis. 2. Otherwise unremarkable. Electronically Signed   By: Kennith Center M.D.   On: 05/17/2019 13:08   Dg Chest Port 1 View  Result Date: 05/17/2019 CLINICAL  DATA:  Central line placement EXAM: PORTABLE CHEST 1 VIEW COMPARISON:  Chest radiograph 05/08/2019 FINDINGS: Monitoring leads  overlie the patient. Interval insertion PA catheter with tip projecting over the mid aspect of the right lower lobe pulmonary artery. Stable cardiomegaly status post median sternotomy. Diffuse bilateral interstitial opacities. No definite pleural effusion or pneumothorax. IMPRESSION: PA catheter tip projects over the mid aspect of the right lower lobe pulmonary artery, consider repositioning as clinically indicated. Cardiomegaly with mild interstitial edema. These results will be called to the ordering clinician or representative by the Radiologist Assistant, and communication documented in the PACS or zVision Dashboard. Electronically Signed   By: Lovey Newcomer M.D.   On: 05/17/2019 13:58     Medications:     Scheduled Medications: . chlorhexidine  15 mL Mouth Rinse BID  . Chlorhexidine Gluconate Cloth  6 each Topical Daily  . insulin aspart  0-9 Units Subcutaneous Q6H  . mouth rinse  15 mL Mouth Rinse BID  . mouth rinse  15 mL Mouth Rinse q12n4p  . pantoprazole (PROTONIX) IV  40 mg Intravenous Daily  . sodium chloride flush  10-40 mL Intracatheter Q12H    Infusions: . sodium chloride 10 mL/hr at 05/19/19 0700  . sodium chloride 10 mL/hr at 05/19/19 0700  . amiodarone 60 mg/hr (05/19/19 0700)  . ampicillin (OMNIPEN) IV Stopped (05/19/19 0630)  . DOBUTamine 2.5 mcg/kg/min (05/19/19 0700)  . heparin 1,700 Units/hr (05/19/19 0700)  . norepinephrine (LEVOPHED) Adult infusion 8 mcg/min (05/19/19 0700)  . sodium bicarbonate 150 mEq in dextrose 5% 1000 mL 25 mL/hr at 05/19/19 0700  . vancomycin Stopped (05/19/19 0303)    PRN Medications: sodium chloride, acetaminophen, acetaminophen, influenza vaccine adjuvanted, sennosides, sodium chloride flush   Assessment/Plan:   1. Shock - Suspect combination of cardiogenic and septic shock - EF newly down 55-60% in 7/20 ->  25% here. Unclear etiology. Trop moderately up but flat. Initial suspicion was for viral myocarditis but viral panel negative. - Initial swan numbers with low output and co-ox 44% - Bcx 2/2 enterococcus - Hemodynamics improved with inotrope support - Echo suggests significantly increased gradients across VIV TAVR and MV - Continue abx and hemodynamic support.  - ID consulted - TEE when respiratory status improves.   2. Acute systolic HF - EF 43% see discussion above - Continue inotrope support. NE 8 mcg + Dobutamine 2.5 mcg.  -CO-OX 53%. Index 1.7 . SVR trending up  BP better.  - Stop dobutamine. Start milrinone 0.125 mcg. Watch BP closely.   - CVP 10. Hold off on diuresis.    3. New onset AF with RVR  - continue amio & heparin.  - Back in NSR. EKG with SR 82 bpm  LBBB  -  Cut amio back to 30 mg per hour.  - run heparin low with low platelets - Personally reviewed  4. Enterococcus bacteremia - BLd CX - pending.  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  - Doubt he is surgical candidate - head CT with new infarct -> ? Septic emboli.  -Consider brain MRI +/- Neuro eval  5. CAD s/p CABG - trop up but doubt ACS as it is flat - will need coronary angio if creatinine permits  6. AKI - unclear baseline - improving with inotrope support - avoid nephrotoxic agents - Resolved. Renal function stable.   7. Acute  hypoxic respiratory failure - improving with treatment of shock   8. DM2 - SSI  7. Acute encephalopathy.  - likely metabolic - check ammonia   8 Aortic stenosis - s/p SAVR 2006 - s/p TAVR 2016  9. Hypokalemia - supp  10. Thrombocytopenia - suspect related to sepsis +/- valvular disease  11. LBBB QRS 178 ms    Length of Stay: 2   Amy Clegg NP-C  05/19/2019,  7:10 AM  Advanced Heart Failure Team Pager [phone] (M-F; 7a - 4p)  Please contact [facility] for night-coverage after hours (4p -7a ) and weekends on [email or web address]

## 2019-05-19 NOTE — Progress Notes (Signed)
[facility]   Reason for visit: Follow up on Enterococcal sepsis  Antibiotics: Vancomycin, day 3 ampicillin, day 2  Interval History: remains on pressors (dobutamine stopped and replaced with milrinone). He remains on levophed gtt at 8 mcg/min). NaHCO3 gtt has now been stopped. TEE planned for tomorrow. Pt remains extremely lethargic with significant dyspnea. Initial blood cxs confirmed as pan-sensitive Enterococcus faecalis. The patient's wife is at bedside. He struggled swallowing pills this AM, so diet has been unable to be instituted. Fever curve, WBC & Cr trends, imaging, cx results, and ABX usage all independently reviewed    Current Facility-Administered Medications:    0.9 %  sodium chloride infusion, 250 mL, Intravenous, Continuous, Ralene OkUgowe, Francis, MD, Stopped at 05/19/19 1103   0.9 %  sodium chloride infusion, , Intravenous, PRN, Bensimhon, Bevelyn Bucklesaniel R, MD, Last Rate: 10 mL/hr at 05/19/19 1110   acetaminophen (TYLENOL) suppository 650 mg, 650 mg, Rectal, Q4H PRN, Jake BatheSkains, Mark C, MD, 650 mg at 05/17/19 1700   acetaminophen (TYLENOL) tablet 650 mg, 650 mg, Oral, Q4H PRN, Ralene OkUgowe, Francis, MD   [COMPLETED] amiodarone (NEXTERONE) 1.8 mg/mL load via infusion 150 mg, 150 mg, Intravenous, Once, 150 mg at 05/17/19 1045 **FOLLOWED BY** [EXPIRED] amiodarone (NEXTERONE PREMIX) 360-4.14 MG/200ML-% (1.8 mg/mL) IV infusion, 60 mg/hr, Intravenous, Continuous, Stopped at 05/17/19 1616 **FOLLOWED BY** amiodarone (NEXTERONE PREMIX) 360-4.14 MG/200ML-% (1.8 mg/mL) IV infusion, 30 mg/hr, Intravenous, Continuous, Bensimhon, Bevelyn Bucklesaniel R, MD, Last Rate: 16.67 mL/hr at 05/19/19 1110, 30 mg/hr at 05/19/19 1110   ampicillin (OMNIPEN) 2 g in sodium chloride 0.9 % 100 mL IVPB, 2 g, Intravenous, Q4H, Skains, Veverly FellsMark C, MD, Stopped at 05/19/19 0951   chlorhexidine (PERIDEX) 0.12 % solution 15 mL, 15 mL, Mouth Rinse, BID, Jake BatheSkains, Mark C, MD, 15 mL at 05/19/19 0933   Chlorhexidine  Gluconate Cloth 2 % PADS 6 each, 6 each, Topical, Daily, Skains, Mark C, MD, 6 each at 05/18/19 2200   heparin ADULT infusion 100 units/mL (25000 units/21950mL sodium chloride 0.45%), 1,400 Units/hr, Intravenous, Continuous, Bensimhon, Bevelyn Bucklesaniel R, MD, Last Rate: 14 mL/hr at 05/19/19 1110, 1,400 Units/hr at 05/19/19 1110   influenza vaccine adjuvanted (FLUAD) injection 0.5 mL, 0.5 mL, Intramuscular, Prior to discharge, Jake BatheSkains, Mark C, MD   insulin aspart (novoLOG) injection 0-9 Units, 0-9 Units, Subcutaneous, Q6H, Ralene OkUgowe, Francis, MD, 2 Units at 05/19/19 [id number]   MEDLINE mouth rinse, 15 mL, Mouth Rinse, BID, Jake BatheSkains, Mark C, MD, 15 mL at 05/19/19 0002   MEDLINE mouth rinse, 15 mL, Mouth Rinse, q12n4p, Skains, Mark C, MD, 15 mL at 05/18/19 1600   milrinone (PRIMACOR) 20 MG/100 ML (0.2 mg/mL) infusion, 0.125 mcg/kg/min, Intravenous, Continuous, Bensimhon, Bevelyn Bucklesaniel R, MD, Last Rate: 3.57 mL/hr at 05/19/19 1000, 0.125 mcg/kg/min at 05/19/19 1000   norepinephrine (LEVOPHED) 16 mg in 250mL premix infusion, 8 mcg/min, Intravenous, Titrated, Bensimhon, Bevelyn Bucklesaniel R, MD, Last Rate: 7.5 mL/hr at 05/19/19 1110, 8 mcg/min at 05/19/19 1110   pantoprazole (PROTONIX) injection 40 mg, 40 mg, Intravenous, Daily, Ralene OkUgowe, Francis, MD, 40 mg at 05/19/19 0926   sennosides (SENOKOT) 8.8 MG/5ML syrup 5 mL, 5 mL, Oral, BID PRN, Ralene OkUgowe, Francis, MD   sodium chloride flush (NS) 0.9 % injection 10-40 mL, 10-40 mL, Intracatheter, Q12H, Bensimhon, Daniel R, MD, 10 mL at 05/19/19 1000   sodium chloride flush (NS) 0.9 % injection 10-40 mL, 10-40 mL, Intracatheter, PRN, Bensimhon, Bevelyn Bucklesaniel R, MD   vancomycin (VANCOCIN) IVPB 750 mg/150 ml premix, 750 mg, Intravenous, Q12H, Skains, Veverly FellsMark C, MD, Stopped  at 05/19/19 0303   Physical Exam:   Vitals:   05/19/19 0900 05/19/19 1000  BP: 111/66 136/78  Pulse:    Resp: (!) 29 (!) 24  Temp: 98.4 F (36.9 C) 98.2 F (36.8 C)  SpO2: 97% 93%   Physical Exam Lines: RT IJ Swan-Ganz, Foley, PIV x  2, LT radial A-line Gen: chronically ill WM in moderate respiratory distress who is extremely hard of hearing but alert and oriented x 3 Head: NCAT, no temporal wasting evident EENT: PERRL, EOMI, slight LT sided ptosis, MMM, adequate dentition Neck: supple, moderate JVD CV: tachycardic rate, irregular rate and rhythm, II/VI SEM at LLSB and II/VI diastolic murmur loudest at apex Pulm: CTA bilaterally except scant bibasilar crackles, frequent retractions with nasal flaring, no expiratory wheeze appreciated Abd: soft, NTND, +BS Extrems:  1+ pitting LE edema, 1+ pulses Skin: no rashes, adequate skin turgor Neuro: CN II-XII grossly intact with the exception of extremely poor hearing, ? LT sided facial droop and ptosis, gait was not assessed, A&Ox 3  Review of Systems:  Review of Systems  Constitutional: Positive for chills and fever. Negative for weight loss.  HENT: Negative for congestion, hearing loss, sinus pain and sore throat.   Eyes: Negative for blurred vision, photophobia and discharge.  Respiratory: Negative for cough, hemoptysis and shortness of breath.   Cardiovascular: Negative for chest pain, palpitations, orthopnea and leg swelling.  Gastrointestinal: Negative for abdominal pain, constipation, diarrhea, heartburn, nausea and vomiting.  Genitourinary: Negative for dysuria, flank pain, frequency and urgency.  Musculoskeletal: Negative for back pain, joint pain and myalgias.  Skin: Negative for itching and rash.  Neurological: Positive for weakness. Negative for tremors, seizures and headaches.       Advanced hearing loss, chronic  Endo/Heme/Allergies: Negative for polydipsia. Does not bruise/bleed easily.  Psychiatric/Behavioral: Negative for depression and substance abuse. The patient is not nervous/anxious and does not have insomnia.      Lab Results  Component Value Date   WBC 8.2 05/19/2019   HGB 11.2 (L) 05/19/2019   HCT 33.0 (L) 05/19/2019   MCV 99.7 05/19/2019   PLT  56 (L) 05/19/2019    Lab Results  Component Value Date   CREATININE 1.09 05/19/2019   BUN 52 (H) 05/19/2019   NA 140 05/19/2019   K 3.7 05/19/2019   CL 104 05/19/2019   CO2 22 05/19/2019    Lab Results  Component Value Date   ALT 69 (H) 05/19/2019   AST 95 (H) 05/19/2019   ALKPHOS 53 05/19/2019     Microbiology: Recent Results (from the past 240 hour(s))  SARS Coronavirus 2 [facility] order, Performed in [facility] hospital lab) Nasopharyngeal Nasopharyngeal Swab     Status: None   Collection Time: 06/12/19  8:20 PM   Specimen: Nasopharyngeal Swab  Result Value Ref Range Status   SARS Coronavirus 2 NEGATIVE NEGATIVE Final    Comment: (NOTE) If result is NEGATIVE SARS-CoV-2 target nucleic acids are NOT DETECTED. The SARS-CoV-2 RNA is generally detectable in upper and lower  respiratory specimens during the acute phase of infection. The lowest  concentration of SARS-CoV-2 viral copies this assay can detect is 250  copies / mL. A negative result does not preclude SARS-CoV-2 infection  and should not be used as the sole basis for treatment or other  patient management decisions.  A negative result may occur with  improper specimen collection / handling, submission of specimen other  than nasopharyngeal swab, presence of viral mutation(s) within the  areas targeted by this assay, and inadequate number of viral copies  (<250 copies / mL). A negative result must be combined with clinical  observations, patient history, and epidemiological information. If result is POSITIVE SARS-CoV-2 target nucleic acids are DETECTED. The SARS-CoV-2 RNA is generally detectable in upper and lower  respiratory specimens dur ing the acute phase of infection.  Positive  results are indicative of active infection with SARS-CoV-2.  Clinical  correlation with patient history and other diagnostic information is  necessary to determine patient infection status.  Positive results do  not rule out  bacterial infection or co-infection with other viruses. If result is PRESUMPTIVE POSTIVE SARS-CoV-2 nucleic acids MAY BE PRESENT.   A presumptive positive result was obtained on the submitted specimen  and confirmed on repeat testing.  While 2019 novel coronavirus  (SARS-CoV-2) nucleic acids may be present in the submitted sample  additional confirmatory testing may be necessary for epidemiological  and / or clinical management purposes  to differentiate between  SARS-CoV-2 and other Sarbecovirus currently known to infect humans.  If clinically indicated additional testing with an alternate test  methodology [phone]) is advised. The SARS-CoV-2 RNA is generally  detectable in upper and lower respiratory sp ecimens during the acute  phase of infection. The expected result is Negative. Fact Sheet for Patients:  BoilerBrush.com.cy Fact Sheet for Healthcare Providers: https://pope.com/ This test is not yet approved or cleared by the [place] FDA and has been authorized for detection and/or diagnosis of SARS-CoV-2 by FDA under an Emergency Use Authorization (EUA).  This EUA will remain in effect (meaning this test can be used) for the duration of the COVID-19 declaration under Section 564(b)(1) of the Act, 21 U.S.C. section 360bbb-3(b)(1), unless the authorization is terminated or revoked sooner. Performed at [facility], [address] [place], [place] [address]   Respiratory Panel by PCR     Status: None   Collection Time: 05/17/19  2:03 AM   Specimen: Respiratory  Result Value Ref Range Status   Adenovirus NOT DETECTED NOT DETECTED Final   Coronavirus 229E NOT DETECTED NOT DETECTED Final    Comment: (NOTE) The Coronavirus on the Respiratory Panel, DOES NOT test for the novel  Coronavirus (2019 nCoV)    Coronavirus HKU1 NOT DETECTED NOT DETECTED Final   Coronavirus NL63 NOT DETECTED NOT DETECTED Final   Coronavirus OC43  NOT DETECTED NOT DETECTED Final   Metapneumovirus NOT DETECTED NOT DETECTED Final   Rhinovirus / Enterovirus NOT DETECTED NOT DETECTED Final   Influenza A NOT DETECTED NOT DETECTED Final   Influenza B NOT DETECTED NOT DETECTED Final   Parainfluenza Virus 1 NOT DETECTED NOT DETECTED Final   Parainfluenza Virus 2 NOT DETECTED NOT DETECTED Final   Parainfluenza Virus 3 NOT DETECTED NOT DETECTED Final   Parainfluenza Virus 4 NOT DETECTED NOT DETECTED Final   Respiratory Syncytial Virus NOT DETECTED NOT DETECTED Final   Bordetella pertussis NOT DETECTED NOT DETECTED Final   Chlamydophila pneumoniae NOT DETECTED NOT DETECTED Final   Mycoplasma pneumoniae NOT DETECTED NOT DETECTED Final    Comment: Performed at [facility], [address] [place], [place] [address]  MRSA PCR Screening     Status: None   Collection Time: 05/17/19  2:03 AM   Specimen: Nasopharyngeal  Result Value Ref Range Status   MRSA by PCR NEGATIVE NEGATIVE Final    Comment:        The GeneXpert MRSA Assay (FDA approved for NASAL specimens  only), is one component of a comprehensive MRSA colonization surveillance program. It is not intended to diagnose MRSA infection nor to guide or monitor treatment for MRSA infections. Performed at [facility], [address] [place], [place] [address]   Culture, blood (routine x 2)     Status: Abnormal   Collection Time: 05/17/19  2:40 AM   Specimen: BLOOD LEFT HAND  Result Value Ref Range Status   Specimen Description BLOOD LEFT HAND  Final   Special Requests   Final    BOTTLES DRAWN AEROBIC ONLY Blood Culture results may not be optimal due to an inadequate volume of blood received in culture bottles   Culture  Setup Time   Final    GRAM POSITIVE COCCI IN PAIRS AND CHAINS AEROBIC BOTTLE ONLY CRITICAL RESULT CALLED TO, READ BACK BY AND VERIFIED WITH: A. MEYER, PHARMD AT 1935 ON 05/17/19 BY C. JESSUP, MT.    Culture (A)  Final    ENTEROCOCCUS  FAECALIS SUSCEPTIBILITIES PERFORMED ON PREVIOUS CULTURE WITHIN THE LAST 5 DAYS. Performed at [facility], [address], [place], [place] [address]    Report Status 05/19/2019 FINAL  Final  Culture, blood (routine x 2)     Status: Abnormal   Collection Time: 05/17/19  2:44 AM   Specimen: BLOOD RIGHT HAND  Result Value Ref Range Status   Specimen Description BLOOD RIGHT HAND  Final   Special Requests   Final    BOTTLES DRAWN AEROBIC ONLY Blood Culture results may not be optimal due to an inadequate volume of blood received in culture bottles   Culture  Setup Time   Final    GRAM POSITIVE COCCI IN PAIRS AND CHAINS AEROBIC BOTTLE ONLY CRITICAL RESULT CALLED TO, READ BACK BY AND VERIFIED WITH: A. MEYER, PHARMD AT 1935 ON 05/17/19 BY C. JESSUP, MT. Performed at [facility], [address], [place], [place] [address]    Culture ENTEROCOCCUS FAECALIS (A)  Final   Report Status 05/19/2019 FINAL  Final   Organism ID, Bacteria ENTEROCOCCUS FAECALIS  Final      Susceptibility   Enterococcus faecalis - MIC*    AMPICILLIN <=2 SENSITIVE Sensitive     VANCOMYCIN 2 SENSITIVE Sensitive     GENTAMICIN SYNERGY SENSITIVE Sensitive     * ENTEROCOCCUS FAECALIS  Blood Culture ID Panel (Reflexed)     Status: Abnormal   Collection Time: 05/17/19  2:44 AM  Result Value Ref Range Status   Enterococcus species DETECTED (A) NOT DETECTED Final    Comment: CRITICAL RESULT CALLED TO, READ BACK BY AND VERIFIED WITH: A. MEYER, PHARMD AT 1935 ON 05/17/19 BY C. JESSUP, MT.    Vancomycin resistance NOT DETECTED NOT DETECTED Final   Listeria monocytogenes NOT DETECTED NOT DETECTED Final   Staphylococcus species NOT DETECTED NOT DETECTED Final   Staphylococcus aureus (BCID) NOT DETECTED NOT DETECTED Final   Streptococcus species NOT DETECTED NOT DETECTED Final   Streptococcus agalactiae NOT DETECTED NOT DETECTED Final   Streptococcus pneumoniae NOT DETECTED NOT DETECTED Final   Streptococcus pyogenes  NOT DETECTED NOT DETECTED Final   Acinetobacter baumannii NOT DETECTED NOT DETECTED Final   Enterobacteriaceae species NOT DETECTED NOT DETECTED Final   Enterobacter cloacae complex NOT DETECTED NOT DETECTED Final   Escherichia coli NOT DETECTED NOT DETECTED Final   Klebsiella oxytoca NOT DETECTED NOT DETECTED Final   Klebsiella pneumoniae NOT DETECTED NOT DETECTED Final   Proteus species NOT DETECTED NOT DETECTED Final  Serratia marcescens NOT DETECTED NOT DETECTED Final   Haemophilus influenzae NOT DETECTED NOT DETECTED Final   Neisseria meningitidis NOT DETECTED NOT DETECTED Final   Pseudomonas aeruginosa NOT DETECTED NOT DETECTED Final   Candida albicans NOT DETECTED NOT DETECTED Final   Candida glabrata NOT DETECTED NOT DETECTED Final   Candida krusei NOT DETECTED NOT DETECTED Final   Candida parapsilosis NOT DETECTED NOT DETECTED Final   Candida tropicalis NOT DETECTED NOT DETECTED Final    Comment: Performed at [facility], [place] [address] [place], [place] [address]  Culture, Urine     Status: None   Collection Time: 05/17/19  3:37 AM   Specimen: Urine, Catheterized  Result Value Ref Range Status   Specimen Description URINE, CATHETERIZED  Final   Special Requests NONE  Final   Culture   Final    NO GROWTH Performed at [place] [address], [place], [place] [address]    Report Status 05/18/2019 FINAL  Final    Impression/Plan: The patient is a 78 year old white male with significant cardiac history including severe aortic stenosis and ASD status post ASD repair and SAVR in 2006, history of mitral valve endocarditis treated medically in 2013, and TAVR in 2016 now admitted with fever septic shock from Enterococcal bacteremia and concern for prosthetic valve endocarditis.  1. Enterococcal sepsis - The patient is extremely high risk for prosthetic valve endocarditis, so I agree with proceeding with a transesophageal echocardiogram tomorrow to further  characterize his pathology.  Recognizing that the patient may be a poor surgical candidate, if infection to his valve is confirmed to the aortic valve position where his prior surgeries and TAVR device are seated, he still likely should be assessed by the cardiac surgery service for consideration of formal valve replacement.  The presence of what appears to be a new CNS infarct is concerning for septic embolization, which in most circumstances again prompts a cardiac surgery evaluation.  Continue the patient's ampicillin and add ceftriaxone 2 gm IV q 12 hrs to hypersensitive penicillin-binding proteins along the cell wall of his Enterococcus for synergistic killing of the pathogen.  F/u repeat blood cultures x2 to assess for clearance of his BSI (thus far remain negative).  If the patient fails to clear his bacteremia promptly, we may need to consider adding synergistic gentamicin and work to remove invasive lines placed while he was bacteremic.  The addition of synergistic gentamicin would likely bring significant renal toxicity in a patient with unstable hemodynamics and advanced age with questionable chronic renal insufficiency per history.  Furthermore, if he does have evidence of prosthetic valve endocarditis, would consider adding rifampin to his medical regimen as well.  Rifampin would likely interact with his amiodarone and other possible cardiac medications, however.  We can certainly attempt medical treatment, but I am pessimistic regarding cure of prosthetic valve endocarditis with medical monotherapy, even with a prolonged 6-week course.   2. Fever -most likely secondary to the patient's Enterococcal sepsis.  Repeat blood cultures x2 today to assess for clearance of his bacteremia following changes in antibiotic therapy as above.  Would have a low threshold for repeating the patient's chest x-ray as he will remain at risk for florid CHF and secondary pneumonia as well.  3. ? CRI - pt's Cr is  currently below his reported baseline (possibly due to active pressor use). Check BMP daily given potential renal toxicities of medications. Dose ABX for CrCl of 60 at present.  34 minutes  of critical care time spent

## 2019-05-19 NOTE — Consult Note (Addendum)
Neurology Consultation  Reason for Consult: Stroke Referring Physician: Elsworth Soho  History is obtained from: Chart/patient  HPI: Justin Valencia is a 78 y.o. male with history of aortic valve replacement, numbness tingling in hands, mitral valve regurgitation, hypertension, hyperlipidemia, gait disorder, cerebrovascular disease, carotid artery stenosis, atrial fibrillation rate controlled presented to the hospital with progressive weakness and a fall at home.  Patient noted to be hypotensive and dyspneic with new evidence of reduced EF.  Patient was found to have enterococcus bacteremia.  As patient was altered, CT head was obtained which showed acute/subacute infarction in the right frontal parietal junction. Neurology was consulted for recommendations.patient started on anticoagulation with heparin drip per atrial fibrillation.  Patient is very SOB and unable to to give good history  Chart review -patient had been seen previously back on 12/04/2014 by Dr. Jannifer Franklin but this was solely for a functional and also memory exam.  Work up that has been done: CT head, echocardiogram,   LKW: Unknown tpa given?: no, no known last normal Premorbid modified Rankin scale (mRS): 0 NIHSS 12  ROS: A 14 point ROS was performed and is negative except as noted in the HPI.   Past Medical History:  Diagnosis Date  . Aortic stenosis    Aortic valve replacement 2006  . ASD secundum    Closure, October, 2006  . Atrial fibrillation (HCC)    Postoperative, previously treated with amiodarone and Coumadin, then stop  . Bilateral carotid artery disease ([place])   . CAD, multiple vessel    Stress echo, January , 2012, no ischemia  . Carotid artery disease (HCC)    Bilateral, followed closely by Dr. Bridgett Larsson ([facility]), February, 2012  . Cerebrovascular disease 12/04/2014  . Chest wall pain    October, 2012  . Chronic back pain    And sternal  . Diabetes mellitus type II   . Ejection fraction    EF, . 60-65%,echo, January,  2012  . 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 Hx of CABG    2006, aortic valve replacement, ASD closure  . Hyperlipidemia    Abnormal LFTs in the past, Lipitor discontinued, followed by Dr. Nadara Mustard he  . Hypertension   . Mitral regurgitation    Echo, January, 2012  . Nephrolithiasis   . Numbness and tingling in hands    October, 2012  . S/P aortic valve replacement    2006, bovine pericardial tissue valve /   , Normal function, echo, January, 2012    Family History  Problem Relation Age of Onset  . Heart attack Mother   . Heart failure Father   . COPD Father   . Healthy Sister   . Healthy Brother   . Healthy Sister   . Healthy Sister   . Healthy Brother    Social History:   reports that he quit smoking about 14 years ago. His smoking use included cigarettes. He has a 12.00 pack-year smoking history. He has never used smokeless tobacco. He reports that he does not drink alcohol or use drugs.  Medications  Current Facility-Administered Medications:  .  0.9 %  sodium chloride infusion, 250 mL, Intravenous, Continuous, Clois Dupes, MD, Stopped at 05/19/19 1103 .  0.9 %  sodium chloride infusion, , Intravenous, PRN, Bensimhon, Shaune Pascal, MD, Last Rate: 10 mL/hr at 05/19/19 1300 .  acetaminophen (TYLENOL) suppository 650 mg, 650 mg, Rectal, Q4H PRN, Jerline Pain, MD, 650 mg at 05/17/19 1700 .  acetaminophen (TYLENOL) tablet 650 mg, 650 mg, Oral,  Q4H PRN, Ralene OkUgowe, Francis, MD .  [address][COMPLETED] amiodarone (NEXTERONE) 1.8 mg/mL load via infusion 150 mg, 150 mg, Intravenous, Once, 150 mg at 05/17/19 1045 **FOLLOWED BY** [EXPIRED] amiodarone (NEXTERONE PREMIX) 360-4.14 MG/200ML-% (1.8 mg/mL) IV infusion, 60 mg/hr, Intravenous, Continuous, Stopped at 05/17/19 1616 **FOLLOWED BY** amiodarone (NEXTERONE PREMIX) 360-4.14 MG/200ML-% (1.8 mg/mL) IV infusion, 30 mg/hr, Intravenous, Continuous, Bensimhon, Bevelyn Bucklesaniel R, MD, Last Rate: 16.67 mL/hr at 05/19/19 1300, 30 mg/hr at 05/19/19 1300 .  ampicillin (OMNIPEN) 2  g in sodium chloride 0.9 % 100 mL IVPB, 2 g, Intravenous, Q4H, Jake BatheSkains, Mark C, MD, Stopped at 05/19/19 1230 .  cefTRIAXone (ROCEPHIN) 2 g in sodium chloride 0.9 % 100 mL IVPB, 2 g, Intravenous, Q12H, Powers, Arley PhenixJames N, MD .  chlorhexidine (PERIDEX) 0.12 % solution 15 mL, 15 mL, Mouth Rinse, BID, Jake BatheSkains, Mark C, MD, 15 mL at 05/19/19 0933 .  Chlorhexidine Gluconate Cloth 2 % PADS 6 each, 6 each, Topical, Daily, Skains, Mark C, MD, 6 each at 05/18/19 2200 .  furosemide (LASIX) injection 80 mg, 80 mg, Intravenous, Once, Bensimhon, Bevelyn Bucklesaniel R, MD .  heparin ADULT infusion 100 units/mL (25000 units/24650mL sodium chloride 0.45%), 1,400 Units/hr, Intravenous, Continuous, Bensimhon, Bevelyn Bucklesaniel R, MD, Last Rate: 14 mL/hr at 05/19/19 1300, 1,400 Units/hr at 05/19/19 1300 .  influenza vaccine adjuvanted (FLUAD) injection 0.5 mL, 0.5 mL, Intramuscular, Prior to discharge, Jake BatheSkains, Mark C, MD .  insulin aspart (novoLOG) injection 0-9 Units, 0-9 Units, Subcutaneous, Q6H, Ralene OkUgowe, Francis, MD, 2 Units at 05/19/19 1146 .  MEDLINE mouth rinse, 15 mL, Mouth Rinse, BID, Jake BatheSkains, Mark C, MD, 15 mL at 05/19/19 0002 .  MEDLINE mouth rinse, 15 mL, Mouth Rinse, q12n4p, Skains, Mark C, MD, 15 mL at 05/18/19 1600 .  milrinone (PRIMACOR) 20 MG/100 ML (0.2 mg/mL) infusion, 0.25 mcg/kg/min, Intravenous, Continuous, Bensimhon, Bevelyn Bucklesaniel R, MD, Last Rate: 7.15 mL/hr at 05/19/19 1300, 0.25 mcg/kg/min at 05/19/19 1300 .  norepinephrine (LEVOPHED) 16 mg in 250mL premix infusion, 8 mcg/min, Intravenous, Titrated, Bensimhon, Bevelyn Bucklesaniel R, MD, Last Rate: 7.5 mL/hr at 05/19/19 1300, 8 mcg/min at 05/19/19 1300 .  pantoprazole (PROTONIX) injection 40 mg, 40 mg, Intravenous, Daily, Ralene OkUgowe, Francis, MD, 40 mg at 05/19/19 0926 .  sennosides (SENOKOT) 8.8 MG/5ML syrup 5 mL, 5 mL, Oral, BID PRN, Ralene OkUgowe, Francis, MD .  sodium chloride flush (NS) 0.9 % injection 10-40 mL, 10-40 mL, Intracatheter, Q12H, Bensimhon, Bevelyn Bucklesaniel R, MD, 10 mL at 05/19/19 1000 .  sodium chloride  flush (NS) 0.9 % injection 10-40 mL, 10-40 mL, Intracatheter, PRN, Bensimhon, Bevelyn Bucklesaniel R, MD   Exam: Current vital signs: BP 123/65   Pulse 80   Temp 98.2 F (36.8 C)   Resp (!) 29   Ht 5\' 11"  (1.803 m)   Wt 95.3 kg   SpO2 96%   BMI 29.30 kg/m  Vital signs in last 24 hours: Temp:  [98.1 F (36.7 C)-99.5 F (37.5 C)] 98.2 F (36.8 C) (09/14 1100) Pulse Rate:  [80] 80 (09/14 0400) Resp:  [23-35] 29 (09/14 1100) BP: (111-136)/(58-94) 123/65 (09/14 1100) SpO2:  [88 %-97 %] 96 % (09/14 1100) Arterial Line BP: (104-134)/(49-64) 128/57 (09/14 1100) Weight:  [95.3 kg] 95.3 kg (09/14 0421)  Physical Exam  Constitutional: Appears well-developed and well-nourished.  Psych: Affect appropriate to situation Eyes: No scleral injection HENT: No OP obstrucion Head: Normocephalic.  Cardiovascular: Normal rate and regular rhythm.  Respiratory: Effort normal, non-labored breathing GI: Soft.  No distension. There is no tenderness.  Skin: WDI  Neuro: Mental Status: Patient  is awake, extremely short of breath.  Attempts to communicate but can only get out 1 word sentences.  Follows simple commands the best of his ability secondary to weakness. No signs of neglect.  Dysarthric secondary to weakness and voice Cranial Nerves: II: Visual Fields are full.  III,IV, VI: EOMI without ptosis or diploplia. Pupils equal, round and reactive to light V: Facial sensation is symmetric to temperature VII: Facial movement is symmetric.  VIII: hearing is intact to voice X: Palat elevates symmetrically XI: Shoulder shrug is symmetric. XII: tongue is midline without atrophy or fasciculations.  Motor: Patient is able to lift his arms off the bed approximately 2 to 3 inches but no more.  There is no increased tone. Sensory: Sensation is symmetric to light touch and temperature in the arms and legs. Deep Tendon Reflexes: 2+ and symmetric in the biceps and patellae.  Plantars: Toes are downgoing  bilaterally.  Cerebellar: Unable to attain secondary to weakness  Labs I have reviewed labs in epic and the results pertinent to this consultation are:   CBC    Component Value Date/Time   WBC 8.2 05/19/2019 0430   RBC 3.23 (L) 05/19/2019 0430   HGB 11.2 (L) 05/19/2019 0440   HCT 33.0 (L) 05/19/2019 0440   PLT 56 (L) 05/19/2019 0430   MCV 99.7 05/19/2019 0430   MCH 35.0 (H) 05/19/2019 0430   MCHC 35.1 05/19/2019 0430   RDW 14.6 05/19/2019 0430   LYMPHSABS 0.6 (L) 05/21/2019 2018   MONOABS 0.3 06/03/2019 2018   EOSABS 0.0 05/14/2019 2018   BASOSABS 0.1 05/24/2019 2018    CMP     Component Value Date/Time   NA 140 05/19/2019 0440   K 3.7 05/19/2019 0440   CL 104 05/19/2019 0430   CO2 22 05/19/2019 0430   GLUCOSE 205 (H) 05/19/2019 0430   BUN 52 (H) 05/19/2019 0430   CREATININE 1.09 05/19/2019 0430   CALCIUM 7.8 (L) 05/19/2019 0430   PROT 5.9 (L) 05/19/2019 0430   ALBUMIN 2.5 (L) 05/19/2019 0430   AST 95 (H) 05/19/2019 0430   ALT 69 (H) 05/19/2019 0430   ALKPHOS 53 05/19/2019 0430   BILITOT 1.3 (H) 05/19/2019 0430   GFRNONAA >60 05/19/2019 0430   GFRAA >60 05/19/2019 0430    Lipid Panel  No results found for: CHOL, TRIG, HDL, CHOLHDL, VLDL, LDLCALC, LDLDIRECT   Imaging I have reviewed the images obtained:  CT-scan of the brain-acute or subacute infarction at the right frontoparietal junction.  Region of involvement measures about 3 cm.  MRI examination of the brain-will be obtained  CTA of head and neck-pending  Felicie MornDavid Smith PA-C [facility] [phone]  M-F  (9:00 am- 5:00 PM)  05/19/2019, 2:05 PM    NEUROHOSPITALIST ADDENDUM Performed a face to face diagnostic evaluation.   I have reviewed the contents of history and physical exam as documented by PA/ARNP/Resident and agree with above documentation.  I have discussed and formulated the above plan as documented. Edits to the note have been made as needed.  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  Was noted to be febrile in the hospital and blood cultures obtained were positive for enterococcus and currently on antibiotics.  Patient also developed A. fib with RVR and was started on low-dose heparin drip.  Due to altered mental status CT head was obtained yesterday which showed a subacute infarct in the right frontoparietal lobe with no hemorrhagic conversion.  TTE shows EF of 20 to 25%, no LV thrombus was identified.  There is severe prosthetic valve stenosis, unable to identify any vegetations.  TEE was not able to be obtained due to patient's persistent dyspnea, cardiology considering performing TEE when respiratory status improves or patient becomes intubated. Neurology consulted for recommendations after CT head demonstrated stroke.  Exam Patient is awake but somnolent, easily arousable, oriented to himself and place.  Answers questions appropriately but very dyspneic and answers in one-word sentences.  He follows simple commands.  Examination of cranial nerves was normal.  Had 4/ 5 strength in all 4 extremities with decreased voluntary effort.  Sensation was normal bilaterally and no neglect.  Plantars were downgoing.  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 -Discussed with cardiology and discontinued heparin drip. -CT angiogram to evaluate for possible mycotic aneurysms -MRI brain when stable -Continue to treat bacteremia per ID  recommendations -N.p.o. until patient passes swallow evaluation -A1c and lipid profile  Neurology will continue to follow. ( Stroke team)   This patient is neurologically critically ill due to acute stroke in the setting of endocarditis and atrial fibrillation.  He is at risk for significant risk of neurological worsening from  hemorrhagic conversion vs recurrent stroke, infection, respiratory failure and seizure. This patient's care requires constant monitoring of vital signs, hemodynamics, respiratory and cardiac monitoring, review of multiple databases, neurological assessment, discussion with family, other specialists and medical decision making of high complexity.  I spent 45  minutes of neurocritical time in the care of this patient independent of time spent by PA-C.        Georgiana Spinner Aroor MD [facility] [phone]   If 7pm to 7am, please call on call as listed on AMION.

## 2019-05-20 ENCOUNTER — Inpatient Hospital Stay (HOSPITAL_COMMUNITY): Payer: Medicare HMO

## 2019-05-20 DIAGNOSIS — J9601 Acute respiratory failure with hypoxia: Secondary | ICD-10-CM

## 2019-05-20 DIAGNOSIS — I339 Acute and subacute endocarditis, unspecified: Secondary | ICD-10-CM

## 2019-05-20 DIAGNOSIS — I6340 Cerebral infarction due to embolism of unspecified cerebral artery: Secondary | ICD-10-CM | POA: Insufficient documentation

## 2019-05-20 LAB — POCT I-STAT 7, (LYTES, BLD GAS, ICA,H+H)
Acid-Base Excess: 1 mmol/L (ref 0.0–2.0)
Acid-Base Excess: 2 mmol/L (ref 0.0–2.0)
Bicarbonate: 25.4 mmol/L (ref 20.0–28.0)
Bicarbonate: 28.1 mmol/L — ABNORMAL HIGH (ref 20.0–28.0)
Calcium, Ion: 1.19 mmol/L (ref 1.15–1.40)
Calcium, Ion: 1.19 mmol/L (ref 1.15–1.40)
HCT: 32 % — ABNORMAL LOW (ref 39.0–52.0)
HCT: 33 % — ABNORMAL LOW (ref 39.0–52.0)
Hemoglobin: 10.9 g/dL — ABNORMAL LOW (ref 13.0–17.0)
Hemoglobin: 11.2 g/dL — ABNORMAL LOW (ref 13.0–17.0)
O2 Saturation: 93 %
O2 Saturation: 100 %
Patient temperature: 97.9
Patient temperature: 98.1
Potassium: 3.6 mmol/L (ref 3.5–5.1)
Potassium: 3.9 mmol/L (ref 3.5–5.1)
Sodium: 144 mmol/L (ref 135–145)
Sodium: 144 mmol/L (ref 135–145)
TCO2: 27 mmol/L (ref 22–32)
TCO2: 30 mmol/L (ref 22–32)
pCO2 arterial: 38.4 mmHg (ref 32.0–48.0)
pCO2 arterial: 49.5 mmHg — ABNORMAL HIGH (ref 32.0–48.0)
pH, Arterial: 7.427 (ref 7.350–7.450)
pH, Arterial: 7.361 (ref 7.350–7.450)
pO2, Arterial: 64 mmHg — ABNORMAL LOW (ref 83.0–108.0)
pO2, Arterial: 294 mmHg — ABNORMAL HIGH (ref 83.0–108.0)

## 2019-05-20 LAB — COMPREHENSIVE METABOLIC PANEL
ALT: 57 U/L — ABNORMAL HIGH (ref 0–44)
AST: 58 U/L — ABNORMAL HIGH (ref 15–41)
Albumin: 2.4 g/dL — ABNORMAL LOW (ref 3.5–5.0)
Alkaline Phosphatase: 64 U/L (ref 38–126)
Anion gap: 10 (ref 5–15)
BUN: 44 mg/dL — ABNORMAL HIGH (ref 8–23)
CO2: 26 mmol/L (ref 22–32)
Calcium: 8.1 mg/dL — ABNORMAL LOW (ref 8.9–10.3)
Chloride: 108 mmol/L (ref 98–111)
Creatinine, Ser: 1.09 mg/dL (ref 0.61–1.24)
GFR calc Af Amer: >60 mL/min (ref >60)
GFR calc non Af Amer: >60 mL/min (ref >60)
Glucose, Bld: 171 mg/dL — ABNORMAL HIGH (ref 70–99)
Potassium: 3.2 mmol/L — ABNORMAL LOW (ref 3.5–5.1)
Sodium: 144 mmol/L (ref 135–145)
Total Bilirubin: 1 mg/dL (ref 0.3–1.2)
Total Protein: 5.9 g/dL — ABNORMAL LOW (ref 6.5–8.1)

## 2019-05-20 LAB — GLUCOSE, CAPILLARY
Glucose-Capillary: 143 mg/dL — ABNORMAL HIGH (ref 70–99)
Glucose-Capillary: 150 mg/dL — ABNORMAL HIGH (ref 70–99)
Glucose-Capillary: 156 mg/dL — ABNORMAL HIGH (ref 70–99)
Glucose-Capillary: 162 mg/dL — ABNORMAL HIGH (ref 70–99)
Glucose-Capillary: 183 mg/dL — ABNORMAL HIGH (ref 70–99)
Glucose-Capillary: 205 mg/dL — ABNORMAL HIGH (ref 70–99)

## 2019-05-20 LAB — CBC
HCT: 31.3 % — ABNORMAL LOW (ref 39.0–52.0)
Hemoglobin: 11.2 g/dL — ABNORMAL LOW (ref 13.0–17.0)
MCH: 36.1 pg — ABNORMAL HIGH (ref 26.0–34.0)
MCHC: 35.8 g/dL (ref 30.0–36.0)
MCV: 101 fL — ABNORMAL HIGH (ref 80.0–100.0)
Platelets: 97 10*3/uL — ABNORMAL LOW (ref 150–400)
RBC: 3.1 MIL/uL — ABNORMAL LOW (ref 4.22–5.81)
RDW: 15 % (ref 11.5–15.5)
WBC: 9.1 10*3/uL (ref 4.0–10.5)
nRBC: 0.4 % — ABNORMAL HIGH (ref 0.0–0.2)

## 2019-05-20 LAB — COOXEMETRY PANEL
Carboxyhemoglobin: 1.6 % — ABNORMAL HIGH (ref 0.5–1.5)
Methemoglobin: 1 % (ref 0.0–1.5)
O2 Saturation: 66.2 %
Total hemoglobin: 11.3 g/dL — ABNORMAL LOW (ref 12.0–16.0)

## 2019-05-20 LAB — TYPE AND SCREEN
ABO/RH(D): A POS
Antibody Screen: NEGATIVE

## 2019-05-20 MED ORDER — ORAL CARE MOUTH RINSE
15.0000 mL | OROMUCOSAL | Status: DC
  Administered 2019-05-20 – 2019-05-26 (×54): 15 mL via OROMUCOSAL

## 2019-05-20 MED ORDER — ASPIRIN 300 MG RE SUPP: 300.0000 mg | Freq: Every day | RECTAL | Status: DC

## 2019-05-20 MED ORDER — MIDAZOLAM HCL 2 MG/2ML IJ SOLN
2.0000 mg | Freq: Once | INTRAMUSCULAR | Status: AC
  Administered 2019-05-20: 16:00:00 2 mg via INTRAVENOUS

## 2019-05-20 MED ORDER — POTASSIUM CHLORIDE 20 MEQ/15ML (10%) PO SOLN
40.0000 meq | Freq: Once | ORAL | Status: AC
  Administered 2019-05-20: 40 meq via ORAL
  Filled 2019-05-20: qty 30

## 2019-05-20 MED ORDER — ETOMIDATE 2 MG/ML IV SOLN
5.0000 mg | Freq: Once | INTRAVENOUS | Status: AC
  Administered 2019-05-20: 5 mg via INTRAVENOUS

## 2019-05-20 MED ORDER — MIDAZOLAM HCL 2 MG/2ML IJ SOLN
INTRAMUSCULAR | Status: AC
  Administered 2019-05-20: 2 mg via INTRAVENOUS
  Filled 2019-05-20: qty 2

## 2019-05-20 MED ORDER — FUROSEMIDE 10 MG/ML IJ SOLN
40.0000 mg | Freq: Once | INTRAMUSCULAR | Status: AC
  Administered 2019-05-20: 40 mg via INTRAVENOUS
  Filled 2019-05-20: qty 4

## 2019-05-20 MED ORDER — POTASSIUM CHLORIDE 10 MEQ/100ML IV SOLN
10.0000 meq | INTRAVENOUS | Status: AC
  Administered 2019-05-20 (×4): 10 meq via INTRAVENOUS
  Filled 2019-05-20 (×4): qty 100

## 2019-05-20 MED ORDER — FENTANYL CITRATE (PF) 100 MCG/2ML IJ SOLN
100.0000 ug | Freq: Once | INTRAMUSCULAR | Status: AC
  Administered 2019-05-20: 15:00:00 100 ug via INTRAVENOUS

## 2019-05-20 MED ORDER — FAMOTIDINE IN NACL 20-0.9 MG/50ML-% IV SOLN: 20.0000 mg | Freq: Two times a day (BID) | INTRAVENOUS | Status: DC

## 2019-05-20 MED ORDER — ENOXAPARIN SODIUM 40 MG/0.4ML ~~LOC~~ SOLN
40.0000 mg | SUBCUTANEOUS | Status: DC
  Administered 2019-05-20 – 2019-05-21 (×2): 40 mg via SUBCUTANEOUS
  Filled 2019-05-20 (×2): qty 0.4

## 2019-05-20 MED ORDER — FENTANYL BOLUS VIA INFUSION
25.0000 ug | INTRAVENOUS | Status: DC | PRN
  Administered 2019-05-23 (×2): 25 ug via INTRAVENOUS
  Filled 2019-05-20: qty 25

## 2019-05-20 MED ORDER — FENTANYL CITRATE (PF) 100 MCG/2ML IJ SOLN: 25.0000 ug | Freq: Once | INTRAMUSCULAR | Status: AC

## 2019-05-20 MED ORDER — FENTANYL CITRATE (PF) 100 MCG/2ML IJ SOLN
INTRAMUSCULAR | Status: AC
  Administered 2019-05-20: 100 ug via INTRAVENOUS
  Filled 2019-05-20: qty 2

## 2019-05-20 MED ORDER — CHLORHEXIDINE GLUCONATE 0.12% ORAL RINSE (MEDLINE KIT)
15.0000 mL | Freq: Two times a day (BID) | OROMUCOSAL | Status: DC
  Administered 2019-05-20 – 2019-05-26 (×12): 15 mL via OROMUCOSAL

## 2019-05-20 MED ORDER — PROPOFOL 1000 MG/100ML IV EMUL: 0.0000 ug/kg/min | INTRAVENOUS | Status: DC

## 2019-05-20 MED ORDER — INSULIN ASPART 100 UNIT/ML ~~LOC~~ SOLN
0.0000 [IU] | SUBCUTANEOUS | Status: DC
  Administered 2019-05-20: 2 [IU] via SUBCUTANEOUS
  Administered 2019-05-20: 3 [IU] via SUBCUTANEOUS
  Administered 2019-05-21 – 2019-05-22 (×6): 2 [IU] via SUBCUTANEOUS
  Administered 2019-05-22: 3 [IU] via SUBCUTANEOUS

## 2019-05-20 MED ORDER — SUCCINYLCHOLINE CHLORIDE 20 MG/ML IJ SOLN
100.0000 mg | Freq: Once | INTRAMUSCULAR | Status: AC
  Administered 2019-05-20: 15:00:00 100 mg via INTRAVENOUS

## 2019-05-20 MED ORDER — FENTANYL 2500MCG IN NS 250ML (10MCG/ML) PREMIX INFUSION
25.0000 ug/h | INTRAVENOUS | Status: DC
  Administered 2019-05-20: 16:00:00 50 ug/h via INTRAVENOUS
  Administered 2019-05-21: 200 ug/h via INTRAVENOUS
  Administered 2019-05-22 – 2019-05-23 (×2): 25 ug/h via INTRAVENOUS
  Administered 2019-05-24: 125 ug/h via INTRAVENOUS
  Filled 2019-05-20 (×6): qty 250

## 2019-05-20 NOTE — Procedures (Signed)
Orogastric tube placement under video laryngoscopy visualization. + gastric sounds when tube flushed.   Julian Hy, DO 05/20/19 3:49 PM [facility]

## 2019-05-20 NOTE — Progress Notes (Addendum)
NAME:  Justin Valencia, MRN:  [id number], DOB:  [birth year], LOS: 0 ADMISSION DATE:  25-May-2019, CONSULTATION DATE:  May 25, 2019 REFERRING MD:  [facility], CHIEF COMPLAINT:  Hypotension   HPI  78 y/o M admitted 9/11 with several days of progressive weakness and a fall at home.  On admit, he was dyspneic and hypotensive.  Elevated troponin & ECHO with new evidence of decreased EF of 20-25%.  PA catheter placed with parameters consistent with profound cardiogenic shock.  Blood cultures with 2/2 enterococcus.    Past Medical History  HTN HLD CAD - s/p CABG 2006 with AVR (bovine) and ASD closure  Mitral Regurgitation  Bilateral carotid artery disease  AF - on amiodarone + coumadin  DM II   Cultures  COVID 9/11 >> negative  RVP 9/12 >> negative  BCID 9/12 >> enterococcus species UC 9/12 >> negative  BCx2 9/12 >> enterococcus faecalis >> pan-sensitive  BCx2 9/13 >>   Antibiotics  Cefepime 9/12 >> 9/13  Vanco 9/12 >> 9/14  Ampicillin 9/14 >>  Rocephin 9/12, 9/14 >>   Lines/Tubes  R IJ Cordis 9/12 >>  ETT 9/15 >>   Events  9/11 Admit  9/12 PA cath with profound cardiogenic shock, BC with enterococcus, AFwRVR, 6L 9/14 PCCM s/o.  PA 52/23, CVP 8, SVR 1149, PCWP 23 9/15 PCCM called back for respiratory insufficiency, intubated   Significant Diagnostic Tests  ECHO 9/12 >> LVEF 20-25%, LVEDP ~ , LAP ~ 40 CT Head 9/13 >> acute or subacute infarct in the R frontoparietal junction CTA Head/Neck 9/14 >> no emergent LVO, mild proximal L PCA stenosis, moderate stenosis of the proximal internal carotid arteries bilaterally, intramural hematoma over the distal aortic arch, bilateral upper lobe airspace disease with effusions   Interim history/subjective:  Called by primary service for respiratory insufficiency.  Patient intubated by Dr. Chestine Spore.   Objective   BP (!) 144/70   Pulse (!) 115   Temp 97.9 F (36.6 C) (Oral)   Resp (!) 26   Ht 5\' 11"  (1.803 m)   Wt 92.5 kg    SpO2 99%   BMI 28.44 kg/m   I/O last 3 completed shifts: In: 3631.6 [I.V.:2481.3; IV Piggyback:1150.3] Out: 3757 [Urine:3757] Total I/O In: 648.2 [I.V.:234.3; IV Piggyback:413.9] Out: 325 [Urine:325]  Filed Weights   05/18/19 0500 05/19/19 0421 05/20/19 0435  Weight: 93.1 kg 95.3 kg 92.5 kg    Examination: General:  Elderly male sitting up in bed with gurgling respirations prior to intubation HEENT: MM pink/moist, upper dentures removed prior to intubation, ETT Neuro: Arouses prior to intubation, able to answer one word questions but slow to respond CV: s1s2 irr irr, no m/r/g PULM:  Non-labored on vent, lungs bilaterally coarse / vent assisted breaths GI: soft, bsx4 hypo-active  Extremities: warm/dry, no edema  Skin: no rashes or lesions.  R IJ cordis   Labs   Recent Labs  Lab 05/18/19 0615  05/19/19 0430 05/19/19 0440 05/20/19 0443 05/20/19 1225  HGB 11.5*   < > 11.3* 11.2* 11.2* 10.9*  HCT 33.0*   < > 32.2* 33.0* 31.3* 32.0*  WBC 5.8  --  8.2  --  9.1  --   PLT 53*  --  56*  --  97*  --    < > = values in this interval not displayed.   Recent Labs  Lab May 25, 2019 2018 05/25/19 2039  05/17/19 0802  05/18/19 0615  05/18/19 2306 05/19/19 0430 05/19/19 0440 05/20/19 8110  05/20/19 1225  NA 132* 135   < > 136   < > 138   < > 140 139 140 144 144  K 3.1* 3.0*   < > 3.3*   < > 3.4*   < > 3.5 3.5 3.7 3.2* 3.6  CL 102 103  --  106  --  108  --   --  104  --  108  --   CO2 16*  --   --  15*  --  17*  --   --  22  --  26  --   GLUCOSE 145* 143*  --  104*  --  196*  --   --  205*  --  171*  --   BUN 58* 52*  --  63*  --  63*  --   --  52*  --  44*  --   CREATININE 2.14* 2.00*  --  1.90*  --  1.33*  --   --  1.09  --  1.09  --   CALCIUM 9.0  --   --  8.8*  --  8.2*  --   --  7.8*  --  8.1*  --   MG 2.4  --   --  2.3  --  2.5*  --   --   --   --   --   --    < > = values in this interval not displayed.    Assessment & Plan:   Cardiogenic Shock  Acute Systolic CHF  / Concern for Infective Endocarditis LBBB Aortic Stenosis s/p Bovine AVR, MR CAD s/p CABG, Carotid Artery Disease Intramural Hematoma over Distal Aortic Arch -concern for possible AV insufficiency with enterococcus bacteremia and CVA P: Continue milrinone / levophed per CHF service  Follow lactate, SvO2, CVP ICU monitoring  Continue ASA PR  Await TEE findings   Enterococcus Bacteremia P: Continue abx as above  Follow cultures to maturity  ID follow  Consider removal of cordis and place new central line / PICC. If able, provide line holiday.   Subacute R Frontal Parietal CVA -CTA head neg for LVO P: Stroke team following, appreciate assistance  Follow frequent neuro exams  AFwRVR P: Amiodarone gtt per Cardiology  Tele monitoring   Acute Hypoxic Respiratory Failure  Bilateral Airspace Disease  Bilateral Pleural Effusions -RVP negative  P: Now intubation for airway protection, concern for possible aspiration event PRVC 8cc/kg  Wean PEEP / FiO2 for sats > 90% ABG in one hour post intubation  Follow up CXR  VAP prevention measures  Will need SLP evaluation post extubation   Thrombocytopenia  -suspect related to valvular disease + enterococcus bacteremia  P: Follow CBC  AKI Hypokalemia  P: Trend BMP / urinary output Replace electrolytes as indicated Avoid nephrotoxic agents, ensure adequate renal perfusion  DM P: SSI, sensitive scale   At Risk Malnutrition  P: Consider TF in am 9/16   Best Practice  SUP: Pepcid  DVT:  Lovenox  PAD/Sedation: Fentanyl gtt  Disposition: ICU Family Communication: Updated per primary   CC Time:   CRITICAL CARE Performed by: Canary BrimBrandi Plato Alspaugh   Total critical care time: 35 minutes  Critical care time was exclusive of separately billable procedures and treating other patients.  Critical care was necessary to treat or prevent imminent or life-threatening deterioration.  Critical care was time spent personally by me on  the following activities: development of treatment plan with patient and/or surrogate as well as  nursing, discussions with consultants, evaluation of patient's response to treatment, examination of patient, obtaining history from patient or surrogate, ordering and performing treatments and interventions, ordering and review of laboratory studies, ordering and review of radiographic studies, pulse oximetry and re-evaluation of patient's condition.   Noe Gens, NP-C [facility] Pgr: [phone] or if no answer [phone] 05/20/2019, 3:45 PM

## 2019-05-20 NOTE — Progress Notes (Signed)
Completed an initial visit with Mr and Mrs. Justin Valencia, letting Mrs. Munch know about chaplain services.  Will follow-up as needed.

## 2019-05-20 NOTE — Progress Notes (Addendum)
IR.  Patient with history of contrast extravasation 05/19/2019 while in CT (approximately 100 cc contrast and 40 cc saline was administered via LUE peripheral IV). Site has been evaluated by IR PA yesterday, site (swelling) was also marked by RN yesterday.  Patient laying in bed resting. He opens eyes to voice. He seems pleasantly confused, does not follow commands. LUE IV has been removed. There is mild swelling of the LUE at IV site, decreased since yesterday (swelling is approximately 1 cm within skin marking from yesterday). There is no erythema. There is no discoloration. There are no blisters. Of note, there is skin breakdown at left medial epicondyle that RN states was present prior to CT scan. There are no signs of decreased perfusion of the skin.  It is warm to touch.  The patient has normal ROM in fingers bilaterally.  Radial pulse 2+ bilaterally.  Per contrast extravasation protocol: - Keep an ice pack on the area for 20-60 minutes at a time for about 48 hours (24 hours remain). - Keep arm elevated as much as possible.  Please call the radiology department if there is: - increase in pain or swelling - changed or altered sensation - ulceration or blistering - increasing redness - warmth or increasing firmness - decreased tissue perfusion as noted by decreased capillary refill or discoloration of skin - decreased pulses peripheral to site  IR to follow.   Justin Graff [name], PA-C 05/20/2019, 10:28 AM

## 2019-05-20 NOTE — Procedures (Signed)
Intubation Procedure Note Justin Valencia [id number] [birth year]  Procedure: Intubation Indications: Respiratory insufficiency  Procedure Details Consent: Unable to obtain consent because of altered level of consciousness. Time Out: Verified patient identification, verified procedure, site/side was marked, verified correct patient position, special equipment/implants available, medications/allergies/relevent history reviewed, required imaging and test results available.  Performed  Maximum sterile technique was used including gloves and hand hygiene.  MAC and 3    Evaluation Hemodynamic Status: BP stable throughout; O2 sats: stable throughout Patient's Current Condition: stable Complications: No apparent complications Patient did tolerate procedure well. Chest X-ray ordered to verify placement.  CXR: pending.   Julian Hy 05/20/2019

## 2019-05-20 NOTE — Evaluation (Signed)
Clinical/Bedside Swallow Evaluation Patient Details  Name: Justin Valencia MRN: [id number] Date of Birth: 12/22/1940  Today's Date: 05/20/2019 Time: SLP Start Time (ACUTE ONLY): 1224 SLP Stop Time (ACUTE ONLY): 1247 SLP Time Calculation (min) (ACUTE ONLY): 23 min  Past Medical History:  Past Medical History:  Diagnosis Date  . Aortic stenosis    Aortic valve replacement 2006  . ASD secundum    Closure, October, 2006  . Atrial fibrillation (HCC)    Postoperative, previously treated with amiodarone and Coumadin, then stop  . Bilateral carotid artery disease (HCC)   . CAD, multiple vessel    Stress echo, January , 2012, no ischemia  . Carotid artery disease (HCC)    Bilateral, followed closely by Dr. Imogene Burnhen ([facility]), February, 2012  . Cerebrovascular disease 12/04/2014  . Chest wall pain    October, 2012  . Chronic back pain    And sternal  . Diabetes mellitus type II   . Ejection fraction    EF, . 60-65%,echo, January, 2012  . 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 Hx of CABG    2006, aortic valve replacement, ASD closure  . Hyperlipidemia    Abnormal LFTs in the past, Lipitor discontinued, followed by Dr. Dimas AguasHoward he  . Hypertension   . Mitral regurgitation    Echo, January, 2012  . Nephrolithiasis   . Numbness and tingling in hands    October, 2012  . S/P aortic valve replacement    2006, bovine pericardial tissue valve /   , Normal function, echo, January, 2012   Past Surgical History:  Past Surgical History:  Procedure Laterality Date  . AORTIC VALVE REPLACEMENT  06/2005   Bovine pericardial tissue/ Secundum ASD closure  . CHOLECYSTECTOMY    . CORONARY ARTERY BYPASS GRAFT  06/2005   LIMA to LAD, SVG to first diagonal, SVG to first obtuse marginal, SVG to PDA  . INGUINAL HERNIA REPAIR  2002   Bilateral  . NOSE SURGERY    . VASECTOMY     HPI:  78 y.o. male admitted with profound cardiogenic shock.  PMHx aortic valve replacement, numbness tingling in hands, mitral valve  regurgitation, hypertension, hyperlipidemia, gait disorder, cerebrovascular disease, carotid artery stenosis, atrial fibrillation rate controlled presented to the hospital with progressive weakness and a fall at home.  Patient noted to be hypotensive and dyspneic with new evidence of reduced EF.  Patient was found to have enterococcus bacteremia.  As patient was altered, CT head was obtained which showed acute/subacute infarction in the right frontal parietal junction.    Assessment / Plan / Recommendation Clinical Impression  Pt presents with concerns for dysphagia with aspiration.  Oral mechanism exam is unremarkable.  Speech is clear, with low volume. He demonstrates immediate and explosive coughing after 100% trials of ice chips and teaspoons water.  Cough is congested/wet.  He is SOB throughout exam, creating concerns for impaired synchrony of swallow and respiration, potentially leading to aspiration.  Recommend continuing NPO for now; SLP will f/u next date for status and to determine necessity of instrumental swallow study. D/w pt's wife, Charity fundraiserN.  SLP Visit Diagnosis: Dysphagia, unspecified (R13.10)    Aspiration Risk    high   Diet Recommendation   npo  Medication Administration: Via alternative means    Other  Recommendations Oral Care Recommendations: Oral care QID   Follow up Recommendations Other (comment)(tba)      Frequency and Duration min 3x week  2 weeks       Prognosis Prognosis  for Safe Diet Advancement: Good      Swallow Study   General Date of Onset: 05/17/19 HPI: 78 y.o. male admitted with profound cardiogenic shock.  PMHx aortic valve replacement, numbness tingling in hands, mitral valve regurgitation, hypertension, hyperlipidemia, gait disorder, cerebrovascular disease, carotid artery stenosis, atrial fibrillation rate controlled presented to the hospital with progressive weakness and a fall at home.  Patient noted to be hypotensive and dyspneic with new evidence of  reduced EF.  Patient was found to have enterococcus bacteremia.  As patient was altered, CT head was obtained which showed acute/subacute infarction in the right frontal parietal junction.  Type of Study: Bedside Swallow Evaluation Previous Swallow Assessment: no Diet Prior to this Study: NPO;Other (Comment)(sips and chips) Temperature Spikes Noted: No Respiratory Status: Nasal cannula(5 l) History of Recent Intubation: No Behavior/Cognition: Lethargic/Drowsy Oral Cavity Assessment: Within Functional Limits Oral Care Completed by SLP: Recent completion by staff Oral Cavity - Dentition: Other (Comment)(upper plate) Self-Feeding Abilities: Needs assist Patient Positioning: Upright in bed Baseline Vocal Quality: Low vocal intensity Volitional Cough: Strong Volitional Swallow: Able to elicit    Oral/Motor/Sensory Function Overall Oral Motor/Sensory Function: Within functional limits   Ice Chips Ice chips: Impaired Presentation: Spoon Pharyngeal Phase Impairments: Cough - Immediate   Thin Liquid Thin Liquid: Impaired Presentation: Spoon Pharyngeal  Phase Impairments: Cough - Immediate    Nectar Thick Nectar Thick Liquid: Not tested   Honey Thick Honey Thick Liquid: Not tested   Puree Puree: Not tested   Solid     Solid: Not tested      Juan Quam Laurice 05/20/2019,12:42 PM   Estill Bamberg L. Tivis Ringer, [place] Office number [phone] Pager [phone]

## 2019-05-20 NOTE — Progress Notes (Signed)
  This afternoon patient developed increasing respiratory distress and progressive lethargy. No significant improvement with IV lasix.  Unable to protect airway or respond appropriately.   I discussed with patient's wife at bedside and decision made to proceed with endotracheal intubation.  CCM paged and case discussed.  Dr. Carlis Abbott and her team intubated patient emergently and adjusted vent parameters.  Will plan for TEE in am.  Wife updated.   Additional CCT 35 mins.   Glori Bickers, MD  10:20 PM

## 2019-05-20 NOTE — Plan of Care (Addendum)
Pt does follow commands but does not assist when turning and has generalized weakness. Pt is able to move all extremities. Pt's blood pressure has improved some throughout the night, levophed gtt has been weaned down. Aline monitoring stopped working, line flushes well but has no blood return and it's dampened. Pt has a had 60-300 cc of urine every hour. Pt's pain improved some, no signs of pain noted at this time. Pt is alert to voice, oriented to self, but has to be reoriented to place, time, and situation often. Pt has not attempted to pull at lines or get out of bed. Pt has activity intolerance and gets easily short of breath when lying flat. Pt's left arm is slightly less swollen than last night. It is elevated on a pillow and has an ice pack. Pt voices no concerns or complaints at this time. Son was updated last night. Call bell is within reach, bed is in lowest position, floor mats are in place, will continue to monitor. Problem: Clinical Measurements: Goal: Ability to maintain clinical measurements within normal limits will improve Outcome: Progressing   Problem: Elimination: Goal: Will not experience complications related to urinary retention Outcome: Progressing   Problem: Pain Managment: Goal: General experience of comfort will improve Outcome: Progressing   Problem: Safety: Goal: Ability to remain free from injury will improve Outcome: Progressing

## 2019-05-20 NOTE — Progress Notes (Signed)
STROKE TEAM PROGRESS NOTE   INTERVAL HISTORY I have personally reviewed history of presenting illness, electronic medical records as well as imaging films in PACS.  I have also spoken to the patient's wife at the bedside.  He presented with 1 week history of progressive fever with chills and worsening symptoms and developed left hemiparesis.  CT scan shows right frontoparietal hypodensity likely compatible with embolic stroke and in the given setting of sepsis strong suspicion for bacterial endocarditis and septic emboli.  He also developed A. fib with rapid ventricular rate and heart failure which could also contribute  Vitals:   05/20/19 0700 05/20/19 0735 05/20/19 0800 05/20/19 1118  BP: (!) 83/57  97/64   Pulse:   (!) 113   Resp: (!) 29  (!) 27   Temp:  98.2 F (36.8 C)  97.9 F (36.6 C)  TempSrc:  Oral  Oral  SpO2: 94%  95%   Weight:      Height:        CBC:  Recent Labs  Lab 05/18/2019 2018  05/19/19 0430 05/19/19 0440 05/20/19 0443  WBC 7.7   < > 8.2  --  9.1  NEUTROABS 6.3  --   --   --   --   HGB 11.7*   < > 11.3* 11.2* 11.2*  HCT 34.8*   < > 32.2* 33.0* 31.3*  MCV 103.0*   < > 99.7  --  101.0*  PLT 67*   < > 56*  --  97*   < > = values in this interval not displayed.    Basic Metabolic Panel:  Recent Labs  Lab 05/17/19 0802  05/18/19 0615  05/19/19 0430 05/19/19 0440 05/20/19 0443  NA 136   < > 138   < > 139 140 144  K 3.3*   < > 3.4*   < > 3.5 3.7 3.2*  CL 106  --  108  --  104  --  108  CO2 15*  --  17*  --  22  --  26  GLUCOSE 104*  --  196*  --  205*  --  171*  BUN 63*  --  63*  --  52*  --  44*  CREATININE 1.90*  --  1.33*  --  1.09  --  1.09  CALCIUM 8.8*  --  8.2*  --  7.8*  --  8.1*  MG 2.3  --  2.5*  --   --   --   --    < > = values in this interval not displayed.   Lipid Panel: No results found for: CHOL, TRIG, HDL, CHOLHDL, VLDL, LDLCALC HgbA1c:  Lab Results  Component Value Date   HGBA1C 5.9 (H) 05/17/2019   Urine Drug Screen:      Component Value Date/Time   LABOPIA NONE DETECTED 05/17/2019 0336   COCAINSCRNUR NONE DETECTED 05/17/2019 0336   LABBENZ NONE DETECTED 05/17/2019 0336   AMPHETMU NONE DETECTED 05/17/2019 0336   THCU NONE DETECTED 05/17/2019 0336   LABBARB NONE DETECTED 05/17/2019 0336    Alcohol Level     Component Value Date/Time   ETH <10 05/17/2019 0246    IMAGING Ct Angio Head W Or Wo Contrast  Result Date: 05/19/2019 CLINICAL DATA:  Focal neuro deficit for greater than 6 hours. Abnormal CT of the head yesterday with age indeterminate right parietal lobe infarcts. EXAM: CT ANGIOGRAPHY HEAD AND NECK TECHNIQUE: Multidetector CT imaging of the head and neck was  performed using the standard protocol during bolus administration of intravenous contrast. Multiplanar CT image reconstructions and MIPs were obtained to evaluate the vascular anatomy. Carotid stenosis measurements (when applicable) are obtained utilizing NASCET criteria, using the distal internal carotid diameter as the denominator. CONTRAST:  75mL OMNIPAQUE IOHEXOL 350 MG/ML SOLN COMPARISON:  CT head without contrast 05/18/2019. FINDINGS: CTA NECK FINDINGS Aortic arch: A 3 vessel arch configuration is present. Atherosclerotic calcifications are present at the aortic arch and origins the great vessels without significant stenosis. There is a channel extending laterally from the proximal left subclavian artery 18 mm. This is essentially in an aortic dissection plane that communicates not with the aorta at the subclavian. Coronal dimensions are 5 x 10 mm. This is most consistent with an intramural hematoma. Right carotid system: Calcifications are present at the origin of the right common carotid artery without a significant stenosis. The right common carotid artery is tortuous. Bifurcation is unremarkable. Dense calcifications are present in the proximal right ICA just beyond the bifurcation. The lumen is narrowed 1.5 mm. There is marked tortuosity of the  cervical right ICA without associated stenosis. Additional mural calcifications are present in the distal right internal carotid artery just below the skull base. The right ICA measures 5 mm in the mid cervical component. Left carotid system: The left common carotid artery is within normal limits. Dense calcifications are present in the proximal left ICA. The lumen is narrowed to 1.5 mm. There is moderate tortuosity of the cervical left ICA with diffuse mural calcification there is no significant stenosis relative to the more distal vessel. Vertebral arteries: The vertebral arteries originate from the subclavian arteries without significant stenosis. The right vertebral artery is the dominant vessel. There is moderate vessel irregularity and lateral mural plaque at C2, C3, and C4 without a significant stenosis of greater than 50% relative to the more distal vessel. There is no significant stenosis in the left vertebral artery. Skeleton: Slight degenerative retrolisthesis present at C4-5. Endplate Schmorl's nodes are present throughout the cervical spine. Uncovertebral spurring contributes to foraminal narrowing the left at C5-6 greater than C6-7. No focal lytic or blastic lesions are present. Other neck: Salivary glands are within normal limits. No focal mucosal or submucosal lesions are present. Thyroid is normal limits. Prominent paratracheal lymph nodes are present in the upper mediastinum. Upper chest: Bilateral pleural effusions are present. Upper lobe airspace consolidation is present bilaterally, right greater left. CTA HEAD FINDINGS Anterior circulation: Moderate stenosis are present petrous internal carotid arteries bilaterally. Extensive calcifications are present through the cavernous internal carotid arteries with high-grade stenoses bilaterally. The right A1 segment is the dominant vessel. There is a high-grade stenosis in the left A1 segment. Anterior communicating artery is patent. Distal ACA branches  are normal bilaterally. A M1 segments are normal. MCA bifurcations are within normal limits. There is marked attenuation of medium and distal small vessels bilaterally. Posterior circulation: Moderate stenosis is associated with calcified plaque at the dural margin of the right vertebral artery. Vertebrobasilar junction is normal. There is some irregularity of the basilar artery without focal stenosis. The posterior cerebral arteries originate from the basilar tip. Left posterior communicating artery contributes. Mild proximal left P1 segment stenosis is present. There is segmental narrowing of distal PCA branch vessels bilaterally. Venous sinuses: The dural sinuses are patent. Straight sinus deep cerebral veins are intact. Cortical veins are unremarkable. Anatomic variants: None Review of the MIP images confirms the above findings IMPRESSION: 1. No emergent large vessel occlusion to  explain the patient's right MCA territory infarct. 2. Extensive atherosclerotic irregularity throughout the circle-of-Willis involving medium and distal small vessels. 3. High-grade stenosis in the cavernous internal carotid arteries bilaterally. 4. Mild proximal left PCA stenosis. 5. Moderate stenosis of the dominant right vertebral artery at the dural margin. 6. Moderate stenoses of the proximal internal carotid arteries bilaterally. The lumen is narrowed to 1.5 mm on each side. 7. Intramural hematoma over the distal aortic arch. The lumen communicates with the subclavian artery rather than directly with the aortic arch. 8. Bilateral upper lobe airspace disease and bilateral effusions consistent with infection. Electronically Signed   By: Marin Roberts M.D.   On: 05/19/2019 16:49   Ct Angio Neck W Or Wo Contrast  Result Date: 05/19/2019 CLINICAL DATA:  Focal neuro deficit for greater than 6 hours. Abnormal CT of the head yesterday with age indeterminate right parietal lobe infarcts. EXAM: CT ANGIOGRAPHY HEAD AND NECK  TECHNIQUE: Multidetector CT imaging of the head and neck was performed using the standard protocol during bolus administration of intravenous contrast. Multiplanar CT image reconstructions and MIPs were obtained to evaluate the vascular anatomy. Carotid stenosis measurements (when applicable) are obtained utilizing NASCET criteria, using the distal internal carotid diameter as the denominator. CONTRAST:  85mL OMNIPAQUE IOHEXOL 350 MG/ML SOLN COMPARISON:  CT head without contrast 05/18/2019. FINDINGS: CTA NECK FINDINGS Aortic arch: A 3 vessel arch configuration is present. Atherosclerotic calcifications are present at the aortic arch and origins the great vessels without significant stenosis. There is a channel extending laterally from the proximal left subclavian artery 18 mm. This is essentially in an aortic dissection plane that communicates not with the aorta at the subclavian. Coronal dimensions are 5 x 10 mm. This is most consistent with an intramural hematoma. Right carotid system: Calcifications are present at the origin of the right common carotid artery without a significant stenosis. The right common carotid artery is tortuous. Bifurcation is unremarkable. Dense calcifications are present in the proximal right ICA just beyond the bifurcation. The lumen is narrowed 1.5 mm. There is marked tortuosity of the cervical right ICA without associated stenosis. Additional mural calcifications are present in the distal right internal carotid artery just below the skull base. The right ICA measures 5 mm in the mid cervical component. Left carotid system: The left common carotid artery is within normal limits. Dense calcifications are present in the proximal left ICA. The lumen is narrowed to 1.5 mm. There is moderate tortuosity of the cervical left ICA with diffuse mural calcification there is no significant stenosis relative to the more distal vessel. Vertebral arteries: The vertebral arteries originate from the  subclavian arteries without significant stenosis. The right vertebral artery is the dominant vessel. There is moderate vessel irregularity and lateral mural plaque at C2, C3, and C4 without a significant stenosis of greater than 50% relative to the more distal vessel. There is no significant stenosis in the left vertebral artery. Skeleton: Slight degenerative retrolisthesis present at C4-5. Endplate Schmorl's nodes are present throughout the cervical spine. Uncovertebral spurring contributes to foraminal narrowing the left at C5-6 greater than C6-7. No focal lytic or blastic lesions are present. Other neck: Salivary glands are within normal limits. No focal mucosal or submucosal lesions are present. Thyroid is normal limits. Prominent paratracheal lymph nodes are present in the upper mediastinum. Upper chest: Bilateral pleural effusions are present. Upper lobe airspace consolidation is present bilaterally, right greater left. CTA HEAD FINDINGS Anterior circulation: Moderate stenosis are present petrous internal carotid  arteries bilaterally. Extensive calcifications are present through the cavernous internal carotid arteries with high-grade stenoses bilaterally. The right A1 segment is the dominant vessel. There is a high-grade stenosis in the left A1 segment. Anterior communicating artery is patent. Distal ACA branches are normal bilaterally. A M1 segments are normal. MCA bifurcations are within normal limits. There is marked attenuation of medium and distal small vessels bilaterally. Posterior circulation: Moderate stenosis is associated with calcified plaque at the dural margin of the right vertebral artery. Vertebrobasilar junction is normal. There is some irregularity of the basilar artery without focal stenosis. The posterior cerebral arteries originate from the basilar tip. Left posterior communicating artery contributes. Mild proximal left P1 segment stenosis is present. There is segmental narrowing of distal  PCA branch vessels bilaterally. Venous sinuses: The dural sinuses are patent. Straight sinus deep cerebral veins are intact. Cortical veins are unremarkable. Anatomic variants: None Review of the MIP images confirms the above findings IMPRESSION: 1. No emergent large vessel occlusion to explain the patient's right MCA territory infarct. 2. Extensive atherosclerotic irregularity throughout the circle-of-Willis involving medium and distal small vessels. 3. High-grade stenosis in the cavernous internal carotid arteries bilaterally. 4. Mild proximal left PCA stenosis. 5. Moderate stenosis of the dominant right vertebral artery at the dural margin. 6. Moderate stenoses of the proximal internal carotid arteries bilaterally. The lumen is narrowed to 1.5 mm on each side. 7. Intramural hematoma over the distal aortic arch. The lumen communicates with the subclavian artery rather than directly with the aortic arch. 8. Bilateral upper lobe airspace disease and bilateral effusions consistent with infection. Electronically Signed   By: Marin Robertshristopher  Mattern M.D.   On: 05/19/2019 16:49   Dg Chest Port 1v Same Day  Result Date: 05/19/2019 CLINICAL DATA:  Shortness of breath, CHF EXAM: PORTABLE CHEST 1 VIEW COMPARISON:  05/17/2019 FINDINGS: Cardiomegaly, prior CABG. Swan-Ganz catheter is slightly advanced further into the right lower lobe pulmonary artery. Diffuse bilateral airspace disease has worsened, likely edema. No visible significant effusions or acute bony abnormality. IMPRESSION: Worsening bilateral airspace disease, likely edema/CHF. Swan-Ganz catheter has been slightly advanced further into the right lower lobe pulmonary artery. Electronically Signed   By: Charlett NoseKevin  Dover M.D.   On: 05/19/2019 09:14   [place] Ekg Site Rite  Result Date: 05/19/2019 If Site Rite image not attached, placement could not be confirmed due to current cardiac rhythm.   PHYSICAL EXAM Frail elderly Caucasian male in mild respiratory distress.   He is on oxygen. . Afebrile. Head is nontraumatic. Neck is supple without bruit.    Cardiac exam no murmur or gallop. Lungs are clear to auscultation. Distal pulses are well felt. Neurological Exam :   patient is drowsy but arouses easily.  Is oriented to place and person.  He follows simple midline and one-step commands.  He has diminished attention, registration and recall.  Extraocular movements are full range without nystagmus.  He blinks to threat on the right but not consistently on the left.  He has left lower facial weakness.  Tongue midline.  Motor system exam shows mild left hemiparesis 4/5 strength with weakness of left grip and intrinsic hand muscles.  Fine finger movements are diminished on the left.  He can move the left lower extremity against gravity but it is weaker compared to the right.  Deep tendon reflexes symmetric.  Sensation is intact bilaterally.  Plantars are downgoing.  Gait not tested.   ASSESSMENT/PLAN Justin Valencia is a 78 y.o. male with history  of aortic valve replacement, numbness tingling in hands, mitral valve regurgitation, hypertension, hyperlipidemia, gait disorder, cerebrovascular disease, carotid artery stenosis, atrial fibrillation presenting after fall at home with progressive weakness. Found to be hypotensive w/ low EF with enterococcus bacteremia. CT showed infarct.  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  Patient is also subsequently developed atrial fibrillation during the hospitalization which also puts him at risk for further stroke  CT head acute/subacute R frontal parietal jxn infarct. Small vessel disease.   CTA head & neck no ELVO. Extensive atherosclerosis. High grade stenosis B cavernous and mod at prox B ICAs. Mild prox L PCA stenosis. Mod stenosis R VA. Intramural hematoma over distal aortic artch. BUL airspace dz w/ B effusions.   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 EF 20-25%. atrial fibrillation.   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 BP Low on arrival and remains low  Hyperlipidemia  Home meds:  lipitor 10 and lopid 600  LDL pending, goal < 70  Continue statin at discharge  Diabetes type II Controlled  HgbA1c 5.9, goal < 7.0  Other Stroke Risk Factors  Advanced age  Former Cigarette smoker, quit 14 yrs ago  Overweight, Body mass index is 28.44 kg/m., recommend weight loss, diet and exercise as appropriate   Coronary artery disease s/p CABG  B carotid disease  Aortic stenosis w/ AV replacement 2006  ASD w/ closure 2006  Hx MV endocartidits 2013  TAVR 7829  Acute systolic HF  Other Active Problems  AKI  Hypokalemia 3.2  Thrombocytopenia 56-97  LBBB  Hospital day # 3  I have personally obtained history,examined this patient, reviewed notes, independently viewed imaging studies, participated in medical decision making and plan of care.ROS completed by me personally and pertinent positives fully documented  I have made any additions or clarifications directly to the above note.  Patient presented with 1 week history of fever and chills with likely bacteremia from enterococcus and infective endocarditis.  CT scan shows a subacute right frontal lesion likely septic emboli.  However he is also since developed A. fib with rapid heart rate which puts him at risk for further embolic strokes.  I agree anticoagulation with  heparin would be dangerous in the setting of septic emboli and recommend check TEE to see if valve needs to be replaced.  Check CT scan of the head with and without contrast to look for abscess and CT angiogram to look for mycotic aneurysms.  As I feel patient may not be able to undergo an MRI in his present respiratory distress.  I had a long discussion with the patient and his wife at the bedside and answered questions about his care.  Continue aspirin for now.  Continue ongoing stroke work-up.This patient is critically ill and at significant risk of neurological worsening, death and care requires constant monitoring of vital signs, hemodynamics,respiratory and cardiac monitoring, extensive review of multiple databases, frequent  neurological assessment, discussion with family, other specialists and medical decision making of high complexity.I have made any additions or clarifications directly to the above note.This critical care time does not reflect procedure time, or teaching time or supervisory time of PA/NP/Med Resident etc but could involve care discussion time.  I spent 30 minutes of neurocritical care time  in the care of  this patient.      Delia Heady, MD Medical Director [facility] Pager: [phone] 05/20/2019 2:53 PM   To contact Stroke Continuity provider, please refer to [email or web address]. After hours, contact General Neurology

## 2019-05-20 NOTE — Progress Notes (Signed)
[facility]   Reason for visit: Follow up on Enterococcal sepsis  Antibiotics: ampicillin, day 3 Ceftriaxone, day 2  Interval History: Remains on amiodarone gtt but pressors weaned off. He failed bedside swallow study with speech pathologist earlier today, so he remains NPO. TEE postponed today due to pt's ongoing breathing issues (given 40 of lasix 2 hrs ago) and lack of anesthesia availability today. Pt's wife updated at bedside during exam. Fever curve, WBC & Cr trends, imaging, cx results, and ABX usage all independently reviewed    Current Facility-Administered Medications:    0.9 %  sodium chloride infusion, 250 mL, Intravenous, Continuous, Ralene Ok, MD, Last Rate: 10 mL/hr at 05/20/19 1100   0.9 %  sodium chloride infusion, , Intravenous, PRN, Bensimhon, Bevelyn Buckles, MD, Stopped at 05/19/19 1914   acetaminophen (TYLENOL) suppository 650 mg, 650 mg, Rectal, Q4H PRN, Jake Bathe, MD, 650 mg at 05/19/19 1630   acetaminophen (TYLENOL) tablet 650 mg, 650 mg, Oral, Q4H PRN, Ralene Ok, MD   [COMPLETED] amiodarone (NEXTERONE) 1.8 mg/mL load via infusion 150 mg, 150 mg, Intravenous, Once, 150 mg at 05/17/19 1045 **FOLLOWED BY** [EXPIRED] amiodarone (NEXTERONE PREMIX) 360-4.14 MG/200ML-% (1.8 mg/mL) IV infusion, 60 mg/hr, Intravenous, Continuous, Stopped at 05/17/19 1616 **FOLLOWED BY** amiodarone (NEXTERONE PREMIX) 360-4.14 MG/200ML-% (1.8 mg/mL) IV infusion, 30 mg/hr, Intravenous, Continuous, Bensimhon, Bevelyn Buckles, MD, Last Rate: 16.67 mL/hr at 05/20/19 1220, 30 mg/hr at 05/20/19 1220   ampicillin (OMNIPEN) 2 g in sodium chloride 0.9 % 100 mL IVPB, 2 g, Intravenous, Q4H, Skains, Mark C, MD, Last Rate: 300 mL/hr at 05/20/19 1226, 2 g at 05/20/19 1226   cefTRIAXone (ROCEPHIN) 2 g in sodium chloride 0.9 % 100 mL IVPB, 2 g, Intravenous, Q12H, Myson Levi, Arley Phenix, MD, Stopped at 05/20/19 0934   chlorhexidine (PERIDEX) 0.12 % solution 15 mL, 15 mL, Mouth  Rinse, BID, Skains, Mark C, MD, 15 mL at 05/20/19 0800   Chlorhexidine Gluconate Cloth 2 % PADS 6 each, 6 each, Topical, Daily, Jake Bathe, MD, 6 each at 05/19/19 2108   furosemide (LASIX) injection 40 mg, 40 mg, Intravenous, Once, Bensimhon, Bevelyn Buckles, MD   influenza vaccine adjuvanted (FLUAD) injection 0.5 mL, 0.5 mL, Intramuscular, Prior to discharge, Jake Bathe, MD   insulin aspart (novoLOG) injection 0-9 Units, 0-9 Units, Subcutaneous, Q6H, Ralene Ok, MD, 2 Units at 05/20/19 1153   MEDLINE mouth rinse, 15 mL, Mouth Rinse, BID, Skains, Mark C, MD, 15 mL at 05/20/19 1000   MEDLINE mouth rinse, 15 mL, Mouth Rinse, q12n4p, Skains, Mark C, MD, 15 mL at 05/20/19 1200   milrinone (PRIMACOR) 20 MG/100 ML (0.2 mg/mL) infusion, 0.25 mcg/kg/min, Intravenous, Continuous, Bensimhon, Bevelyn Buckles, MD, Last Rate: 7.15 mL/hr at 05/20/19 1100, 0.25 mcg/kg/min at 05/20/19 1100   norepinephrine (LEVOPHED) 16 mg in premix infusion, 8 mcg/min, Intravenous, Titrated, Bensimhon, Bevelyn Buckles, MD, Stopped at 05/20/19 0847   pantoprazole (PROTONIX) injection 40 mg, 40 mg, Intravenous, Daily, Ralene Ok, MD, 40 mg at 05/20/19 0900   potassium chloride 10 mEq in 100 mL IVPB, 10 mEq, Intravenous, Q1 Hr x 4, Bensimhon, Bevelyn Buckles, MD, Last Rate: 100 mL/hr at 05/20/19 1151, 10 mEq at 05/20/19 1151   sennosides (SENOKOT) 8.8 MG/5ML syrup 5 mL, 5 mL, Oral, BID PRN, Ralene Ok, MD   sodium chloride flush (NS) 0.9 % injection 10-40 mL, 10-40 mL, Intracatheter, Q12H, Bensimhon, Bevelyn Buckles, MD, 10 mL at 05/20/19 0904   sodium chloride flush (NS)  0.9 % injection 10-40 mL, 10-40 mL, Intracatheter, PRN, Bensimhon, Bevelyn Buckles, MD   Physical Exam:   Vitals:   05/20/19 1118 05/20/19 1200  BP:  118/75  Pulse:  (!) 115  Resp:  (!) 31  Temp: 97.9 F (36.6 C)   SpO2:  95%   Physical Exam Lines: RT IJ Swan-Ganz, Foley, PIV x 2, LT radial A-line Gen: chronically ill WM in moderate respiratory distress who  is extremely hard of hearing but alert and oriented x 3 Head: NCAT, no temporal wasting evident EENT: PERRL, EOMI, LT sided ptosis improving, MMM, adequate dentition Neck: supple, moderate JVD CV: tachycardic rate, irregular rate and rhythm, II/VI SEM at LLSB and II/VI diastolic murmur loudest at apex Pulm: CTA bilaterally except scant bibasilar crackles, frequent retractions with nasal flaring, no expiratory wheeze appreciated Abd: soft, NTND, +BS Extrems:  1+ pitting LE edema, 1+ pulses Skin: no rashes, adequate skin turgor Neuro: CN II-XII grossly intact with the exception of extremely poor hearing, improving LT sided ptosis, gait was not assessed, A&Ox 3  Review of Systems:  Review of Systems  Constitutional: Positive for chills, fever and malaise/fatigue. Negative for weight loss.  HENT: Negative for congestion, hearing loss, sinus pain and sore throat.   Eyes: Negative for blurred vision, photophobia and discharge.  Respiratory: Positive for cough and shortness of breath. Negative for hemoptysis.   Cardiovascular: Negative for chest pain, palpitations, orthopnea and leg swelling.  Gastrointestinal: Negative for abdominal pain, constipation, diarrhea, heartburn, nausea and vomiting.  Genitourinary: Negative for dysuria, flank pain, frequency and urgency.  Musculoskeletal: Negative for back pain, joint pain and myalgias.  Skin: Negative for itching and rash.  Neurological: Positive for weakness. Negative for tremors, seizures and headaches.       Advanced hearing loss, chronic  Endo/Heme/Allergies: Negative for polydipsia. Does not bruise/bleed easily.  Psychiatric/Behavioral: Negative for depression and substance abuse. The patient is not nervous/anxious and does not have insomnia.      Lab Results  Component Value Date   WBC 9.1 05/20/2019   HGB 10.9 (L) 05/20/2019   HCT 32.0 (L) 05/20/2019   MCV 101.0 (H) 05/20/2019   PLT 97 (L) 05/20/2019    Lab Results  Component Value  Date   CREATININE 1.09 05/20/2019   BUN 44 (H) 05/20/2019   NA 144 05/20/2019   K 3.6 05/20/2019   CL 108 05/20/2019   CO2 26 05/20/2019    Lab Results  Component Value Date   ALT 57 (H) 05/20/2019   AST 58 (H) 05/20/2019   ALKPHOS 64 05/20/2019     Microbiology: Recent Results (from the past 240 hour(s))  SARS Coronavirus 2 [facility] order, Performed in [facility] hospital lab) Nasopharyngeal Nasopharyngeal Swab     Status: None   Collection Time: 2019-06-10  8:20 PM   Specimen: Nasopharyngeal Swab  Result Value Ref Range Status   SARS Coronavirus 2 NEGATIVE NEGATIVE Final    Comment: (NOTE) If result is NEGATIVE SARS-CoV-2 target nucleic acids are NOT DETECTED. The SARS-CoV-2 RNA is generally detectable in upper and lower  respiratory specimens during the acute phase of infection. The lowest  concentration of SARS-CoV-2 viral copies this assay can detect is 250  copies / mL. A negative result does not preclude SARS-CoV-2 infection  and should not be used as the sole basis for treatment or other  patient management decisions.  A negative result may occur with  improper specimen collection / handling, submission of specimen other  than nasopharyngeal  swab, presence of viral mutation(s) within the  areas targeted by this assay, and inadequate number of viral copies  (<250 copies / mL). A negative result must be combined with clinical  observations, patient history, and epidemiological information. If result is POSITIVE SARS-CoV-2 target nucleic acids are DETECTED. The SARS-CoV-2 RNA is generally detectable in upper and lower  respiratory specimens dur ing the acute phase of infection.  Positive  results are indicative of active infection with SARS-CoV-2.  Clinical  correlation with patient history and other diagnostic information is  necessary to determine patient infection status.  Positive results do  not rule out bacterial infection or co-infection with other viruses. If  result is PRESUMPTIVE POSTIVE SARS-CoV-2 nucleic acids MAY BE PRESENT.   A presumptive positive result was obtained on the submitted specimen  and confirmed on repeat testing.  While 2019 novel coronavirus  (SARS-CoV-2) nucleic acids may be present in the submitted sample  additional confirmatory testing may be necessary for epidemiological  and / or clinical management purposes  to differentiate between  SARS-CoV-2 and other Sarbecovirus currently known to infect humans.  If clinically indicated additional testing with an alternate test  methodology [phone]) is advised. The SARS-CoV-2 RNA is generally  detectable in upper and lower respiratory sp ecimens during the acute  phase of infection. The expected result is Negative. Fact Sheet for Patients:  StrictlyIdeas.no Fact Sheet for Healthcare Providers: BankingDealers.co.za This test is not yet approved or cleared by the [place] FDA and has been authorized for detection and/or diagnosis of SARS-CoV-2 by FDA under an Emergency Use Authorization (EUA).  This EUA will remain in effect (meaning this test can be used) for the duration of the COVID-19 declaration under Section 564(b)(1) of the Act, 21 U.S.C. section 360bbb-3(b)(1), unless the authorization is terminated or revoked sooner. Performed at [facility], [place] [address], [place], [place] [address]   Respiratory Panel by PCR     Status: None   Collection Time: 05/17/19  2:03 AM   Specimen: Respiratory  Result Value Ref Range Status   Adenovirus NOT DETECTED NOT DETECTED Final   Coronavirus 229E NOT DETECTED NOT DETECTED Final    Comment: (NOTE) The Coronavirus on the Respiratory Panel, DOES NOT test for the novel  Coronavirus (2019 nCoV)    Coronavirus HKU1 NOT DETECTED NOT DETECTED Final   Coronavirus NL63 NOT DETECTED NOT DETECTED Final   Coronavirus OC43 NOT DETECTED NOT DETECTED Final   Metapneumovirus NOT  DETECTED NOT DETECTED Final   Rhinovirus / Enterovirus NOT DETECTED NOT DETECTED Final   Influenza A NOT DETECTED NOT DETECTED Final   Influenza B NOT DETECTED NOT DETECTED Final   Parainfluenza Virus 1 NOT DETECTED NOT DETECTED Final   Parainfluenza Virus 2 NOT DETECTED NOT DETECTED Final   Parainfluenza Virus 3 NOT DETECTED NOT DETECTED Final   Parainfluenza Virus 4 NOT DETECTED NOT DETECTED Final   Respiratory Syncytial Virus NOT DETECTED NOT DETECTED Final   Bordetella pertussis NOT DETECTED NOT DETECTED Final   Chlamydophila pneumoniae NOT DETECTED NOT DETECTED Final   Mycoplasma pneumoniae NOT DETECTED NOT DETECTED Final    Comment: Performed at [facility], [place]. [address], [place], [place] [address]  MRSA PCR Screening     Status: None   Collection Time: 05/17/19  2:03 AM   Specimen: Nasopharyngeal  Result Value Ref Range Status   MRSA by PCR NEGATIVE NEGATIVE Final    Comment:        The  GeneXpert MRSA Assay (FDA approved for NASAL specimens only), is one component of a comprehensive MRSA colonization surveillance program. It is not intended to diagnose MRSA infection nor to guide or monitor treatment for MRSA infections. Performed at [facility], [address], [place], [place] [address]   Culture, blood (routine x 2)     Status: Abnormal   Collection Time: 05/17/19  2:40 AM   Specimen: BLOOD LEFT HAND  Result Value Ref Range Status   Specimen Description BLOOD LEFT HAND  Final   Special Requests   Final    BOTTLES DRAWN AEROBIC ONLY Blood Culture results may not be optimal due to an inadequate volume of blood received in culture bottles   Culture  Setup Time   Final    GRAM POSITIVE COCCI IN PAIRS AND CHAINS AEROBIC BOTTLE ONLY CRITICAL RESULT CALLED TO, READ BACK BY AND VERIFIED WITH: A. MEYER, PHARMD AT 1935 ON 05/17/19 BY C. JESSUP, MT.    Culture (A)  Final    ENTEROCOCCUS FAECALIS SUSCEPTIBILITIES PERFORMED ON PREVIOUS CULTURE WITHIN THE LAST 5  DAYS. Performed at [facility], [address], [place], [place] [address]    Report Status 05/19/2019 FINAL  Final  Culture, blood (routine x 2)     Status: Abnormal   Collection Time: 05/17/19  2:44 AM   Specimen: BLOOD RIGHT HAND  Result Value Ref Range Status   Specimen Description BLOOD RIGHT HAND  Final   Special Requests   Final    BOTTLES DRAWN AEROBIC ONLY Blood Culture results may not be optimal due to an inadequate volume of blood received in culture bottles   Culture  Setup Time   Final    GRAM POSITIVE COCCI IN PAIRS AND CHAINS AEROBIC BOTTLE ONLY CRITICAL RESULT CALLED TO, READ BACK BY AND VERIFIED WITH: A. MEYER, PHARMD AT 1935 ON 05/17/19 BY C. JESSUP, MT. Performed at [facility], [address], [place], [place] [address]    Culture ENTEROCOCCUS FAECALIS (A)  Final   Report Status 05/19/2019 FINAL  Final   Organism ID, Bacteria ENTEROCOCCUS FAECALIS  Final      Susceptibility   Enterococcus faecalis - MIC*    AMPICILLIN <=2 SENSITIVE Sensitive     VANCOMYCIN 2 SENSITIVE Sensitive     GENTAMICIN SYNERGY SENSITIVE Sensitive     * ENTEROCOCCUS FAECALIS  Blood Culture ID Panel (Reflexed)     Status: Abnormal   Collection Time: 05/17/19  2:44 AM  Result Value Ref Range Status   Enterococcus species DETECTED (A) NOT DETECTED Final    Comment: CRITICAL RESULT CALLED TO, READ BACK BY AND VERIFIED WITH: A. MEYER, PHARMD AT 1935 ON 05/17/19 BY C. JESSUP, MT.    Vancomycin resistance NOT DETECTED NOT DETECTED Final   Listeria monocytogenes NOT DETECTED NOT DETECTED Final   Staphylococcus species NOT DETECTED NOT DETECTED Final   Staphylococcus aureus (BCID) NOT DETECTED NOT DETECTED Final   Streptococcus species NOT DETECTED NOT DETECTED Final   Streptococcus agalactiae NOT DETECTED NOT DETECTED Final   Streptococcus pneumoniae NOT DETECTED NOT DETECTED Final   Streptococcus pyogenes NOT DETECTED NOT DETECTED Final   Acinetobacter baumannii NOT DETECTED NOT  DETECTED Final   Enterobacteriaceae species NOT DETECTED NOT DETECTED Final   Enterobacter cloacae complex NOT DETECTED NOT DETECTED Final   Escherichia coli NOT DETECTED NOT DETECTED Final   Klebsiella oxytoca NOT DETECTED NOT DETECTED Final   Klebsiella pneumoniae NOT DETECTED NOT DETECTED Final  Proteus species NOT DETECTED NOT DETECTED Final   Serratia marcescens NOT DETECTED NOT DETECTED Final   Haemophilus influenzae NOT DETECTED NOT DETECTED Final   Neisseria meningitidis NOT DETECTED NOT DETECTED Final   Pseudomonas aeruginosa NOT DETECTED NOT DETECTED Final   Candida albicans NOT DETECTED NOT DETECTED Final   Candida glabrata NOT DETECTED NOT DETECTED Final   Candida krusei NOT DETECTED NOT DETECTED Final   Candida parapsilosis NOT DETECTED NOT DETECTED Final   Candida tropicalis NOT DETECTED NOT DETECTED Final    Comment: Performed at [facility], [address] [place], [place] [address]  Culture, Urine     Status: None   Collection Time: 05/17/19  3:37 AM   Specimen: Urine, Catheterized  Result Value Ref Range Status   Specimen Description URINE, CATHETERIZED  Final   Special Requests NONE  Final   Culture   Final    NO GROWTH Performed at [facility], [address] [place], [place] [address]    Report Status 05/18/2019 FINAL  Final  Culture, blood (routine x 2)     Status: None (Preliminary result)   Collection Time: 05/18/19  4:08 PM   Specimen: BLOOD  Result Value Ref Range Status   Specimen Description BLOOD BLOOD RIGHT WRIST  Final   Special Requests   Final    AEROBIC BOTTLE ONLY Blood Culture results may not be optimal due to an inadequate volume of blood received in culture bottles   Culture   Final    NO GROWTH 2 DAYS Performed at [facility], [address] [place], [place] [address]    Report Status PENDING  Incomplete  Culture, blood (routine x 2)     Status: None (Preliminary result)   Collection Time: 05/18/19  4:08 PM    Specimen: BLOOD  Result Value Ref Range Status   Specimen Description BLOOD BLOOD RIGHT HAND  Final   Special Requests AEROBIC BOTTLE ONLY Blood Culture adequate volume  Final   Culture   Final    NO GROWTH 2 DAYS Performed at [facility], [address] [place], [place] [address]    Report Status PENDING  Incomplete    Impression/Plan: The patient is a 78 year old white male with significant cardiac history including severe aortic stenosis and ASD status post ASD repair and SAVR in 2006, history of mitral valve endocarditis treated medically in 2013, and TAVR in 2016 now admitted with fever septic shock from Enterococcal bacteremia and concern for prosthetic valve endocarditis.  1. Enterococcal sepsis - The patient is extremely high risk for prosthetic valve endocarditis, so I agree with proceeding with a transesophageal echocardiogram tomorrow to further characterize his pathology.  Recognizing that the patient may be a poor surgical candidate, if infection to his valve is confirmed to the aortic valve position where his prior surgeries and TAVR device are seated, he still likely should be assessed by the cardiac surgery service for consideration of formal valve replacement.  The presence of what appears to be a new CNS infarct is concerning for septic embolization, which in most circumstances again prompts a cardiac surgery evaluation.  Continue the patient's ampicillin and ceftriaxone 2 gm IV q 12 hrs to hypersensitive penicillin-binding proteins along the cell wall of his Enterococcus for synergistic killing of the pathogen.  F/u repeat blood cultures x2 to assess for clearance of his BSI (thus far remain negative).  If the patient fails to clear his bacteremia, we may need  to consider adding synergistic gentamicin and work to remove invasive lines placed while he was bacteremic.  The addition of synergistic gentamicin would likely bring significant renal toxicity in a patient with  unstable/only recently improving hemodynamics and advanced age with questionable chronic renal insufficiency per history.  If he does have evidence of prosthetic valve endocarditis on TEE, would consider adding rifampin to his medical regimen as well.  Rifampin would likely interact with his amiodarone and other possible cardiac medications, however.  We can certainly attempt medical treatment, but I am pessimistic regarding cure of prosthetic valve endocarditis with medical monotherapy, even with a prolonged 6-week course.   2. Fever -Improving with empiric ABX. Most likely secondary to the patient's Enterococcal sepsis.  F/u repeat blood cultures to assess for clearance of his bacteremia following changes in antibiotic therapy as above.  Would have a low threshold for repeating the patient's chest x-ray as he will remain at risk for florid CHF and secondary pneumonia as well.  3. ? CRI - pt's Cr is currently below his reported baseline (possibly due to active pressor use). Check BMP daily given potential renal toxicities of medications. Dose ABX for CrCl of 60 at present.  31 minutes of critical care time spent

## 2019-05-20 NOTE — Progress Notes (Signed)
Advanced Heart Failure Rounding Note   Subjective:    Patient seen on rounds this am.  Remained lethargic despite support with milrinone and NE. Swan dislodged yesterday afternoon and removed.  Had periods of increased WOB this am.   Afebrile on broad spectrum abx. Repeat bcx remain negative. Back in AF despite IV amio. Heparin off given risk of hemorrhagic bleed in the setting of likely septic cerebral emboli.   Objective:   Weight Range:  Vital Signs:   Temp:  [96.7 F (35.9 C)-98.3 F (36.8 C)] 98.3 F (36.8 C) (09/15 2000) Pulse Rate:  [113-121] 121 (09/15 1600) Resp:  [14-32] 16 (09/15 2000) BP: (83-144)/(57-110) 114/70 (09/15 2000) SpO2:  [88 %-99 %] 98 % (09/15 2000) Arterial Line BP: (96-136)/(49-129) 96/56 (09/15 2000) FiO2 (%):  [40 %-100 %] 40 % (09/15 1932) Weight:  [92.5 kg] 92.5 kg (09/15 0435) Last BM Date: 05/19/19  Weight change: Filed Weights   05/18/19 0500 05/19/19 0421 05/20/19 0435  Weight: 93.1 kg 95.3 kg 92.5 kg    Intake/Output:   Intake/Output Summary (Last 24 hours) at 05/20/2019 2209 Last data filed at 05/20/2019 2000 Gross per 24 hour  Intake 1907.37 ml  Output 1585 ml  Net 322.37 ml     Physical Exam: General:  Elderly frail. Ill appearing and tachyneic HEENT: normal Neck: supple. RIJ introduce. Carotids 2+ bilat; no bruits. No lymphadenopathy or thryomegaly appreciated. Cor: PMI nondisplaced. Irregular rate & rhythm. No rubs, gallops or murmurs. Lungs: coarse Abdomen: soft, nontender, nondistended. No hepatosplenomegaly. No bruits or masses. Good bowel sounds. Extremities: no cyanosis, clubbing, rash, edema Neuro: lethargic. Arousable ,no clear focal deficits    Telemetry: AF 100-110  Personally reviewed   Labs: Basic Metabolic Panel: Recent Labs  Lab 06/01/2019 2018 05/09/2019 2039  05/17/19 0802  05/18/19 0615  05/19/19 0430 05/19/19 0440 05/20/19 0443 05/20/19 1225 05/20/19 1703  NA 132* 135   < > 136   < >  138   < > 139 140 144 144 144  K 3.1* 3.0*   < > 3.3*   < > 3.4*   < > 3.5 3.7 3.2* 3.6 3.9  CL 102 103  --  106  --  108  --  104  --  108  --   --   CO2 16*  --   --  15*  --  17*  --  22  --  26  --   --   GLUCOSE 145* 143*  --  104*  --  196*  --  205*  --  171*  --   --   BUN 58* 52*  --  63*  --  63*  --  52*  --  44*  --   --   CREATININE 2.14* 2.00*  --  1.90*  --  1.33*  --  1.09  --  1.09  --   --   CALCIUM 9.0  --   --  8.8*  --  8.2*  --  7.8*  --  8.1*  --   --   MG 2.4  --   --  2.3  --  2.5*  --   --   --   --   --   --    < > = values in this interval not displayed.    Liver Function Tests: Recent Labs  Lab 05/07/2019 2018 05/17/19 0802 05/18/19 0615 05/19/19 0430 05/20/19 0443  AST 155* 156* 138* 95*  58*  ALT 56* 65* 76* 69* 57*  ALKPHOS 58 56 66 53 64  BILITOT 1.4* 1.6* 1.3* 1.3* 1.0  PROT 6.6 6.4* 6.0* 5.9* 5.9*  ALBUMIN 3.1* 2.9* 2.5* 2.5* 2.4*   No results for input(s): LIPASE, AMYLASE in the last 168 hours. No results for input(s): AMMONIA in the last 168 hours.  CBC: Recent Labs  Lab 05/18/2019 2018  05/17/19 0400 05/17/19 0802  05/18/19 0615  05/19/19 0430 05/19/19 0440 05/20/19 0443 05/20/19 1225 05/20/19 1703  WBC 7.7  --   --  5.7  --  5.8  --  8.2  --  9.1  --   --   NEUTROABS 6.3  --   --   --   --   --   --   --   --   --   --   --   HGB 11.7*   < >  --  12.0*   < > 11.5*   < > 11.3* 11.2* 11.2* 10.9* 11.2*  HCT 34.8*   < >  --  34.4*   < > 33.0*   < > 32.2* 33.0* 31.3* 32.0* 33.0*  MCV 103.0*  --   --  99.7  --  98.8  --  99.7  --  101.0*  --   --   PLT 67*  --  65* 62*  --  53*  --  56*  --  97*  --   --    < > = values in this interval not displayed.    Cardiac Enzymes: No results for input(s): CKTOTAL, CKMB, CKMBINDEX, TROPONINI in the last 168 hours.  BNP: BNP (last 3 results) No results for input(s): BNP in the last 8760 hours.  ProBNP (last 3 results) No results for input(s): PROBNP in the last 8760 hours.    Other  results:  Imaging: Ct Angio Head W Or Wo Contrast  Result Date: 05/19/2019 CLINICAL DATA:  Focal neuro deficit for greater than 6 hours. Abnormal CT of the head yesterday with age indeterminate right parietal lobe infarcts. EXAM: CT ANGIOGRAPHY HEAD AND NECK TECHNIQUE: Multidetector CT imaging of the head and neck was performed using the standard protocol during bolus administration of intravenous contrast. Multiplanar CT image reconstructions and MIPs were obtained to evaluate the vascular anatomy. Carotid stenosis measurements (when applicable) are obtained utilizing NASCET criteria, using the distal internal carotid diameter as the denominator. CONTRAST:  32m OMNIPAQUE IOHEXOL 350 MG/ML SOLN COMPARISON:  CT head without contrast 05/18/2019. FINDINGS: CTA NECK FINDINGS Aortic arch: A 3 vessel arch configuration is present. Atherosclerotic calcifications are present at the aortic arch and origins the great vessels without significant stenosis. There is a channel extending laterally from the proximal left subclavian artery 18 mm. This is essentially in an aortic dissection plane that communicates not with the aorta at the subclavian. Coronal dimensions are 5 x 10 mm. This is most consistent with an intramural hematoma. Right carotid system: Calcifications are present at the origin of the right common carotid artery without a significant stenosis. The right common carotid artery is tortuous. Bifurcation is unremarkable. Dense calcifications are present in the proximal right ICA just beyond the bifurcation. The lumen is narrowed 1.5 mm. There is marked tortuosity of the cervical right ICA without associated stenosis. Additional mural calcifications are present in the distal right internal carotid artery just below the skull base. The right ICA measures 5 mm in the mid cervical component. Left carotid system: The left common  carotid artery is within normal limits. Dense calcifications are present in the proximal  left ICA. The lumen is narrowed to 1.5 mm. There is moderate tortuosity of the cervical left ICA with diffuse mural calcification there is no significant stenosis relative to the more distal vessel. Vertebral arteries: The vertebral arteries originate from the subclavian arteries without significant stenosis. The right vertebral artery is the dominant vessel. There is moderate vessel irregularity and lateral mural plaque at C2, C3, and C4 without a significant stenosis of greater than 50% relative to the more distal vessel. There is no significant stenosis in the left vertebral artery. Skeleton: Slight degenerative retrolisthesis present at C4-5. Endplate Schmorl's nodes are present throughout the cervical spine. Uncovertebral spurring contributes to foraminal narrowing the left at C5-6 greater than C6-7. No focal lytic or blastic lesions are present. Other neck: Salivary glands are within normal limits. No focal mucosal or submucosal lesions are present. Thyroid is normal limits. Prominent paratracheal lymph nodes are present in the upper mediastinum. Upper chest: Bilateral pleural effusions are present. Upper lobe airspace consolidation is present bilaterally, right greater left. CTA HEAD FINDINGS Anterior circulation: Moderate stenosis are present petrous internal carotid arteries bilaterally. Extensive calcifications are present through the cavernous internal carotid arteries with high-grade stenoses bilaterally. The right A1 segment is the dominant vessel. There is a high-grade stenosis in the left A1 segment. Anterior communicating artery is patent. Distal ACA branches are normal bilaterally. A M1 segments are normal. MCA bifurcations are within normal limits. There is marked attenuation of medium and distal small vessels bilaterally. Posterior circulation: Moderate stenosis is associated with calcified plaque at the dural margin of the right vertebral artery. Vertebrobasilar junction is normal. There is some  irregularity of the basilar artery without focal stenosis. The posterior cerebral arteries originate from the basilar tip. Left posterior communicating artery contributes. Mild proximal left P1 segment stenosis is present. There is segmental narrowing of distal PCA branch vessels bilaterally. Venous sinuses: The dural sinuses are patent. Straight sinus deep cerebral veins are intact. Cortical veins are unremarkable. Anatomic variants: None Review of the MIP images confirms the above findings IMPRESSION: 1. No emergent large vessel occlusion to explain the patient's right MCA territory infarct. 2. Extensive atherosclerotic irregularity throughout the circle-of-Willis involving medium and distal small vessels. 3. High-grade stenosis in the cavernous internal carotid arteries bilaterally. 4. Mild proximal left PCA stenosis. 5. Moderate stenosis of the dominant right vertebral artery at the dural margin. 6. Moderate stenoses of the proximal internal carotid arteries bilaterally. The lumen is narrowed to 1.5 mm on each side. 7. Intramural hematoma over the distal aortic arch. The lumen communicates with the subclavian artery rather than directly with the aortic arch. 8. Bilateral upper lobe airspace disease and bilateral effusions consistent with infection. Electronically Signed   By: San Morelle M.D.   On: 05/19/2019 16:49   Ct Angio Neck W Or Wo Contrast  Result Date: 05/19/2019 CLINICAL DATA:  Focal neuro deficit for greater than 6 hours. Abnormal CT of the head yesterday with age indeterminate right parietal lobe infarcts. EXAM: CT ANGIOGRAPHY HEAD AND NECK TECHNIQUE: Multidetector CT imaging of the head and neck was performed using the standard protocol during bolus administration of intravenous contrast. Multiplanar CT image reconstructions and MIPs were obtained to evaluate the vascular anatomy. Carotid stenosis measurements (when applicable) are obtained utilizing NASCET criteria, using the distal  internal carotid diameter as the denominator. CONTRAST:  36m OMNIPAQUE IOHEXOL 350 MG/ML SOLN COMPARISON:  CT head without  contrast 05/18/2019. FINDINGS: CTA NECK FINDINGS Aortic arch: A 3 vessel arch configuration is present. Atherosclerotic calcifications are present at the aortic arch and origins the great vessels without significant stenosis. There is a channel extending laterally from the proximal left subclavian artery 18 mm. This is essentially in an aortic dissection plane that communicates not with the aorta at the subclavian. Coronal dimensions are 5 x 10 mm. This is most consistent with an intramural hematoma. Right carotid system: Calcifications are present at the origin of the right common carotid artery without a significant stenosis. The right common carotid artery is tortuous. Bifurcation is unremarkable. Dense calcifications are present in the proximal right ICA just beyond the bifurcation. The lumen is narrowed 1.5 mm. There is marked tortuosity of the cervical right ICA without associated stenosis. Additional mural calcifications are present in the distal right internal carotid artery just below the skull base. The right ICA measures 5 mm in the mid cervical component. Left carotid system: The left common carotid artery is within normal limits. Dense calcifications are present in the proximal left ICA. The lumen is narrowed to 1.5 mm. There is moderate tortuosity of the cervical left ICA with diffuse mural calcification there is no significant stenosis relative to the more distal vessel. Vertebral arteries: The vertebral arteries originate from the subclavian arteries without significant stenosis. The right vertebral artery is the dominant vessel. There is moderate vessel irregularity and lateral mural plaque at C2, C3, and C4 without a significant stenosis of greater than 50% relative to the more distal vessel. There is no significant stenosis in the left vertebral artery. Skeleton: Slight  degenerative retrolisthesis present at C4-5. Endplate Schmorl's nodes are present throughout the cervical spine. Uncovertebral spurring contributes to foraminal narrowing the left at C5-6 greater than C6-7. No focal lytic or blastic lesions are present. Other neck: Salivary glands are within normal limits. No focal mucosal or submucosal lesions are present. Thyroid is normal limits. Prominent paratracheal lymph nodes are present in the upper mediastinum. Upper chest: Bilateral pleural effusions are present. Upper lobe airspace consolidation is present bilaterally, right greater left. CTA HEAD FINDINGS Anterior circulation: Moderate stenosis are present petrous internal carotid arteries bilaterally. Extensive calcifications are present through the cavernous internal carotid arteries with high-grade stenoses bilaterally. The right A1 segment is the dominant vessel. There is a high-grade stenosis in the left A1 segment. Anterior communicating artery is patent. Distal ACA branches are normal bilaterally. A M1 segments are normal. MCA bifurcations are within normal limits. There is marked attenuation of medium and distal small vessels bilaterally. Posterior circulation: Moderate stenosis is associated with calcified plaque at the dural margin of the right vertebral artery. Vertebrobasilar junction is normal. There is some irregularity of the basilar artery without focal stenosis. The posterior cerebral arteries originate from the basilar tip. Left posterior communicating artery contributes. Mild proximal left P1 segment stenosis is present. There is segmental narrowing of distal PCA branch vessels bilaterally. Venous sinuses: The dural sinuses are patent. Straight sinus deep cerebral veins are intact. Cortical veins are unremarkable. Anatomic variants: None Review of the MIP images confirms the above findings IMPRESSION: 1. No emergent large vessel occlusion to explain the patient's right MCA territory infarct. 2.  Extensive atherosclerotic irregularity throughout the circle-of-Willis involving medium and distal small vessels. 3. High-grade stenosis in the cavernous internal carotid arteries bilaterally. 4. Mild proximal left PCA stenosis. 5. Moderate stenosis of the dominant right vertebral artery at the dural margin. 6. Moderate stenoses of the proximal internal  carotid arteries bilaterally. The lumen is narrowed to 1.5 mm on each side. 7. Intramural hematoma over the distal aortic arch. The lumen communicates with the subclavian artery rather than directly with the aortic arch. 8. Bilateral upper lobe airspace disease and bilateral effusions consistent with infection. Electronically Signed   By: San Morelle M.D.   On: 05/19/2019 16:49   Dg Chest Port 1 View  Result Date: 05/20/2019 CLINICAL DATA:  Status post EG tube and OG tube placement. EXAM: PORTABLE CHEST 1 VIEW COMPARISON:  Single-view of the chest 05/19/2019. FINDINGS: New endotracheal tube is in place with the tip in good position at the level of clavicular heads approximately 3 cm above the carina. OG tube courses into the stomach and below the inferior margin of the film. Swan-Ganz catheter has been removed. Right IJ sheath remains in place. There is cardiomegaly and bilateral airspace disease most consistent with congestive failure. Small to moderate bilateral pleural effusions are larger on the left. Aeration is mildly improved with better expansion of the chest today. No pneumothorax. IMPRESSION: ET tube projects in good position approximately 3 cm above the carina. OG tube courses into the stomach and below the inferior margin the film. Bilateral airspace disease and effusions most consistent with congestive heart failure. Aeration appears mildly improved compared to yesterday's exam Electronically Signed   By: Inge Rise M.D.   On: 05/20/2019 16:02   Dg Chest Port 1v Same Day  Result Date: 05/19/2019 CLINICAL DATA:  Shortness of breath,  CHF EXAM: PORTABLE CHEST 1 VIEW COMPARISON:  05/17/2019 FINDINGS: Cardiomegaly, prior CABG. Swan-Ganz catheter is slightly advanced further into the right lower lobe pulmonary artery. Diffuse bilateral airspace disease has worsened, likely edema. No visible significant effusions or acute bony abnormality. IMPRESSION: Worsening bilateral airspace disease, likely edema/CHF. Swan-Ganz catheter has been slightly advanced further into the right lower lobe pulmonary artery. Electronically Signed   By: Rolm Baptise M.D.   On: 05/19/2019 09:14   [place] Ekg Site Rite  Result Date: 05/19/2019 If Site Rite image not attached, placement could not be confirmed due to current cardiac rhythm.    Medications:     Scheduled Medications:  aspirin  300 mg Rectal Daily   chlorhexidine gluconate (MEDLINE KIT)  15 mL Mouth Rinse BID   Chlorhexidine Gluconate Cloth  6 each Topical Daily   enoxaparin (LOVENOX) injection  40 mg Subcutaneous Q24H   insulin aspart  0-9 Units Subcutaneous Q4H   [START ON 05/21/2019] mouth rinse  15 mL Mouth Rinse 10 times per day   pantoprazole (PROTONIX) IV  40 mg Intravenous Daily   sodium chloride flush  10-40 mL Intracatheter Q12H    Infusions:  sodium chloride 10 mL/hr at 05/20/19 2000   sodium chloride Stopped (05/19/19 1914)   amiodarone 30 mg/hr (05/20/19 2000)   ampicillin (OMNIPEN) IV 2 g (05/20/19 2045)   cefTRIAXone (ROCEPHIN)  IV Stopped (05/20/19 0934)   fentaNYL infusion INTRAVENOUS 25 mcg/hr (05/20/19 2000)   milrinone 0.25 mcg/kg/min (05/20/19 2000)   norepinephrine (LEVOPHED) Adult infusion Stopped (05/20/19 0847)   propofol (DIPRIVAN) infusion      PRN Medications: sodium chloride, acetaminophen, acetaminophen, fentaNYL, influenza vaccine adjuvanted, sennosides, sodium chloride flush   Assessment/Plan:   1. Shock - Suspect combination of cardiogenic and septic shock - EF newly down 55-60% in 7/20 -> 25% here. Unclear etiology. Trop  moderately up but flat. Initial suspicion was for viral myocarditis but viral panel negative. - Initial swan numbers with low output and  co-ox 44% - Bcx 2/2 enterococcus. F/u cx remain NGTF - Hemodynamics improved with inotrope support but still quite lethargic Co-ox 66%  - Echo suggests significantly increased gradients across VIV TAVR and MV - Continue abx and hemodynamic support.  - ID consult appreciated. Continue broad spectrum abx - TEE later today or tomorrow depending on resp status.   2. Acute systolic HF - EF 22% see discussion above - Continue inotrope support with NE and milrinone -CO-OX 66%. .   - CVP 10-11 -> diurese gently   3. New onset AF with RVR  - continue amio & heparin.  - Back in AF with RVR this am. -  Continue amio back 30 mg per hour.  - heparin off due to risk of intracranial bleeding with septic emboli - Discussed dosing with PharmD personally.  4. 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  - Doubt he is surgical candidate - head CT with new infarct -> ? Septic emboli.  - Appreciate Neuro recs.  - TEE when resp status permits  5. CAD s/p CABG - trop up but doubt ACS as it is flat - will need coronary angio if creatinine permits  6. AKI - due to ATN  - Resolved with inotrope support - avoid nephrotoxic agents  7. Acute hypoic respiratory failure - very tenuous. Wife aware of need for possible intubation  - check ABG   8. DM2 - SSI  7. Acute encephalopathy.  - likely metabolic - check ammonia   8 Aortic stenosis - s/p SAVR 2006 - s/p TAVR 2016  9. Thrombocytopenia - suspect related to sepsis +/- valvular disease - PLTs 56k -> 97k  10. LBBB QRS 178 ms  CRITICAL CARE Performed  by: Glori Bickers  Total critical care time: 55 minutes  Critical care time was exclusive of separately billable procedures and treating other patients.  Critical care was necessary to treat or prevent imminent or life-threatening deterioration.  Critical care was time spent personally by me (independent of midlevel providers or residents) on the following activities: development of treatment plan with patient and/or surrogate as well as nursing, discussions with consultants, evaluation of patient's response to treatment, examination of patient, obtaining history from patient or surrogate, ordering and performing treatments and interventions, ordering and review of laboratory studies, ordering and review of radiographic studies, pulse oximetry and re-evaluation of patient's condition.   Length of Stay: 3 Glori Bickers, MD  10:09 PM

## 2019-05-20 NOTE — Progress Notes (Signed)
[facility] MD on call notified about pt's potassium 3.2 Orders recieved

## 2019-05-21 ENCOUNTER — Inpatient Hospital Stay (HOSPITAL_COMMUNITY): Payer: Medicare HMO

## 2019-05-21 DIAGNOSIS — Z9911 Dependence on respirator [ventilator] status: Secondary | ICD-10-CM

## 2019-05-21 DIAGNOSIS — N289 Disorder of kidney and ureter, unspecified: Secondary | ICD-10-CM

## 2019-05-21 DIAGNOSIS — I38 Endocarditis, valve unspecified: Secondary | ICD-10-CM

## 2019-05-21 LAB — CBC
HCT: 29.8 % — ABNORMAL LOW (ref 39.0–52.0)
Hemoglobin: 10.6 g/dL — ABNORMAL LOW (ref 13.0–17.0)
MCH: 36.1 pg — ABNORMAL HIGH (ref 26.0–34.0)
MCHC: 35.6 g/dL (ref 30.0–36.0)
MCV: 101.4 fL — ABNORMAL HIGH (ref 80.0–100.0)
Platelets: 122 10*3/uL — ABNORMAL LOW (ref 150–400)
RBC: 2.94 MIL/uL — ABNORMAL LOW (ref 4.22–5.81)
RDW: 15.3 % (ref 11.5–15.5)
WBC: 10 10*3/uL (ref 4.0–10.5)
nRBC: 1.6 % — ABNORMAL HIGH (ref 0.0–0.2)

## 2019-05-21 LAB — COOXEMETRY PANEL
Carboxyhemoglobin: 1.6 % — ABNORMAL HIGH (ref 0.5–1.5)
Methemoglobin: 1 % (ref 0.0–1.5)
O2 Saturation: 71.4 %
Total hemoglobin: 11.2 g/dL — ABNORMAL LOW (ref 12.0–16.0)

## 2019-05-21 LAB — BASIC METABOLIC PANEL
Anion gap: 12 (ref 5–15)
Anion gap: 9 (ref 5–15)
BUN: 47 mg/dL — ABNORMAL HIGH (ref 8–23)
BUN: 46 mg/dL — ABNORMAL HIGH (ref 8–23)
CO2: 24 mmol/L (ref 22–32)
CO2: 25 mmol/L (ref 22–32)
Calcium: 8.3 mg/dL — ABNORMAL LOW (ref 8.9–10.3)
Calcium: 8.3 mg/dL — ABNORMAL LOW (ref 8.9–10.3)
Chloride: 108 mmol/L (ref 98–111)
Chloride: 111 mmol/L (ref 98–111)
Creatinine, Ser: 1.29 mg/dL — ABNORMAL HIGH (ref 0.61–1.24)
Creatinine, Ser: 1.33 mg/dL — ABNORMAL HIGH (ref 0.61–1.24)
GFR calc Af Amer: >60 mL/min (ref >60)
GFR calc Af Amer: >60 mL/min (ref >60)
GFR calc non Af Amer: 54 mL/min — ABNORMAL LOW (ref >60)
GFR calc non Af Amer: 52 mL/min — ABNORMAL LOW (ref >60)
Glucose, Bld: 170 mg/dL — ABNORMAL HIGH (ref 70–99)
Glucose, Bld: 168 mg/dL — ABNORMAL HIGH (ref 70–99)
Potassium: 3.3 mmol/L — ABNORMAL LOW (ref 3.5–5.1)
Potassium: 3.7 mmol/L (ref 3.5–5.1)
Sodium: 144 mmol/L (ref 135–145)
Sodium: 145 mmol/L (ref 135–145)

## 2019-05-21 LAB — GLUCOSE, CAPILLARY
Glucose-Capillary: 151 mg/dL — ABNORMAL HIGH (ref 70–99)
Glucose-Capillary: 164 mg/dL — ABNORMAL HIGH (ref 70–99)
Glucose-Capillary: 167 mg/dL — ABNORMAL HIGH (ref 70–99)
Glucose-Capillary: 173 mg/dL — ABNORMAL HIGH (ref 70–99)
Glucose-Capillary: 176 mg/dL — ABNORMAL HIGH (ref 70–99)

## 2019-05-21 LAB — MAGNESIUM: Magnesium: 2.2 mg/dL (ref 1.7–2.4)

## 2019-05-21 LAB — TRIGLYCERIDES: Triglycerides: 146 mg/dL (ref <150)

## 2019-05-21 MED ORDER — MIDAZOLAM HCL 2 MG/2ML IJ SOLN
INTRAMUSCULAR | Status: AC
  Administered 2019-05-21: 2 mg via INTRAVENOUS
  Filled 2019-05-21: qty 6

## 2019-05-21 MED ORDER — GADOBUTROL 1 MMOL/ML IV SOLN
9.5000 mL | Freq: Once | INTRAVENOUS | Status: AC | PRN
  Administered 2019-05-21: 9.5 mL via INTRAVENOUS

## 2019-05-21 MED ORDER — ASPIRIN 81 MG PO CHEW
81.0000 mg | CHEWABLE_TABLET | Freq: Every day | ORAL | Status: DC
  Administered 2019-05-21 – 2019-05-26 (×6): 81 mg
  Filled 2019-05-21 (×6): qty 1

## 2019-05-21 MED ORDER — VITAL AF 1.2 CAL PO LIQD
1000.0000 mL | ORAL | Status: DC
  Administered 2019-05-21 – 2019-05-26 (×3): 1000 mL
  Filled 2019-05-21: qty 1000

## 2019-05-21 MED ORDER — ACETAMINOPHEN 325 MG PO TABS
650.0000 mg | ORAL_TABLET | ORAL | Status: DC | PRN
  Administered 2019-05-23: 650 mg
  Filled 2019-05-21: qty 2

## 2019-05-21 MED ORDER — POTASSIUM CHLORIDE 20 MEQ/15ML (10%) PO SOLN
20.0000 meq | ORAL | Status: AC
  Administered 2019-05-21 (×2): 20 meq
  Filled 2019-05-21 (×2): qty 15

## 2019-05-21 MED ORDER — SENNOSIDES 8.8 MG/5ML PO SYRP: 5.0000 mL | ORAL_SOLUTION | Freq: Two times a day (BID) | ORAL | Status: DC | PRN

## 2019-05-21 MED ORDER — EPINEPHRINE 1 MG/10ML IJ SOSY
PREFILLED_SYRINGE | INTRAMUSCULAR | Status: AC
  Filled 2019-05-21: qty 10

## 2019-05-21 MED ORDER — PRO-STAT SUGAR FREE PO LIQD
30.0000 mL | Freq: Three times a day (TID) | ORAL | Status: DC
  Administered 2019-05-21 – 2019-05-26 (×15): 30 mL
  Filled 2019-05-21 (×14): qty 30

## 2019-05-21 MED ORDER — ATORVASTATIN CALCIUM 10 MG PO TABS
10.0000 mg | ORAL_TABLET | Freq: Every day | ORAL | Status: DC
  Administered 2019-05-21 – 2019-05-25 (×5): 10 mg
  Filled 2019-05-21 (×5): qty 1

## 2019-05-21 MED ORDER — MIDAZOLAM HCL 2 MG/2ML IJ SOLN
2.0000 mg | INTRAMUSCULAR | Status: AC
  Administered 2019-05-21 (×2): 2 mg via INTRAVENOUS

## 2019-05-21 MED ORDER — ATORVASTATIN CALCIUM 10 MG PO TABS: 10.0000 mg | ORAL_TABLET | Freq: Every day | ORAL | Status: DC

## 2019-05-21 NOTE — Progress Notes (Signed)
SLP Cancellation Note  Patient Details Name: Justin Valencia MRN: [id number] DOB: [birth year]   Cancelled treatment:       Reason Eval/Treat Not Completed: Medical issues which prohibited therapy - pt was intubated on previous date. SLP recommendation prior to intubation had been NPO due to concern for aspiration. Recommend re-ordering SLP once able to be extubated for repeat swallow evaluation.    [name] 05/21/2019, 11:19 AM  [name], M.A. [place] Acute Environmental education officer [phone] Office [phone]

## 2019-05-21 NOTE — CV Procedure (Signed)
Radial arterial line placement.    Emergent consent assumed. The right wrist was prepped and draped in the routine sterile fashion a single lumen radial arterial catheter was placed in the right radial artery using a modified Seldinger technique. Good blood flow and wave forms. A dressing was placed.     Glori Bickers, MD  12:15 PM

## 2019-05-21 NOTE — CV Procedure (Signed)
Central Venous Catheter Insertion Procedure Note Justin Valencia [id number] [birth year]    Procedure: Insertion of Central Venous Catheter Indications: Drug and/or fluid administration   Procedure Details Consent: Risks of procedure as well as the alternatives and risks of each were explained to the (patient/caregiver).  Consent for procedure obtained. Time Out: Verified patient identification, verified procedure, site/side was marked, verified correct patient position, special equipment/implants available, medications/allergies/relevent history reviewed, required imaging and test results available.  Performed   Maximum sterile technique was used including antiseptics, cap, gloves, gown, hand hygiene, mask and sheet. Skin prep: Chlorhexidine; local anesthetic administered A antimicrobial bonded/coated triple lumen catheter was placed in the left internal jugular vein using the Seldinger technique and u/s guidance.   Evaluation Blood flow good Complications: No apparent complications Patient did tolerate procedure well. Chest X-ray ordered to verify placement.  CXR: pending   Glori Bickers, MD  12:14 PM

## 2019-05-21 NOTE — Progress Notes (Signed)
  Echocardiogram Echocardiogram Transesophageal has been performed.  Justin Valencia 05/21/2019, 11:52 AM

## 2019-05-21 NOTE — Progress Notes (Signed)
NAME:  Justin Valencia, MRN:  [id number], DOB:  08/15/1941, LOS: 0 ADMISSION DATE:  03-Jun-2019, CONSULTATION DATE:  06/03/2019 REFERRING MD:  [facility], CHIEF COMPLAINT:  Hypotension   HPI  78 y/o M admitted 9/11 with several days of progressive weakness and a fall at home.  On admit, he was dyspneic and hypotensive.  Elevated troponin & ECHO with new evidence of decreased EF of 20-25%.  PA catheter placed with parameters consistent with profound cardiogenic shock.  Blood cultures with 2/2 enterococcus.    Past Medical History  HTN HLD CAD - s/p CABG 2006 with AVR (bovine) and ASD closure  Mitral Regurgitation  Bilateral carotid artery disease  AF - on amiodarone + coumadin  DM II   Cultures  COVID 9/11 >> negative  RVP 9/12 >> negative  BCID 9/12 >> enterococcus species UC 9/12 >> negative  BCx2 9/12 >> enterococcus faecalis >> pan-sensitive  BCx2 9/13 >>   Antibiotics  Cefepime 9/12 >> 9/13  Vanco 9/12 >> 9/14  Ampicillin 9/14 >>  Rocephin 9/12, 9/14 >>   Lines/Tubes  R IJ Cordis 9/12 >>  ETT 9/15 >>   Events  9/11 Admit  9/12 PA cath with profound cardiogenic shock, BC with enterococcus, AFwRVR, 6L 9/14 PCCM s/o.  PA 52/23, CVP 8, SVR 1149, PCWP 23 9/15 PCCM called back for respiratory insufficiency, intubated   Significant Diagnostic Tests  ECHO 9/12 >> LVEF 20-25%, LVEDP ~ 41mmHg, LAP ~ 40 CT Head 9/13 >> acute or subacute infarct in the R frontoparietal junction CTA Head/Neck 9/14 >> no emergent LVO, mild proximal L PCA stenosis, moderate stenosis of the proximal internal carotid arteries bilaterally, intramural hematoma over the distal aortic arch, bilateral upper lobe airspace disease with effusions   Interim history/subjective:  Now intubated full mechanical ventilatory support shock requiring Levophed atrial fibrillation with a heart rate 130.  Objective   BP (P) 99/81 (BP Location: Right Arm)   Pulse (!) (P) 115   Temp 98.7 F (37.1 C)  (Axillary)   Resp 16   Ht 5\' 11"  (1.803 m)   Wt 94.7 kg   SpO2 100%   BMI 29.12 kg/m    Intake/Output Summary (Last 24 hours) at 05/21/2019 0828 Last data filed at 05/21/2019 0600 Gross per 24 hour  Intake 2199.8 ml  Output 1230 ml  Net 969.8 ml    Filed Weights   05/19/19 0421 05/20/19 0435 05/21/19 0500  Weight: 95.3 kg 92.5 kg 94.7 kg    Examination: General: Well-nourished well-developed male currently sedated on full mechanical ventilatory support HEENT: Endotracheal tube is in place gastric tube is in place right IJ central line is in place Neuro: Despite sedation follows commands by squeezing bilaterally wiggling toes bilaterally CV: Heart sounds are irregular, heart rate ranges from 120-145 currently is on amiodarone drip plus Levophed drip PULM: Coarse rhonchi bilaterally GI: soft, bsx4 active  Extremities: warm/dry, plus edema  Skin: no rashes or lesions   Labs   Recent Labs  Lab 05/19/19 0430  05/20/19 0443 05/20/19 1225 05/20/19 1703 05/21/19 0342  HGB 11.3*   < > 11.2* 10.9* 11.2* 10.6*  HCT 32.2*   < > 31.3* 32.0* 33.0* 29.8*  WBC 8.2  --  9.1  --   --  10.0  PLT 56*  --  97*  --   --  122*   < > = values in this interval not displayed.   Recent Labs  Lab 06/03/19 2018  05/17/19 0802  05/18/19 0615  05/19/19 0430 05/19/19 0440 05/20/19 0443 05/20/19 1225 05/20/19 1703 05/21/19 0342  NA 132*   < > 136   < > 138   < > 139 140 144 144 144 144  K 3.1*   < > 3.3*   < > 3.4*   < > 3.5 3.7 3.2* 3.6 3.9 3.3*  CL 102   < > 106  --  108  --  104  --  108  --   --  108  CO2 16*  --  15*  --  17*  --  22  --  26  --   --  24  GLUCOSE 145*   < > 104*  --  196*  --  205*  --  171*  --   --  170*  BUN 58*   < > 63*  --  63*  --  52*  --  44*  --   --  47*  CREATININE 2.14*   < > 1.90*  --  1.33*  --  1.09  --  1.09  --   --  1.29*  CALCIUM 9.0  --  8.8*  --  8.2*  --  7.8*  --  8.1*  --   --  8.3*  MG 2.4  --  2.3  --  2.5*  --   --   --   --   --   --   2.2   < > = values in this interval not displayed.    Assessment & Plan:   Cardiogenic Shock with questionable bacteremia and sepsis Acute Systolic CHF / Concern for Infective Endocarditis LBBB Aortic Stenosis s/p Bovine AVR, MR CAD s/p CABG, Carotid Artery Disease Intramural Hematoma over Distal Aortic Arch -concern for possible AV insufficiency with enterococcus bacteremia and CVA P: Continue milrinone and Levophed per heart failure team Currently on Levophed consideration should be given to Neo-Synephrine due to atrial failure with a heart rate of 133 Follow lactate, SvO2, CVP  continue to monitor per cardiology  Enterococcus Bacteremia P: Continue antibiotics ID following  Subacute R Frontal Parietal CVA -CTA head neg for LVO P: Stroke team input appreciated  AFwRVR P: Amiodarone gtt per Cardiology  Tele monitoring   Vent dependent respiratory failure in the setting of bilateral airspace disease coupled with bilateral pleural effusions.  -RVP negative  P: Vent bundle Antimicrobial therapy Currently on ampicillin and Rocephin been started on 05/19/2019 for enterococcus bacteremia    Thrombocytopenia 97->122 -suspect related to valvular disease + enterococcus bacteremia  P: Continue to monitor note rise  AKI Hypokalemia  Recent Labs  Lab 05/20/19 1225 05/20/19 1703 05/21/19 0342  K 3.6 3.9 3.3*   Lab Results  Component Value Date   CREATININE 1.29 (H) 05/21/2019   CREATININE 1.09 05/20/2019   CREATININE 1.09 05/19/2019    P: Potassium repleted 05/20/2019 currently being aggressively diuresed per cardiology We will leave potassium repletion to cardiology at this time Avoid nephrotoxins noticed bump in creatinine  DM CBG (last 3)  Recent Labs    05/21/19 0010 05/21/19 0406 05/21/19 0814  GLUCAP 164* 151* 173*    P:  Sliding scale insulin protocol  At Risk Malnutrition  P:  Nutrition consult ordered 05/21/2019   Best Practice  SUP:  Pepcid  DVT:  Lovenox  PAD/Sedation: Fentanyl gtt  Disposition: ICU Family Communication: Updated as per cardiology  App CC Time: 45 min    Brett Canales Ezekeil Bethel ACNP  [facility] Pager [phone] till 1 pm If no answer page [phone] 05/21/2019, 8:16 AM

## 2019-05-21 NOTE — Progress Notes (Signed)
STROKE TEAM PROGRESS NOTE   INTERVAL HISTORY He was intubated y`day and is sedated. Plan for TEE today. Wife at bedside  Vitals:   05/21/19 1400 05/21/19 1500 05/21/19 1503 05/21/19 1600  BP: 115/62 107/66 134/63 (!) 98/51  Pulse:   83 82  Resp: 16 16 16 16   Temp:      TempSrc:      SpO2: 96% 97% 100% 96%  Weight:      Height:        CBC:  Recent Labs  Lab 05/23/2019 2018  05/20/19 0443  05/20/19 1703 05/21/19 0342  WBC 7.7   < > 9.1  --   --  10.0  NEUTROABS 6.3  --   --   --   --   --   HGB 11.7*   < > 11.2*   < > 11.2* 10.6*  HCT 34.8*   < > 31.3*   < > 33.0* 29.8*  MCV 103.0*   < > 101.0*  --   --  101.4*  PLT 67*   < > 97*  --   --  122*   < > = values in this interval not displayed.    Basic Metabolic Panel:  Recent Labs  Lab 05/18/19 0615  05/21/19 0342 05/21/19 1500  NA 138   < > 144 145  K 3.4*   < > 3.3* 3.7  CL 108   < > 108 111  CO2 17*   < > 24 25  GLUCOSE 196*   < > 170* 168*  BUN 63*   < > 47* 46*  CREATININE 1.33*   < > 1.29* 1.33*  CALCIUM 8.2*   < > 8.3* 8.3*  MG 2.5*  --  2.2  --    < > = values in this interval not displayed.   Lipid Panel:     Component Value Date/Time   TRIG 146 05/21/2019 0411   HgbA1c:  Lab Results  Component Value Date   HGBA1C 5.9 (H) 05/17/2019   Urine Drug Screen:     Component Value Date/Time   LABOPIA NONE DETECTED 05/17/2019 0336   COCAINSCRNUR NONE DETECTED 05/17/2019 0336   LABBENZ NONE DETECTED 05/17/2019 0336   AMPHETMU NONE DETECTED 05/17/2019 0336   THCU NONE DETECTED 05/17/2019 0336   LABBARB NONE DETECTED 05/17/2019 0336    Alcohol Level     Component Value Date/Time   ETH <10 05/17/2019 0246    IMAGING Dg Chest Port 1 View  Result Date: 05/21/2019 CLINICAL DATA:  Central line placement EXAM: PORTABLE CHEST 1 VIEW COMPARISON:  Same-day radiograph FINDINGS: Interval placement of left internal jugular approach central venous catheter with distal tip terminating the level of the distal  SVC. Stable positioning of right-sided central venous catheter. Endotracheal tube terminates approximately 3.7 cm superior to the carina. Enteric tube terminates within the stomach. Median sternotomy wires. Stable cardiomediastinal silhouette. Patchy bibasilar airspace opacities with small bilateral pleural effusions, not significantly changed. No pneumothorax. IMPRESSION: 1. Interval left IJ central venous catheter.  No pneumothorax. 2. Remaining lines and tubes in stable positioning. 3. Persistent bilateral airspace opacities and pleural effusions. Electronically Signed   By: Duanne Guess M.D.   On: 05/21/2019 12:55   Dg Chest Port 1 View  Result Date: 05/21/2019 CLINICAL DATA:  Endotracheal tube placement.  Respiratory failure. EXAM: PORTABLE CHEST 1 VIEW COMPARISON:  05/20/2019 FINDINGS: The endotracheal tube terminates above the carina. There is a right-sided central venous catheter with stable positioning.  The enteric tube appears to extend below the left hemidiaphragm. The heart is enlarged. There are small to moderate-sized bilateral pleural effusions, right greater than left with presumed adjacent atelectasis. There are findings of pulmonary edema. IMPRESSION: 1. Lines and tubes as above. 2. Persistent pulmonary edema with small to moderate-sized bilateral pleural effusions. Electronically Signed   By: Katherine Mantlehristopher  Green M.D.   On: 05/21/2019 08:39   Dg Chest Port 1 View  Result Date: 05/20/2019 CLINICAL DATA:  Status post EG tube and OG tube placement. EXAM: PORTABLE CHEST 1 VIEW COMPARISON:  Single-view of the chest 05/19/2019. FINDINGS: New endotracheal tube is in place with the tip in good position at the level of clavicular heads approximately 3 cm above the carina. OG tube courses into the stomach and below the inferior margin of the film. Swan-Ganz catheter has been removed. Right IJ sheath remains in place. There is cardiomegaly and bilateral airspace disease most consistent with  congestive failure. Small to moderate bilateral pleural effusions are larger on the left. Aeration is mildly improved with better expansion of the chest today. No pneumothorax. IMPRESSION: ET tube projects in good position approximately 3 cm above the carina. OG tube courses into the stomach and below the inferior margin the film. Bilateral airspace disease and effusions most consistent with congestive heart failure. Aeration appears mildly improved compared to yesterday's exam Electronically Signed   By: Drusilla Kannerhomas  Dalessio M.D.   On: 05/20/2019 16:02    PHYSICAL EXAM Frail elderly Caucasian male who is intubated and sedated.. . Afebrile. Head is nontraumatic. Neck is supple without bruit.    Cardiac exam no murmur or gallop. Lungs are clear to auscultation. Distal pulses are well felt. Neurological Exam :   patient is sedated intubated . Not following any commands. Dolls eye movements are sluggish.   He has left lower facial weakness.  Tongue midline.  Motor system exam shows brisk withdrawal in the right upper and modest withdrawal in the left upper extremity to painful stimuli.  Trace withdrawal in both lower extremities right greater than left..  Deep tendon reflexes symmetric.  Sensation is intact bilaterally.  Plantars are downgoing.  Gait not tested.   ASSESSMENT/PLAN Mr. Justin Valencia is a 78 y.o. male with history of aortic valve replacement, numbness tingling in hands, mitral valve regurgitation, hypertension, hyperlipidemia, gait disorder, cerebrovascular disease, carotid artery stenosis, atrial fibrillation presenting after fall at home with progressive weakness. Found to be hypotensive w/ low EF with enterococcus bacteremia. CT showed infarct.  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  Patient is also subsequently developed atrial fibrillation during the hospitalization which also puts him at risk for further  stroke  CT head acute/subacute R frontal parietal jxn infarct. Small vessel disease.   CTA head & neck no ELVO. Extensive atherosclerosis. High grade stenosis B cavernous and mod at prox B ICAs. Mild prox L PCA stenosis. Mod stenosis R VA. Intramural hematoma over distal aortic artch. BUL airspace dz w/ B effusions.   CT head w/w/o  And Ct angio brain pending  2D Echo TAVR w/ concern for severe prosthetic valve stenosis. EF 20-25%. atrial fibrillation.   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 BP Low on arrival and remains low  Hyperlipidemia  Home meds:  lipitor 10 and lopid 600  LDL pending, goal < 70  Continue statin at discharge  Diabetes type II Controlled  HgbA1c 5.9, goal < 7.0  Other Stroke Risk Factors  Advanced age  Former Cigarette smoker, quit 14 yrs ago  Overweight, Body mass index is 29.12 kg/m., recommend weight loss, diet and exercise as appropriate   Coronary artery disease s/p CABG  B carotid disease  Aortic stenosis w/ AV replacement 2006  ASD w/ closure 2006  Hx MV endocartidits 2013  TAVR 5102  Acute systolic HF  Other Active Problems  AKI  Hypokalemia 3.2  Thrombocytopenia 56-97  LBBB  Hospital day # 4  Recommend check MRI scan of the brain with and without contrast now that the patient is intubated.  Check TEE for endocarditis.  If MRI shows bland infarcts and TEE shows no evidence of vegetations may consider starting anticoagulation given atrial fibrillation.  I had a long discussion with  his wife at  the bedside and answered questions about his care.  Continue aspirin for now.  This patient is critically ill and at significant risk of neurological worsening, death and care requires constant monitoring of vital signs, hemodynamics,respiratory and cardiac monitoring, extensive review of multiple databases, frequent neurological assessment, discussion with family, other specialists and medical decision making of high complexity.I have made any additions or clarifications directly to the above note.This critical care time does not reflect procedure time, or teaching time or supervisory time of PA/NP/Med Resident etc but could involve care discussion time.  I spent 30 minutes of neurocritical care time  in the care of  this patient.      Antony Contras, MD Medical Director [facility] Pager: [phone] 05/21/2019 4:53 PM   To contact Stroke Continuity provider, please refer to [email or web address] After hours, contact General Neurology

## 2019-05-21 NOTE — CV Procedure (Signed)
    TRANSESOPHAGEAL ECHOCARDIOGRAM   NAME:  Justin Valencia   MRN: [id number] DOB:  [birth year]   ADMIT DATE: May 24, 2019  INDICATIONS:  Bacteremia  PROCEDURE:   Informed consent was obtained prior to the procedure from the patient's wife.. The risks, benefits and alternatives for the procedure were discussed and the patient comprehended these risks.  Risks include, but are not limited to, cough, sore throat, vomiting, nausea, somnolence, esophageal and stomach trauma or perforation, bleeding, low blood pressure, aspiration, pneumonia, infection, trauma to the teeth and death.    The patient was already intubated. He was further sedated with versed and fentanyl/ A time-out was taken.  The transesophageal probe was inserted in the esophagus and stomach without difficulty and multiple views were obtained.    COMPLICATIONS:    There were no immediate complications.  FINDINGS:  LEFT VENTRICLE: EF = 25%. No regional wall motion abnormalities.  RIGHT VENTRICLE: Severely HK  LEFT ATRIUM: Severely dilated. + smoke  LEFT ATRIAL APPENDAGE: Not well visualized  RIGHT ATRIUM: Dialted  AORTIC VALVE:  Bioprosthetic. Moderately calcified. Mild reduction in leaflet movement. No vegetation.   MITRAL VALVE:    Normal. Severely thickened and calcified. Mild MR. Severe MS with mean gradient 11-12mm HG. No evidence of vegetation   TRICUSPID VALVE: Normal. Mild TR  PULMONIC VALVE: Grossly normal. Trivial TR  INTERATRIAL SEPTUM: No PFO or ASD. + bulges to R  PERICARDIUM: No effusion  DESCENDING AORTA: Severe plaque with evidence of some ulcerated areas   CONCLUSION:  No clear TEE evidence of endocarditis. Severe aortic plaquing.  Daniel Bensimhon,MD 12:16 PM

## 2019-05-21 NOTE — Progress Notes (Signed)
Initial Nutrition Assessment  DOCUMENTATION CODES:   Not applicable  INTERVENTION:   Tube feeding:  -Vital AF 1.2 @ 55 ml/hr via OGT -30 ml Prostat TID  Provides: 1884 kcals, 144 grams protein, 1071 ml free water. Meets 100% of needs.   NUTRITION DIAGNOSIS:   Increased nutrient needs related to acute illness as evidenced by estimated needs.  GOAL:   Provide needs based on ASPEN/SCCM guidelines  MONITOR:   Diet advancement, Vent status, Labs, I & O's, TF tolerance, Skin, Weight trends  REASON FOR ASSESSMENT:   Ventilator, Consult Enteral/tube feeding initiation and management  ASSESSMENT:   Patient with PMH significant for aortic stenosis s/p AVR, CAD s/p CABG, DM, HLD, HTN, endocarditis, prior CVA, and nephrolithiasis. Presents this admission with new onset heart failure and pleural effusion.   9/15- intubated   Pt discussed during ICU rounds and with RN.   Pt with left sided weakness and found to have R embolic stroke. Continues on pressor support. Unable to follow commands this am.   Obtained history from wife. Denies loss of appetite-typically eats 2-3 meals daily with good protein sources. Denies unintentional wt loss PTA. Unsure of UBW.   Chart records limited over the last year. Admission weight: 91.6 kg Current weight: 94.7 kg   Patient is currently intubated on ventilator support MV: 10.4 L/min Temp (24hrs), Avg:98.5 F (36.9 C), Min:98.1 F (36.7 C), Max:98.8 F (37.1 C)   I/O: +5,260 ml since admit UOP: 1,355 ml x 24 hrs    Drips: NS @ 20 ml/hr, amiodarone, milrinone, levophed Medications: SS novolog Labs: K 3.3 (L) CBG 151-205   NUTRITION - FOCUSED PHYSICAL EXAM:    Most Recent Value  Orbital Region  No depletion  Upper Arm Region  No depletion  Thoracic and Lumbar Region  Unable to assess  Buccal Region  No depletion  Temple Region  Mild depletion  Clavicle Bone Region  No depletion  Clavicle and Acromion Bone Region  No depletion   Scapular Bone Region  Unable to assess  Dorsal Hand  No depletion  Patellar Region  No depletion  Anterior Thigh Region  No depletion  Posterior Calf Region  No depletion  Edema (RD Assessment)  None  Hair  Reviewed  Eyes  Reviewed  Mouth  Reviewed  Skin  Reviewed  Nails  Reviewed     Diet Order:   Diet Order            Diet NPO time specified  Diet effective now              EDUCATION NEEDS:   Not appropriate for education at this time  Skin:  Skin Assessment: Skin Integrity Issues: Skin Integrity Issues:: Incisions Incisions: arm  Last BM:  9/14  Height:   Ht Readings from Last 1 Encounters:  05/17/19 5\' 11"  (1.803 m)    Weight:   Wt Readings from Last 1 Encounters:  05/21/19 94.7 kg    Ideal Body Weight:  78.2 kg  BMI:  Body mass index is 29.12 kg/m.  Estimated Nutritional Needs:   Kcal:  1879 kcal  Protein:  130-150 gram  Fluid:  >/= 1.8 L/day   Mariana Single RD, LDN Clinical Nutrition Pager # - [phone]

## 2019-05-21 NOTE — Progress Notes (Signed)
[facility] ADULT ICU REPLACEMENT PROTOCOL FOR AM LAB REPLACEMENT ONLY  The patient does apply for the [facility] Adult ICU Electrolyte Replacment Protocol based on the criteria listed below:   1. Is GFR >/= 40 ml/min? Yes.    Patient's GFR today is 54 2. Is urine output >/= 0.5 ml/kg/hr for the last 6 hours? Yes.   Patient's UOP is 1.1 ml/kg/hr 3. Is BUN < 60 mg/dL? Yes.    Patient's BUN today is 47 4. Abnormal electrolyte(s): K-33.3 5. Ordered repletion with: per protocol 6. If a panic level lab has been reported, has the CCM MD in charge been notified? Yes.  .   Physician:  Dr Terrill Mohr, Philis Nettle 05/21/2019 6:19 AM

## 2019-05-21 NOTE — Progress Notes (Signed)
[facility]   Reason for visit: Follow up on Enterococcal bacteremia  Interval History: remains intubated, on pressor support.  TEE scheduled for today.  Repeat blood cultures from 9/13 remain ngtd x 2 sets.   Day 5 total antibiotics Day 3 amp/ceftriaxone dual therapy   Physical Exam: Constitutional:  Vitals:   05/21/19 0800 05/21/19 0900  BP: 99/81 103/70  Pulse: (!) 115   Resp: 16 20  Temp: 98.7 F (37.1 C)   SpO2: 100% 97%   patient is intubated Eyes: anicteric HENT:+ET Respiratory: respiratory effort on vent; CTA B, anterior exam Cardiovascular: tachy RR GI: soft  Review of Systems: Unable to be assessed due to patient factors  Lab Results  Component Value Date   WBC 10.0 05/21/2019   HGB 10.6 (L) 05/21/2019   HCT 29.8 (L) 05/21/2019   MCV 101.4 (H) 05/21/2019   PLT 122 (L) 05/21/2019    Lab Results  Component Value Date   CREATININE 1.29 (H) 05/21/2019   BUN 47 (H) 05/21/2019   NA 144 05/21/2019   K 3.3 (L) 05/21/2019   CL 108 05/21/2019   CO2 24 05/21/2019    Lab Results  Component Value Date   ALT 57 (H) 05/20/2019   AST 58 (H) 05/20/2019   ALKPHOS 64 05/20/2019     Microbiology: Recent Results (from the past 240 hour(s))  SARS Coronavirus 2 [facility](Hospital order, Performed in [facility] hospital lab) Nasopharyngeal Nasopharyngeal Swab     Status: None   Collection Time: 12/27/2018  8:20 PM   Specimen: Nasopharyngeal Swab  Result Value Ref Range Status   SARS Coronavirus 2 NEGATIVE NEGATIVE Final    Comment: (NOTE) If result is NEGATIVE SARS-CoV-2 target nucleic acids are NOT DETECTED. The SARS-CoV-2 RNA is generally detectable in upper and lower  respiratory specimens during the acute phase of infection. The lowest  concentration of SARS-CoV-2 viral copies this assay can detect is 250  copies / mL. A negative result does not preclude SARS-CoV-2 infection  and should not be used as the sole basis for treatment or other   patient management decisions.  A negative result may occur with  improper specimen collection / handling, submission of specimen other  than nasopharyngeal swab, presence of viral mutation(s) within the  areas targeted by this assay, and inadequate number of viral copies  (<250 copies / mL). A negative result must be combined with clinical  observations, patient history, and epidemiological information. If result is POSITIVE SARS-CoV-2 target nucleic acids are DETECTED. The SARS-CoV-2 RNA is generally detectable in upper and lower  respiratory specimens dur ing the acute phase of infection.  Positive  results are indicative of active infection with SARS-CoV-2.  Clinical  correlation with patient history and other diagnostic information is  necessary to determine patient infection status.  Positive results do  not rule out bacterial infection or co-infection with other viruses. If result is PRESUMPTIVE POSTIVE SARS-CoV-2 nucleic acids MAY BE PRESENT.   A presumptive positive result was obtained on the submitted specimen  and confirmed on repeat testing.  While 2019 novel coronavirus  (SARS-CoV-2) nucleic acids may be present in the submitted sample  additional confirmatory testing may be necessary for epidemiological  and / or clinical management purposes  to differentiate between  SARS-CoV-2 and other Sarbecovirus currently known to infect humans.  If clinically indicated additional testing with an alternate test  methodology [phone] is advised. The SARS-CoV-2 RNA is generally  detectable in upper  and lower respiratory sp ecimens during the acute  phase of infection. The expected result is Negative. Fact Sheet for Patients:  BoilerBrush.com.cy Fact Sheet for Healthcare Providers: https://pope.com/ This test is not yet approved or cleared by the [place] FDA and has been authorized for detection and/or diagnosis of SARS-CoV-2 by  FDA under an Emergency Use Authorization (EUA).  This EUA will remain in effect (meaning this test can be used) for the duration of the COVID-19 declaration under Section 564(b)(1) of the Act, 21 U.S.C. section 360bbb-3(b)(1), unless the authorization is terminated or revoked sooner. Performed at [facility], [address], [place], [place] [address]   Respiratory Panel by PCR     Status: None   Collection Time: 05/17/19  2:03 AM   Specimen: Respiratory  Result Value Ref Range Status   Adenovirus NOT DETECTED NOT DETECTED Final   Coronavirus 229E NOT DETECTED NOT DETECTED Final    Comment: (NOTE) The Coronavirus on the Respiratory Panel, DOES NOT test for the novel  Coronavirus (2019 nCoV)    Coronavirus HKU1 NOT DETECTED NOT DETECTED Final   Coronavirus NL63 NOT DETECTED NOT DETECTED Final   Coronavirus OC43 NOT DETECTED NOT DETECTED Final   Metapneumovirus NOT DETECTED NOT DETECTED Final   Rhinovirus / Enterovirus NOT DETECTED NOT DETECTED Final   Influenza A NOT DETECTED NOT DETECTED Final   Influenza B NOT DETECTED NOT DETECTED Final   Parainfluenza Virus 1 NOT DETECTED NOT DETECTED Final   Parainfluenza Virus 2 NOT DETECTED NOT DETECTED Final   Parainfluenza Virus 3 NOT DETECTED NOT DETECTED Final   Parainfluenza Virus 4 NOT DETECTED NOT DETECTED Final   Respiratory Syncytial Virus NOT DETECTED NOT DETECTED Final   Bordetella pertussis NOT DETECTED NOT DETECTED Final   Chlamydophila pneumoniae NOT DETECTED NOT DETECTED Final   Mycoplasma pneumoniae NOT DETECTED NOT DETECTED Final    Comment: Performed at [facility], [address], [place], [place] [address]  MRSA PCR Screening     Status: None   Collection Time: 05/17/19  2:03 AM   Specimen: Nasopharyngeal  Result Value Ref Range Status   MRSA by PCR NEGATIVE NEGATIVE Final    Comment:        The GeneXpert MRSA Assay (FDA approved for NASAL specimens only), is one component of a comprehensive MRSA  colonization surveillance program. It is not intended to diagnose MRSA infection nor to guide or monitor treatment for MRSA infections. Performed at [facility], [address], [place], [place] [address]   Culture, blood (routine x 2)     Status: Abnormal   Collection Time: 05/17/19  2:40 AM   Specimen: BLOOD LEFT HAND  Result Value Ref Range Status   Specimen Description BLOOD LEFT HAND  Final   Special Requests   Final    BOTTLES DRAWN AEROBIC ONLY Blood Culture results may not be optimal due to an inadequate volume of blood received in culture bottles   Culture  Setup Time   Final    GRAM POSITIVE COCCI IN PAIRS AND CHAINS AEROBIC BOTTLE ONLY CRITICAL RESULT CALLED TO, READ BACK BY AND VERIFIED WITH: A. MEYER, PHARMD AT 1935 ON 05/17/19 BY C. JESSUP, MT.    Culture (A)  Final    ENTEROCOCCUS FAECALIS SUSCEPTIBILITIES PERFORMED ON PREVIOUS CULTURE WITHIN THE LAST 5 DAYS. Performed at [facility], [address] [place], [place] [address]    Report Status 05/19/2019 FINAL  Final  Culture, blood (  routine x 2)     Status: Abnormal   Collection Time: 05/17/19  2:44 AM   Specimen: BLOOD RIGHT HAND  Result Value Ref Range Status   Specimen Description BLOOD RIGHT HAND  Final   Special Requests   Final    BOTTLES DRAWN AEROBIC ONLY Blood Culture results may not be optimal due to an inadequate volume of blood received in culture bottles   Culture  Setup Time   Final    GRAM POSITIVE COCCI IN PAIRS AND CHAINS AEROBIC BOTTLE ONLY CRITICAL RESULT CALLED TO, READ BACK BY AND VERIFIED WITH: A. MEYER, PHARMD AT 1935 ON 05/17/19 BY C. JESSUP, MT. Performed at [facility], [place] [address] [place], [place] [address]    Culture ENTEROCOCCUS FAECALIS (A)  Final   Report Status 05/19/2019 FINAL  Final   Organism ID, Bacteria ENTEROCOCCUS FAECALIS  Final      Susceptibility   Enterococcus faecalis - MIC*    AMPICILLIN <=2 SENSITIVE Sensitive     VANCOMYCIN 2 SENSITIVE  Sensitive     GENTAMICIN SYNERGY SENSITIVE Sensitive     * ENTEROCOCCUS FAECALIS  Blood Culture ID Panel (Reflexed)     Status: Abnormal   Collection Time: 05/17/19  2:44 AM  Result Value Ref Range Status   Enterococcus species DETECTED (A) NOT DETECTED Final    Comment: CRITICAL RESULT CALLED TO, READ BACK BY AND VERIFIED WITH: A. MEYER, PHARMD AT 1935 ON 05/17/19 BY C. JESSUP, MT.    Vancomycin resistance NOT DETECTED NOT DETECTED Final   Listeria monocytogenes NOT DETECTED NOT DETECTED Final   Staphylococcus species NOT DETECTED NOT DETECTED Final   Staphylococcus aureus (BCID) NOT DETECTED NOT DETECTED Final   Streptococcus species NOT DETECTED NOT DETECTED Final   Streptococcus agalactiae NOT DETECTED NOT DETECTED Final   Streptococcus pneumoniae NOT DETECTED NOT DETECTED Final   Streptococcus pyogenes NOT DETECTED NOT DETECTED Final   Acinetobacter baumannii NOT DETECTED NOT DETECTED Final   Enterobacteriaceae species NOT DETECTED NOT DETECTED Final   Enterobacter cloacae complex NOT DETECTED NOT DETECTED Final   Escherichia coli NOT DETECTED NOT DETECTED Final   Klebsiella oxytoca NOT DETECTED NOT DETECTED Final   Klebsiella pneumoniae NOT DETECTED NOT DETECTED Final   Proteus species NOT DETECTED NOT DETECTED Final   Serratia marcescens NOT DETECTED NOT DETECTED Final   Haemophilus influenzae NOT DETECTED NOT DETECTED Final   Neisseria meningitidis NOT DETECTED NOT DETECTED Final   Pseudomonas aeruginosa NOT DETECTED NOT DETECTED Final   Candida albicans NOT DETECTED NOT DETECTED Final   Candida glabrata NOT DETECTED NOT DETECTED Final   Candida krusei NOT DETECTED NOT DETECTED Final   Candida parapsilosis NOT DETECTED NOT DETECTED Final   Candida tropicalis NOT DETECTED NOT DETECTED Final    Comment: Performed at [facility], [address] [place], [place] [address]  Culture, Urine     Status: None   Collection Time: 05/17/19  3:37 AM   Specimen: Urine,  Catheterized  Result Value Ref Range Status   Specimen Description URINE, CATHETERIZED  Final   Special Requests NONE  Final   Culture   Final    NO GROWTH Performed at [place] [address], [place], [place] [address]    Report Status 05/18/2019 FINAL  Final  Culture, blood (routine x 2)     Status: None (Preliminary result)   Collection Time: 05/18/19  4:08 PM   Specimen: BLOOD  Result Value Ref Range Status  Specimen Description BLOOD BLOOD RIGHT WRIST  Final   Special Requests   Final    AEROBIC BOTTLE ONLY Blood Culture results may not be optimal due to an inadequate volume of blood received in culture bottles   Culture   Final    NO GROWTH 2 DAYS Performed at [facility], [address] [place], [place] [address]    Report Status PENDING  Incomplete  Culture, blood (routine x 2)     Status: None (Preliminary result)   Collection Time: 05/18/19  4:08 PM   Specimen: BLOOD  Result Value Ref Range Status   Specimen Description BLOOD BLOOD RIGHT HAND  Final   Special Requests AEROBIC BOTTLE ONLY Blood Culture adequate volume  Final   Culture   Final    NO GROWTH 2 DAYS Performed at [facility], [address] [place], [place] [address]    Report Status PENDING  Incomplete    Impression/Plan:  1. Enterococcal bacteremia - concern for endocarditis with history of TAVR and valve dysfunction.  TEE today. On dual beta lactam therapy for presumed endocarditis at this time with ampicillin + ceftriaxone.  Will continue.  2.  Renal insufficiency - up today from previous to 1.29.    3.  CNS emboli - I suspect infectious emboli.  On therapy as above.

## 2019-05-21 NOTE — Progress Notes (Signed)
Advanced Heart Failure Rounding Note   Subjective:    Swan placed on 9/12 with profound cardiogenic shock.   Intubated 9/15 for worsening respiratory distress.  TEE this am EF 25% with severe RV dysfunction. Severe MS. No obvious vegetation. Very heavy aortic plaquing  Will awaken on vent. Agitated at times. Milrinone stopped due to hypotension with TEE sedation. Now on NE 30-60mg  Repeat bcx remain negative.   Objective:   Weight Range:  Vital Signs:   Temp:  [98.2 F (36.8 C)-99.5 F (37.5 C)] 99.5 F (37.5 C) (09/16 2000) Pulse Rate:  [82-130] 82 (09/16 1600) Resp:  [15-27] 16 (09/16 2100) BP: (84-148)/(48-120) 113/69 (09/16 2100) SpO2:  [93 %-100 %] 94 % (09/16 2100) Arterial Line BP: (21-149)/(11-68) 124/54 (09/16 2100) FiO2 (%):  [40 %] 40 % (09/16 2001) Weight:  [94.7 kg] 94.7 kg (09/16 0500) Last BM Date: 05/19/19  Weight change: Filed Weights   05/19/19 0421 05/20/19 0435 05/21/19 0500  Weight: 95.3 kg 92.5 kg 94.7 kg    Intake/Output:   Intake/Output Summary (Last 24 hours) at 05/21/2019 2247 Last data filed at 05/21/2019 2100 Gross per 24 hour  Intake 2482.53 ml  Output 1425 ml  Net 1057.53 ml     Physical Exam: CVP 10-11 General:  Critically ill appearing. On vent HEENT: normal + ETT Neck: supple.RIJ introducer. LIJ TLC Cor: PMI nondisplaced. Regular rate & rhythm. 2.6 S Lungs: + crackles Abdomen: soft, nontender, nondistended. No hepatosplenomegaly. No bruits or masses. Good bowel sounds. Extremities: no cyanosis, clubbing, rash, traceedema Neuro: intubated. Will awaken at times   Telemetry: NSR 70-80s with PACs Personally reviewed    Labs: Basic Metabolic Panel: Recent Labs  Lab 05/25/2019 2018  05/17/19 0802  05/18/19 0615  05/19/19 0430  05/20/19 0443 05/20/19 1225 05/20/19 1703 05/21/19 0342 05/21/19 1500  NA 132*   < > 136   < > 138   < > 139   < > 144 144 144 144 145  K 3.1*   < > 3.3*   < > 3.4*   < > 3.5   < > 3.2*  3.6 3.9 3.3* 3.7  CL 102   < > 106  --  108  --  104  --  108  --   --  108 111  CO2 16*  --  15*  --  17*  --  22  --  26  --   --  24 25  GLUCOSE 145*   < > 104*  --  196*  --  205*  --  171*  --   --  170* 168*  BUN 58*   < > 63*  --  63*  --  52*  --  44*  --   --  47* 46*  CREATININE 2.14*   < > 1.90*  --  1.33*  --  1.09  --  1.09  --   --  1.29* 1.33*  CALCIUM 9.0  --  8.8*  --  8.2*  --  7.8*  --  8.1*  --   --  8.3* 8.3*  MG 2.4  --  2.3  --  2.5*  --   --   --   --   --   --  2.2  --    < > = values in this interval not displayed.    Liver Function Tests: Recent Labs  Lab 05/22/2019 2018 05/17/19 0802 05/18/19 0615 05/19/19 0430 05/20/19 04081  AST 155* 156* 138* 95* 58*  ALT 56* 65* 76* 69* 57*  ALKPHOS 58 56 66 53 64  BILITOT 1.4* 1.6* 1.3* 1.3* 1.0  PROT 6.6 6.4* 6.0* 5.9* 5.9*  ALBUMIN 3.1* 2.9* 2.5* 2.5* 2.4*   No results for input(s): LIPASE, AMYLASE in the last 168 hours. No results for input(s): AMMONIA in the last 168 hours.  CBC: Recent Labs  Lab 05/12/2019 2018  05/17/19 0802  05/18/19 0615  05/19/19 0430 05/19/19 0440 05/20/19 0443 05/20/19 1225 05/20/19 1703 05/21/19 0342  WBC 7.7  --  5.7  --  5.8  --  8.2  --  9.1  --   --  10.0  NEUTROABS 6.3  --   --   --   --   --   --   --   --   --   --   --   HGB 11.7*   < > 12.0*   < > 11.5*   < > 11.3* 11.2* 11.2* 10.9* 11.2* 10.6*  HCT 34.8*   < > 34.4*   < > 33.0*   < > 32.2* 33.0* 31.3* 32.0* 33.0* 29.8*  MCV 103.0*  --  99.7  --  98.8  --  99.7  --  101.0*  --   --  101.4*  PLT 67*   < > 62*  --  53*  --  56*  --  97*  --   --  122*   < > = values in this interval not displayed.    Cardiac Enzymes: No results for input(s): CKTOTAL, CKMB, CKMBINDEX, TROPONINI in the last 168 hours.  BNP: BNP (last 3 results) No results for input(s): BNP in the last 8760 hours.  ProBNP (last 3 results) No results for input(s): PROBNP in the last 8760 hours.    Other results:  Imaging: Dg Chest Port 1 View   Result Date: 05/21/2019 CLINICAL DATA:  Central line placement EXAM: PORTABLE CHEST 1 VIEW COMPARISON:  Same-day radiograph FINDINGS: Interval placement of left internal jugular approach central venous catheter with distal tip terminating the level of the distal SVC. Stable positioning of right-sided central venous catheter. Endotracheal tube terminates approximately 3.7 cm superior to the carina. Enteric tube terminates within the stomach. Median sternotomy wires. Stable cardiomediastinal silhouette. Patchy bibasilar airspace opacities with small bilateral pleural effusions, not significantly changed. No pneumothorax. IMPRESSION: 1. Interval left IJ central venous catheter.  No pneumothorax. 2. Remaining lines and tubes in stable positioning. 3. Persistent bilateral airspace opacities and pleural effusions. Electronically Signed   By: Davina Poke M.D.   On: 05/21/2019 12:55   Dg Chest Port 1 View  Result Date: 05/21/2019 CLINICAL DATA:  Endotracheal tube placement.  Respiratory failure. EXAM: PORTABLE CHEST 1 VIEW COMPARISON:  05/20/2019 FINDINGS: The endotracheal tube terminates above the carina. There is a right-sided central venous catheter with stable positioning. The enteric tube appears to extend below the left hemidiaphragm. The heart is enlarged. There are small to moderate-sized bilateral pleural effusions, right greater than left with presumed adjacent atelectasis. There are findings of pulmonary edema. IMPRESSION: 1. Lines and tubes as above. 2. Persistent pulmonary edema with small to moderate-sized bilateral pleural effusions. Electronically Signed   By: Constance Holster M.D.   On: 05/21/2019 08:39   Dg Chest Port 1 View  Result Date: 05/20/2019 CLINICAL DATA:  Status post EG tube and OG tube placement. EXAM: PORTABLE CHEST 1 VIEW COMPARISON:  Single-view of the chest 05/19/2019. FINDINGS: New endotracheal  tube is in place with the tip in good position at the level of clavicular heads  approximately 3 cm above the carina. OG tube courses into the stomach and below the inferior margin of the film. Swan-Ganz catheter has been removed. Right IJ sheath remains in place. There is cardiomegaly and bilateral airspace disease most consistent with congestive failure. Small to moderate bilateral pleural effusions are larger on the left. Aeration is mildly improved with better expansion of the chest today. No pneumothorax. IMPRESSION: ET tube projects in good position approximately 3 cm above the carina. OG tube courses into the stomach and below the inferior margin the film. Bilateral airspace disease and effusions most consistent with congestive heart failure. Aeration appears mildly improved compared to yesterday's exam Electronically Signed   By: Inge Rise M.D.   On: 05/20/2019 16:02     Medications:     Scheduled Medications: . aspirin  81 mg Per Tube Daily  . atorvastatin  10 mg Per Tube q1800  . chlorhexidine gluconate (MEDLINE KIT)  15 mL Mouth Rinse BID  . Chlorhexidine Gluconate Cloth  6 each Topical Daily  . enoxaparin (LOVENOX) injection  40 mg Subcutaneous Q24H  . EPINEPHrine      . feeding supplement (PRO-STAT SUGAR FREE 64)  30 mL Per Tube TID  . insulin aspart  0-9 Units Subcutaneous Q4H  . mouth rinse  15 mL Mouth Rinse 10 times per day  . pantoprazole (PROTONIX) IV  40 mg Intravenous Daily  . sodium chloride flush  10-40 mL Intracatheter Q12H    Infusions: . sodium chloride 10 mL/hr at 05/21/19 2100  . sodium chloride Stopped (05/19/19 1914)  . amiodarone 30 mg/hr (05/21/19 2100)  . ampicillin (OMNIPEN) IV Stopped (05/21/19 2043)  . cefTRIAXone (ROCEPHIN)  IV 2 g (05/21/19 2102)  . feeding supplement (VITAL AF 1.2 CAL) 55 mL/hr at 05/21/19 2100  . fentaNYL infusion INTRAVENOUS 175 mcg/hr (05/21/19 2100)  . milrinone 0.25 mcg/kg/min (05/21/19 2100)  . norepinephrine (LEVOPHED) Adult infusion 13 mcg/min (05/21/19 2100)    PRN Medications: sodium  chloride, acetaminophen, acetaminophen, fentaNYL, influenza vaccine adjuvanted, sennosides, sodium chloride flush   Assessment/Plan:   1. Shock - Suspect combination of cardiogenic and septic shock - EF newly down 55-60% in 7/20 -> 25% here. Unclear etiology. Trop moderately up but flat. Initial suspicion was for viral myocarditis but viral panel negative. - Initial swan numbers with low output and co-ox 44% - Bcx 2/2 enterococcus - Hemodynamics improved with inotrope support but co-ox 74% today. Milrinone had to be held with low BP during TEE. Continue NE - TEE confirms severe MS. No vegetation  - Continue abx and hemodynamic support.  - ID consult appreciated  2. Acute systolic HF - EF 16% see discussion above - Continue NE. Co-ox 74% - CVP 10-11. Diurese as needed  3. New onset AF with RVR  - Back in NSR today - continue amio  - Off heparin due to possibility of septic cerebral emboli and risk of hemorrhagic transformation  4. 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  - Doubt he is surgical candidate - head CT with new infarct -> ? Septic emboli.  - Neuro has seen (appreciate recs) -> brain MRI today  5. CAD s/p CABG -  trop up but doubt ACS as it is flat - will need coronary angio if creatinine permits  6. AKI - Due to ATN.  resolved  - avoid nephrotoxic agents  7. Acute hypoxic respiratory failure - intubated 9/16. CCM following (thanks)   8. DM2 - SSI  9 Aortic stenosis - s/p SAVR 2006 - s/p TAVR 2016   10. Thrombocytopenia - suspect related to sepsis +/- valvular disease - PLTs up to 122k  11. Prognosis - suspect prognosis is poor. Discussed with wife at bedside today  CRITICAL CARE Performed by: Glori Bickers  Total critical care time: 45 minutes  Critical  care time was exclusive of separately billable procedures and treating other patients.  Critical care was necessary to treat or prevent imminent or life-threatening deterioration.  Critical care was time spent personally by me (independent of midlevel providers or residents) on the following activities: development of treatment plan with patient and/or surrogate as well as nursing, discussions with consultants, evaluation of patient's response to treatment, examination of patient, obtaining history from patient or surrogate, ordering and performing treatments and interventions, ordering and review of laboratory studies, ordering and review of radiographic studies, pulse oximetry and re-evaluation of patient's condition.    Glori Bickers, MD  10:47 PM

## 2019-05-22 ENCOUNTER — Inpatient Hospital Stay (HOSPITAL_COMMUNITY): Payer: Medicare HMO

## 2019-05-22 DIAGNOSIS — I509 Heart failure, unspecified: Secondary | ICD-10-CM

## 2019-05-22 LAB — BASIC METABOLIC PANEL
Anion gap: 11 (ref 5–15)
Anion gap: 11 (ref 5–15)
BUN: 42 mg/dL — ABNORMAL HIGH (ref 8–23)
BUN: 38 mg/dL — ABNORMAL HIGH (ref 8–23)
CO2: 24 mmol/L (ref 22–32)
CO2: 24 mmol/L (ref 22–32)
Calcium: 8 mg/dL — ABNORMAL LOW (ref 8.9–10.3)
Calcium: 8.2 mg/dL — ABNORMAL LOW (ref 8.9–10.3)
Chloride: 112 mmol/L — ABNORMAL HIGH (ref 98–111)
Chloride: 111 mmol/L (ref 98–111)
Creatinine, Ser: 1.22 mg/dL (ref 0.61–1.24)
Creatinine, Ser: 1.39 mg/dL — ABNORMAL HIGH (ref 0.61–1.24)
GFR calc Af Amer: >60 mL/min (ref >60)
GFR calc Af Amer: 57 mL/min — ABNORMAL LOW (ref >60)
GFR calc non Af Amer: 58 mL/min — ABNORMAL LOW (ref >60)
GFR calc non Af Amer: 49 mL/min — ABNORMAL LOW (ref >60)
Glucose, Bld: 213 mg/dL — ABNORMAL HIGH (ref 70–99)
Glucose, Bld: 218 mg/dL — ABNORMAL HIGH (ref 70–99)
Potassium: 3.8 mmol/L (ref 3.5–5.1)
Potassium: 3.2 mmol/L — ABNORMAL LOW (ref 3.5–5.1)
Sodium: 147 mmol/L — ABNORMAL HIGH (ref 135–145)
Sodium: 146 mmol/L — ABNORMAL HIGH (ref 135–145)

## 2019-05-22 LAB — CBC WITH DIFFERENTIAL/PLATELET
Abs Immature Granulocytes: 0.1 10*3/uL — ABNORMAL HIGH (ref 0.00–0.07)
Basophils Absolute: 0.1 10*3/uL (ref 0.0–0.1)
Basophils Relative: 1 %
Eosinophils Absolute: 0.1 10*3/uL (ref 0.0–0.5)
Eosinophils Relative: 1 %
HCT: 30 % — ABNORMAL LOW (ref 39.0–52.0)
Hemoglobin: 10.2 g/dL — ABNORMAL LOW (ref 13.0–17.0)
Immature Granulocytes: 1 %
Lymphocytes Relative: 15 %
Lymphs Abs: 1.4 10*3/uL (ref 0.7–4.0)
MCH: 35.2 pg — ABNORMAL HIGH (ref 26.0–34.0)
MCHC: 34 g/dL (ref 30.0–36.0)
MCV: 103.4 fL — ABNORMAL HIGH (ref 80.0–100.0)
Monocytes Absolute: 0.3 10*3/uL (ref 0.1–1.0)
Monocytes Relative: 3 %
Neutro Abs: 7.2 10*3/uL (ref 1.7–7.7)
Neutrophils Relative %: 79 %
Platelets: 96 10*3/uL — ABNORMAL LOW (ref 150–400)
RBC: 2.9 MIL/uL — ABNORMAL LOW (ref 4.22–5.81)
RDW: 15.8 % — ABNORMAL HIGH (ref 11.5–15.5)
WBC: 9.2 10*3/uL (ref 4.0–10.5)
nRBC: 1.9 % — ABNORMAL HIGH (ref 0.0–0.2)

## 2019-05-22 LAB — GLUCOSE, CAPILLARY
Glucose-Capillary: 170 mg/dL — ABNORMAL HIGH (ref 70–99)
Glucose-Capillary: 182 mg/dL — ABNORMAL HIGH (ref 70–99)
Glucose-Capillary: 184 mg/dL — ABNORMAL HIGH (ref 70–99)
Glucose-Capillary: 196 mg/dL — ABNORMAL HIGH (ref 70–99)
Glucose-Capillary: 206 mg/dL — ABNORMAL HIGH (ref 70–99)
Glucose-Capillary: 211 mg/dL — ABNORMAL HIGH (ref 70–99)

## 2019-05-22 LAB — COOXEMETRY PANEL
Carboxyhemoglobin: 1.1 % (ref 0.5–1.5)
Methemoglobin: 0.8 % (ref 0.0–1.5)
O2 Saturation: 67.6 %
Total hemoglobin: 9.9 g/dL — ABNORMAL LOW (ref 12.0–16.0)

## 2019-05-22 LAB — LIPID PANEL
Cholesterol: 80 mg/dL (ref 0–200)
HDL: 15 mg/dL — ABNORMAL LOW (ref >40)
LDL Cholesterol: 49 mg/dL (ref 0–99)
Total CHOL/HDL Ratio: 5.3 RATIO
Triglycerides: 79 mg/dL (ref <150)
VLDL: 16 mg/dL (ref 0–40)

## 2019-05-22 LAB — HEPARIN LEVEL (UNFRACTIONATED): Heparin Unfractionated: 0.24 IU/mL — ABNORMAL LOW (ref 0.30–0.70)

## 2019-05-22 LAB — PHOSPHORUS: Phosphorus: 1.9 mg/dL — ABNORMAL LOW (ref 2.5–4.6)

## 2019-05-22 LAB — MAGNESIUM: Magnesium: 2.2 mg/dL (ref 1.7–2.4)

## 2019-05-22 MED ORDER — INSULIN ASPART 100 UNIT/ML ~~LOC~~ SOLN
0.0000 [IU] | SUBCUTANEOUS | Status: DC
  Administered 2019-05-22: 4 [IU] via SUBCUTANEOUS
  Administered 2019-05-22: 7 [IU] via SUBCUTANEOUS
  Administered 2019-05-22 (×2): 4 [IU] via SUBCUTANEOUS
  Administered 2019-05-23 (×2): 11 [IU] via SUBCUTANEOUS
  Administered 2019-05-23: 4 [IU] via SUBCUTANEOUS
  Administered 2019-05-23: 11 [IU] via SUBCUTANEOUS
  Administered 2019-05-23 (×2): 7 [IU] via SUBCUTANEOUS
  Administered 2019-05-23: 4 [IU] via SUBCUTANEOUS
  Administered 2019-05-24: 11 [IU] via SUBCUTANEOUS
  Administered 2019-05-24 (×2): 7 [IU] via SUBCUTANEOUS
  Administered 2019-05-24 (×2): 4 [IU] via SUBCUTANEOUS
  Administered 2019-05-24 – 2019-05-25 (×2): 7 [IU] via SUBCUTANEOUS
  Administered 2019-05-25: 4 [IU] via SUBCUTANEOUS
  Administered 2019-05-25 – 2019-05-26 (×5): 7 [IU] via SUBCUTANEOUS
  Administered 2019-05-26: 4 [IU] via SUBCUTANEOUS

## 2019-05-22 MED ORDER — HEPARIN (PORCINE) 25000 UT/250ML-% IV SOLN
1350.0000 [IU]/h | INTRAVENOUS | Status: DC
  Administered 2019-05-22: 1400 [IU]/h via INTRAVENOUS
  Administered 2019-05-23: 1500 [IU]/h via INTRAVENOUS
  Administered 2019-05-23 – 2019-05-26 (×4): 1350 [IU]/h via INTRAVENOUS
  Filled 2019-05-22 (×6): qty 250

## 2019-05-22 MED ORDER — PENICILLIN G POTASSIUM 20000000 UNITS IJ SOLR
12.0000 10*6.[IU] | Freq: Two times a day (BID) | INTRAVENOUS | Status: DC
  Administered 2019-05-22 – 2019-05-23 (×3): 12 10*6.[IU] via INTRAVENOUS
  Filled 2019-05-22 (×3): qty 12

## 2019-05-22 MED ORDER — QUETIAPINE FUMARATE 25 MG PO TABS
25.0000 mg | ORAL_TABLET | Freq: Two times a day (BID) | ORAL | Status: DC
  Administered 2019-05-22 – 2019-05-26 (×9): 25 mg
  Filled 2019-05-22 (×9): qty 1

## 2019-05-22 MED ORDER — PENICILLIN G POTASSIUM 20000000 UNITS IJ SOLR
12.0000 10*6.[IU] | Freq: Two times a day (BID) | INTRAVENOUS | Status: DC
  Filled 2019-05-22: qty 12

## 2019-05-22 MED ORDER — AMIODARONE HCL 200 MG PO TABS
200.0000 mg | ORAL_TABLET | Freq: Every day | ORAL | Status: DC
  Administered 2019-05-22: 200 mg via ORAL
  Filled 2019-05-22: qty 1

## 2019-05-22 MED ORDER — FUROSEMIDE 10 MG/ML IJ SOLN
80.0000 mg | Freq: Two times a day (BID) | INTRAMUSCULAR | Status: DC
  Administered 2019-05-22 (×2): 80 mg via INTRAVENOUS
  Filled 2019-05-22 (×2): qty 8

## 2019-05-22 MED ORDER — VANCOMYCIN HCL 10 G IV SOLR
1500.0000 mg | INTRAVENOUS | Status: DC
  Filled 2019-05-22: qty 1500

## 2019-05-22 NOTE — Progress Notes (Signed)
STROKE TEAM PROGRESS NOTE   INTERVAL HISTORY Patient remains intubated and sedated but is easily arousable and follows simple commands.  He had TEE yesterday which did not show definite vegetations or thrombosis but showed severe calcification of the mitral valve and stenosis.  MRI scan of the brain showed multifocal cortical and subcortical infarcts involving both cerebral hemispheres as well as cerebellum in the pattern of distribution which may be compatible with endocarditis the patient also has atrial fibrillation and cardiomyopathy and low ejection fraction.  CT angiogram earlier had shown no evidence of mycotic aneurysms  Vitals:   05/22/19 1317 05/22/19 1400 05/22/19 1415 05/22/19 1520  BP: (!) 90/55 (!) 90/51 102/63 (!) 128/51  Pulse:    88  Resp: 18 20 (!) 22 20  Temp:      TempSrc:      SpO2: 97% 97% 97% 100%  Weight:      Height:        CBC:  Recent Labs  Lab Jan 01, 2019 2018  05/21/19 0342 05/22/19 0326  WBC 7.7   < > 10.0 9.2  NEUTROABS 6.3  --   --  7.2  HGB 11.7*   < > 10.6* 10.2*  HCT 34.8*   < > 29.8* 30.0*  MCV 103.0*   < > 101.4* 103.4*  PLT 67*   < > 122* 96*   < > = values in this interval not displayed.    Basic Metabolic Panel:  Recent Labs  Lab 05/21/19 0342 05/21/19 1500 05/22/19 0326  NA 144 145 147*  K 3.3* 3.7 3.8  CL 108 111 112*  CO2 24 25 24   GLUCOSE 170* 168* 213*  BUN 47* 46* 42*  CREATININE 1.29* 1.33* 1.22  CALCIUM 8.3* 8.3* 8.0*  MG 2.2  --  2.2  PHOS  --   --  1.9*   Lipid Panel:     Component Value Date/Time   TRIG 146 05/21/2019 0411   HgbA1c:  Lab Results  Component Value Date   HGBA1C 5.9 (H) 05/17/2019   Urine Drug Screen:     Component Value Date/Time   LABOPIA NONE DETECTED 05/17/2019 0336   COCAINSCRNUR NONE DETECTED 05/17/2019 0336   LABBENZ NONE DETECTED 05/17/2019 0336   AMPHETMU NONE DETECTED 05/17/2019 0336   THCU NONE DETECTED 05/17/2019 0336   LABBARB NONE DETECTED 05/17/2019 0336    Alcohol Level      Component Value Date/Time   [facility] 05/17/2019 0246    IMAGING Mr Brain W Wo Contrast  Result Date: 05/21/2019 CLINICAL DATA:  78 year old male with evidence of right MCA territory infarct on head CT 05/18/2019. No emergent large vessel occlusion on subsequent CTA. EXAM: MRI HEAD WITHOUT AND WITH CONTRAST TECHNIQUE: Multiplanar, multiecho pulse sequences of the brain and surrounding structures were obtained without and with intravenous contrast. CONTRAST:  9.695mL GADAVIST GADOBUTROL 1 MMOL/ML IV SOLN COMPARISON:  CTA head and neck 05/19/2019. Head CT 05/18/2019. [facility] FINDINGS: Brain: There is a 2-3 centimeter confluent infarct in the posterior right MCA territory involving the sensory strip and subcortical white matter. However, there are also numerous small scattered cortical and white matter foci of restricted diffusion in both hemispheres, including anterior and posterior circulation, the right cerebellar hemisphere, and left cerebellar peduncle. The brainstem is spared. There is thalamic and basal ganglia involvement. Associated T2 and FLAIR hyperintensity. Possible petechial hemorrhage (e.g. Series 8, image 71, 79), but no malignant hemorrhagic transformation or intracranial mass effect. Superimposed small  chronic infarcts in the left cerebellum which have increased in number since 2015. No midline shift, mass effect, evidence of mass lesion, ventriculomegaly, extra-axial collection or acute intracranial hemorrhage. Cervicomedullary junction and pituitary are within normal limits. No abnormal enhancement identified.  No dural thickening. Vascular: Major intracranial vascular flow voids are preserved. The major dural venous sinuses are enhancing and appear to be patent. Skull and upper cervical spine: Negative visible cervical spine. Visualized bone marrow signal is within normal limits. Sinuses/Orbits: Postoperative changes to both globes since 2015. Mild  paranasal sinus mucosal thickening. Other: Intubated. Fluid in the pharynx. Trace mastoid fluid. Visible internal auditory structures appear normal. IMPRESSION: 1. Numerous small acute infarcts scattered in both cerebral and cerebellar hemispheres in addition to the 2-3 centimeter more confluent posterior right MCA infarcts seen recently by CT. This configuration is consistent with recent embolic event from the heart or proximal aorta. 2. Possible acute petechial hemorrhage, but no malignant hemorrhagic transformation or intracranial mass effect. 3. Underlying chronic small vessel ischemia, including in the left cerebellum. Electronically Signed   By: Odessa FlemingH  Hall M.D.   On: 05/21/2019 23:35   Dg Chest Port 1 View  Result Date: 05/22/2019 CLINICAL DATA:  ETT, respiratory failure EXAM: PORTABLE CHEST 1 VIEW COMPARISON:  05/21/2019 FINDINGS: Interval removal of a right neck vascular sheath. Support apparatus is otherwise unchanged including endotracheal tube over the mid trachea, esophagogastric tube with tip and side port below the diaphragm, and left neck vascular catheter. Unchanged diffuse bilateral interstitial pulmonary opacity and small layering bilateral pleural effusions, consistent with edema. Cardiomegaly status post median sternotomy, CABG, aortic valve prosthetic replacement, and aortic valve stent endograft. IMPRESSION: 1. Interval removal of a right neck vascular sheath. Support apparatus is otherwise unchanged including endotracheal tube over the mid trachea, esophagogastric tube with tip and side port below the diaphragm, and left neck vascular catheter. 2. Unchanged diffuse bilateral interstitial pulmonary opacity and small layering bilateral pleural effusions, consistent with edema. 3. Cardiomegaly status post median sternotomy, CABG, aortic valve prosthetic replacement, and aortic valve stent endograft. Electronically Signed   By: Lauralyn PrimesAlex  Bibbey M.D.   On: 05/22/2019 08:32   Dg Chest Port 1  View  Result Date: 05/21/2019 CLINICAL DATA:  Central line placement EXAM: PORTABLE CHEST 1 VIEW COMPARISON:  Same-day radiograph FINDINGS: Interval placement of left internal jugular approach central venous catheter with distal tip terminating the level of the distal SVC. Stable positioning of right-sided central venous catheter. Endotracheal tube terminates approximately 3.7 cm superior to the carina. Enteric tube terminates within the stomach. Median sternotomy wires. Stable cardiomediastinal silhouette. Patchy bibasilar airspace opacities with small bilateral pleural effusions, not significantly changed. No pneumothorax. IMPRESSION: 1. Interval left IJ central venous catheter.  No pneumothorax. 2. Remaining lines and tubes in stable positioning. 3. Persistent bilateral airspace opacities and pleural effusions. Electronically Signed   By: Duanne GuessNicholas  Plundo M.D.   On: 05/21/2019 12:55   Dg Chest Port 1 View  Result Date: 05/21/2019 CLINICAL DATA:  Endotracheal tube placement.  Respiratory failure. EXAM: PORTABLE CHEST 1 VIEW COMPARISON:  05/20/2019 FINDINGS: The endotracheal tube terminates above the carina. There is a right-sided central venous catheter with stable positioning. The enteric tube appears to extend below the left hemidiaphragm. The heart is enlarged. There are small to moderate-sized bilateral pleural effusions, right greater than left with presumed adjacent atelectasis. There are findings of pulmonary edema. IMPRESSION: 1. Lines and tubes as above. 2. Persistent pulmonary edema with small to moderate-sized bilateral pleural effusions. Electronically  Signed   By: Katherine Mantle M.D.   On: 05/21/2019 08:39   Dg Chest Port 1 View  Result Date: 05/20/2019 CLINICAL DATA:  Status post EG tube and OG tube placement. EXAM: PORTABLE CHEST 1 VIEW COMPARISON:  Single-view of the chest 05/19/2019. FINDINGS: New endotracheal tube is in place with the tip in good position at the level of clavicular  heads approximately 3 cm above the carina. OG tube courses into the stomach and below the inferior margin of the film. Swan-Ganz catheter has been removed. Right IJ sheath remains in place. There is cardiomegaly and bilateral airspace disease most consistent with congestive failure. Small to moderate bilateral pleural effusions are larger on the left. Aeration is mildly improved with better expansion of the chest today. No pneumothorax. IMPRESSION: ET tube projects in good position approximately 3 cm above the carina. OG tube courses into the stomach and below the inferior margin the film. Bilateral airspace disease and effusions most consistent with congestive heart failure. Aeration appears mildly improved compared to yesterday's exam Electronically Signed   By: Drusilla Kanner M.D.   On: 05/20/2019 16:02    PHYSICAL EXAM Frail elderly Caucasian male who is intubated and sedated.. . Afebrile. Head is nontraumatic. Neck is supple without bruit.    Cardiac exam no murmur or gallop. Lungs are clear to auscultation. Distal pulses are well felt. Neurological Exam :   patient is sedated intubated .  He is drowsy but can be aroused.  He follows simple midline commands and moves right-sided extremities purposefully and left lower extremity as well to command.  Dolls eye movements are sluggish.   He has left lower facial weakness.  Tongue midline. . Deep tendon reflexes symmetric.  Sensation is intact bilaterally.  Plantars are downgoing.  Gait not tested.   ASSESSMENT/PLAN Mr. Justin Valencia is a 78 y.o. male with history of aortic valve replacement, numbness tingling in hands, mitral valve regurgitation, hypertension, hyperlipidemia, gait disorder, cerebrovascular disease, carotid artery stenosis, atrial fibrillation presenting after fall at home with progressive weakness. Found to be hypotensive w/ low EF with enterococcus bacteremia. CT showed infarct.  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  Patient is also subsequently developed atrial fibrillation during the hospitalization which also puts him at risk for further stroke  CT head acute/subacute R frontal parietal jxn infarct. Small vessel disease.   CTA head & neck no ELVO. Extensive atherosclerosis. High grade stenosis B cavernous and mod at prox B ICAs. Mild prox L PCA stenosis. Mod stenosis R VA. Intramural hematoma over distal aortic artch. BUL airspace dz w/ B effusions.   CT head w/w/o  And Ct angio brain pending  2D Echo TAVR w/ concern for severe prosthetic valve stenosis. EF 20-25%. atrial fibrillation.   TEE no definite vegetations.  Severe calcification of the mitral valve with stenosis.  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 BP Low on arrival and remains low  Hyperlipidemia  Home meds:  lipitor 10 and  lopid 600  LDL pending, goal < 70  Continue statin at discharge  Diabetes type II Controlled  HgbA1c 5.9, goal < 7.0  Other Stroke Risk Factors  Advanced age  Former Cigarette smoker, quit 14 yrs ago  Overweight, Body mass index is 29.21 kg/m., recommend weight loss, diet and exercise as appropriate   Coronary artery disease s/p CABG  B carotid disease  Aortic stenosis w/ AV replacement 2006  ASD w/ closure 2006  Hx MV endocartidits 2013  TAVR 4680  Acute systolic HF  Other Active Problems  AKI  Hypokalemia 3.2  Thrombocytopenia 56-97  LBBB  Hospital day # 5    MRI  shows multifocal bilateral bland infarcts and TEE shows no evidence of vegetations and CT angiogram shows no mycotic aneurysms hence recommend starting anticoagulation with IV heparin drip stroke protocol given atrial fibrillation he remains at high risk for further emboli and strokes.. . Discussed with Dr. Haroldine Laws who is in agreement with plan.  This patient is critically ill and at significant risk of neurological worsening, death and care requires constant monitoring of vital signs, hemodynamics,respiratory and cardiac monitoring, extensive review of multiple databases, frequent neurological assessment, discussion with family, other specialists and medical decision making of high complexity.I have made any additions or clarifications directly to the above note.This critical care time does not reflect procedure time, or teaching time or supervisory time of PA/NP/Med Resident etc but could involve care discussion time.  I spent 30 minutes of neurocritical care time  in the care of  this patient.      Antony Contras, MD Medical Director [facility] Pager: [phone] 05/22/2019 3:45 PM   To contact Stroke Continuity provider, please refer to [email or web address] After hours, contact General Neurology

## 2019-05-22 NOTE — Progress Notes (Signed)
Attempted wean 10/+5 with pt sitting up, MD was present. Pt desaturated into the high 60s and his RR increased into the mid 40s. Pt was placed back on full support and is stable at this time.

## 2019-05-22 NOTE — Progress Notes (Signed)
Pt desaturated into the mid 80s on wean of 10/+5. Placed pt back on previous mode. RT will attempt at a later time.

## 2019-05-22 NOTE — Progress Notes (Signed)
ANTICOAGULATION CONSULT NOTE - Follow Up Consult  Pharmacy Consult for Heparin Indication: atrial fibrillation  Allergies  Allergen Reactions  . Sulfamethoxazole Rash    [facility]    Patient Measurements: Height: 5\' 11"  (180.3 cm) Weight: 209 lb 7 oz (95 kg) IBW/kg (Calculated) : 75.3  Vital Signs: Temp: 100.3 F (37.9 C) (09/17 0809) Temp Source: Axillary (09/17 0809) BP: 167/78 (09/17 0804) Pulse Rate: 105 (09/17 0804)  Heparin dosing weight: 94.4 kg  Labs: Recent Labs    05/20/19 0443  05/20/19 1703 05/21/19 0342 05/21/19 1500 05/22/19 0326  HGB 11.2*   < > 11.2* 10.6*  --  10.2*  HCT 31.3*   < > 33.0* 29.8*  --  30.0*  PLT 97*  --   --  122*  --  96*  CREATININE 1.09  --   --  1.29* 1.33* 1.22   < > = values in this interval not displayed.    Estimated Creatinine Clearance: 61.6 mL/min (by C-G formula based on SCr of 1.22 mg/dL).   Medical History: Past Medical History:  Diagnosis Date  . Aortic stenosis    Aortic valve replacement 2006  . ASD secundum    Closure, October, 2006  . Atrial fibrillation (HCC)    Postoperative, previously treated with amiodarone and Coumadin, then stop  . Bilateral carotid artery disease (HCC)   . CAD, multiple vessel    Stress echo, January , 2012, no ischemia  . Carotid artery disease (HCC)    Bilateral, followed closely by Dr. Imogene Burnhen ([facility]), February, 2012  . Cerebrovascular disease 12/04/2014  . Chest wall pain    October, 2012  . Chronic back pain    And sternal  . Diabetes mellitus type II   . Ejection fraction    EF, . 60-65%,echo, January, 2012  . 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 Hx of CABG    2006, aortic valve replacement, ASD closure  . Hyperlipidemia    Abnormal LFTs in the past, Lipitor discontinued, followed by Dr. Dimas AguasHoward he  . Hypertension   . Mitral regurgitation    Echo, January, 2012  . Nephrolithiasis   . Numbness and tingling in hands    October, 2012  . S/P aortic valve  replacement    2006, bovine pericardial tissue valve /   , Normal function, echo, January, 2012    Medications:  Medications Prior to Admission  Medication Sig Dispense Refill Last Dose  . acetaminophen (TYLENOL) 500 MG tablet Take 1,000 mg by mouth every 6 (six) hours as needed for headache (pain).   30-Nov-2018 at am  . amLODipine (NORVASC) 5 MG tablet Take 5 mg by mouth daily.   30-Nov-2018 at am  . aspirin EC 81 MG tablet Take 81 mg by mouth at bedtime.   unknown  . atorvastatin (LIPITOR) 10 MG tablet Take 10 mg by mouth at bedtime.    unknown  . gemfibrozil (LOPID) 600 MG tablet Take 600 mg by mouth 2 (two) times daily.     30-Nov-2018 at am  . glipiZIDE (GLUCOTROL XL) 10 MG 24 hr tablet Take 10 mg by mouth daily with breakfast.   30-Nov-2018 at am  . Magnesium 250 MG TABS Take 250 mg by mouth at bedtime.    unknown  . Multiple Vitamin (MULTIVITAMIN WITH MINERALS) TABS tablet Take 1 tablet by mouth daily.   30-Nov-2018 at am  . OVER THE COUNTER MEDICATION Take 1 tablet by mouth at bedtime. Zinc 30  mg/ copper 2 mg   unknown    Assessment: 78 y.o. male admitted on 9/11 with weakness and fall at home. On admit, he was dyspnic, hypotensive, and found to have new Afib with no anticoagulation PTA. Pt also found to have EF of 20-25% on ECHO. He developed septic shock due to enterococcus bacteremia. TEE was negative for vegetations. Heparin was initially started but held due to risk of hemorrhage from septic embolism. Due to a lack of vegetations seen, pharmacy consulted to restart heparin. 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   Plts are low at 96. Hg/Hct are stable at 10.2/30.  Goal of Therapy:  Heparin level 0.3-0.5 units/ml Monitor platelets by anticoagulation protocol: Yes   Plan:  No bolus per Dr. Leonie Man Start heparin drip at 1400 units/hr  Check heparin level in 6hr from restart Monitor daily Heparin Level, CBC Watch for signs/symptoms of  bleeding  Sherren Kerns, PharmD PGY1 Acute Care Pharmacy Resident [phone] 05/22/2019 10:08 AM

## 2019-05-22 NOTE — Progress Notes (Signed)
[facility]   Reason for visit: Follow up on Enterococcal bacteremia  Interval History: remains intubated; TEE without vegetation.  Tmax 100.3, WBC wnl.  No acute events  Physical Exam: Constitutional:  Vitals:   05/22/19 0804 05/22/19 0809  BP: (!) 167/78   Pulse: (!) 105   Resp: (!) 28   Temp:  100.3 F (37.9 C)  SpO2: 95%    patient is sedated Eyes: anicteric  HENT: +ET Respiratory: respiratory effort on vent; CTA B Cardiovascular: RRR GI: soft  Review of Systems: Unable to be assessed due to patient factors  Lab Results  Component Value Date   WBC 9.2 05/22/2019   HGB 10.2 (L) 05/22/2019   HCT 30.0 (L) 05/22/2019   MCV 103.4 (H) 05/22/2019   PLT 96 (L) 05/22/2019    Lab Results  Component Value Date   CREATININE 1.22 05/22/2019   BUN 42 (H) 05/22/2019   NA 147 (H) 05/22/2019   K 3.8 05/22/2019   CL 112 (H) 05/22/2019   CO2 24 05/22/2019    Lab Results  Component Value Date   ALT 57 (H) 05/20/2019   AST 58 (H) 05/20/2019   ALKPHOS 64 05/20/2019     Microbiology: Recent Results (from the past 240 hour(s))  SARS Coronavirus 2 [facility](Hospital order, Performed in [facility] hospital lab) Nasopharyngeal Nasopharyngeal Swab     Status: None   Collection Time: 2019/04/02  8:20 PM   Specimen: Nasopharyngeal Swab  Result Value Ref Range Status   SARS Coronavirus 2 NEGATIVE NEGATIVE Final    Comment: (NOTE) If result is NEGATIVE SARS-CoV-2 target nucleic acids are NOT DETECTED. The SARS-CoV-2 RNA is generally detectable in upper and lower  respiratory specimens during the acute phase of infection. The lowest  concentration of SARS-CoV-2 viral copies this assay can detect is 250  copies / mL. A negative result does not preclude SARS-CoV-2 infection  and should not be used as the sole basis for treatment or other  patient management decisions.  A negative result may occur with  improper specimen collection / handling, submission of  specimen other  than nasopharyngeal swab, presence of viral mutation(s) within the  areas targeted by this assay, and inadequate number of viral copies  (<250 copies / mL). A negative result must be combined with clinical  observations, patient history, and epidemiological information. If result is POSITIVE SARS-CoV-2 target nucleic acids are DETECTED. The SARS-CoV-2 RNA is generally detectable in upper and lower  respiratory specimens dur ing the acute phase of infection.  Positive  results are indicative of active infection with SARS-CoV-2.  Clinical  correlation with patient history and other diagnostic information is  necessary to determine patient infection status.  Positive results do  not rule out bacterial infection or co-infection with other viruses. If result is PRESUMPTIVE POSTIVE SARS-CoV-2 nucleic acids MAY BE PRESENT.   A presumptive positive result was obtained on the submitted specimen  and confirmed on repeat testing.  While 2019 novel coronavirus  (SARS-CoV-2) nucleic acids may be present in the submitted sample  additional confirmatory testing may be necessary for epidemiological  and / or clinical management purposes  to differentiate between  SARS-CoV-2 and other Sarbecovirus currently known to infect humans.  If clinically indicated additional testing with an alternate test  methodology [phone] is advised. The SARS-CoV-2 RNA is generally  detectable in upper and lower respiratory sp ecimens during the acute  phase of infection. The expected result is Negative. Fact  Sheet for Patients:  BoilerBrush.com.cyhttps://www.fda.gov/media/136312/download Fact Sheet for Healthcare Providers: https://pope.com/https://www.fda.gov/media/136313/download This test is not yet approved or cleared by the [place] FDA and has been authorized for detection and/or diagnosis of SARS-CoV-2 by FDA under an Emergency Use Authorization (EUA).  This EUA will remain in effect (meaning this test can be used) for the  duration of the COVID-19 declaration under Section 564(b)(1) of the Act, 21 U.S.C. section 360bbb-3(b)(1), unless the authorization is terminated or revoked sooner. Performed at [facility], [address] [place], [place] [address]   Respiratory Panel by PCR     Status: None   Collection Time: 05/17/19  2:03 AM   Specimen: Respiratory  Result Value Ref Range Status   Adenovirus NOT DETECTED NOT DETECTED Final   Coronavirus 229E NOT DETECTED NOT DETECTED Final    Comment: (NOTE) The Coronavirus on the Respiratory Panel, DOES NOT test for the novel  Coronavirus (2019 nCoV)    Coronavirus HKU1 NOT DETECTED NOT DETECTED Final   Coronavirus NL63 NOT DETECTED NOT DETECTED Final   Coronavirus OC43 NOT DETECTED NOT DETECTED Final   Metapneumovirus NOT DETECTED NOT DETECTED Final   Rhinovirus / Enterovirus NOT DETECTED NOT DETECTED Final   Influenza A NOT DETECTED NOT DETECTED Final   Influenza B NOT DETECTED NOT DETECTED Final   Parainfluenza Virus 1 NOT DETECTED NOT DETECTED Final   Parainfluenza Virus 2 NOT DETECTED NOT DETECTED Final   Parainfluenza Virus 3 NOT DETECTED NOT DETECTED Final   Parainfluenza Virus 4 NOT DETECTED NOT DETECTED Final   Respiratory Syncytial Virus NOT DETECTED NOT DETECTED Final   Bordetella pertussis NOT DETECTED NOT DETECTED Final   Chlamydophila pneumoniae NOT DETECTED NOT DETECTED Final   Mycoplasma pneumoniae NOT DETECTED NOT DETECTED Final    Comment: Performed at [facility], [address] [place], [place] [address]  MRSA PCR Screening     Status: None   Collection Time: 05/17/19  2:03 AM   Specimen: Nasopharyngeal  Result Value Ref Range Status   MRSA by PCR NEGATIVE NEGATIVE Final    Comment:        The GeneXpert MRSA Assay (FDA approved for NASAL specimens only), is one component of a comprehensive MRSA colonization surveillance program. It is not intended to diagnose MRSA infection nor to guide or monitor treatment for  MRSA infections. Performed at [facility], [address] [place], [place] [address]   Culture, blood (routine x 2)     Status: Abnormal   Collection Time: 05/17/19  2:40 AM   Specimen: BLOOD LEFT HAND  Result Value Ref Range Status   Specimen Description BLOOD LEFT HAND  Final   Special Requests   Final    BOTTLES DRAWN AEROBIC ONLY Blood Culture results may not be optimal due to an inadequate volume of blood received in culture bottles   Culture  Setup Time   Final    GRAM POSITIVE COCCI IN PAIRS AND CHAINS AEROBIC BOTTLE ONLY CRITICAL RESULT CALLED TO, READ BACK BY AND VERIFIED WITH: A. MEYER, PHARMD AT 1935 ON 05/17/19 BY C. JESSUP, MT.    Culture (A)  Final    ENTEROCOCCUS FAECALIS SUSCEPTIBILITIES PERFORMED ON PREVIOUS CULTURE WITHIN THE LAST 5 DAYS. Performed at [facility], [address] [place], [place] [address]    Report Status 05/19/2019 FINAL  Final  Culture, blood (routine x 2)     Status: Abnormal   Collection Time: 05/17/19  2:44 AM  Specimen: BLOOD RIGHT HAND  Result Value Ref Range Status   Specimen Description BLOOD RIGHT HAND  Final   Special Requests   Final    BOTTLES DRAWN AEROBIC ONLY Blood Culture results may not be optimal due to an inadequate volume of blood received in culture bottles   Culture  Setup Time   Final    GRAM POSITIVE COCCI IN PAIRS AND CHAINS AEROBIC BOTTLE ONLY CRITICAL RESULT CALLED TO, READ BACK BY AND VERIFIED WITH: A. MEYER, PHARMD AT 1935 ON 05/17/19 BY C. JESSUP, MT. Performed at [facility], [address], [place], [place] [address]    Culture ENTEROCOCCUS FAECALIS (A)  Final   Report Status 05/19/2019 FINAL  Final   Organism ID, Bacteria ENTEROCOCCUS FAECALIS  Final      Susceptibility   Enterococcus faecalis - MIC*    AMPICILLIN <=2 SENSITIVE Sensitive     VANCOMYCIN 2 SENSITIVE Sensitive     GENTAMICIN SYNERGY SENSITIVE Sensitive     * ENTEROCOCCUS FAECALIS  Blood Culture ID Panel (Reflexed)      Status: Abnormal   Collection Time: 05/17/19  2:44 AM  Result Value Ref Range Status   Enterococcus species DETECTED (A) NOT DETECTED Final    Comment: CRITICAL RESULT CALLED TO, READ BACK BY AND VERIFIED WITH: A. MEYER, PHARMD AT 1935 ON 05/17/19 BY C. JESSUP, MT.    Vancomycin resistance NOT DETECTED NOT DETECTED Final   Listeria monocytogenes NOT DETECTED NOT DETECTED Final   Staphylococcus species NOT DETECTED NOT DETECTED Final   Staphylococcus aureus (BCID) NOT DETECTED NOT DETECTED Final   Streptococcus species NOT DETECTED NOT DETECTED Final   Streptococcus agalactiae NOT DETECTED NOT DETECTED Final   Streptococcus pneumoniae NOT DETECTED NOT DETECTED Final   Streptococcus pyogenes NOT DETECTED NOT DETECTED Final   Acinetobacter baumannii NOT DETECTED NOT DETECTED Final   Enterobacteriaceae species NOT DETECTED NOT DETECTED Final   Enterobacter cloacae complex NOT DETECTED NOT DETECTED Final   Escherichia coli NOT DETECTED NOT DETECTED Final   Klebsiella oxytoca NOT DETECTED NOT DETECTED Final   Klebsiella pneumoniae NOT DETECTED NOT DETECTED Final   Proteus species NOT DETECTED NOT DETECTED Final   Serratia marcescens NOT DETECTED NOT DETECTED Final   Haemophilus influenzae NOT DETECTED NOT DETECTED Final   Neisseria meningitidis NOT DETECTED NOT DETECTED Final   Pseudomonas aeruginosa NOT DETECTED NOT DETECTED Final   Candida albicans NOT DETECTED NOT DETECTED Final   Candida glabrata NOT DETECTED NOT DETECTED Final   Candida krusei NOT DETECTED NOT DETECTED Final   Candida parapsilosis NOT DETECTED NOT DETECTED Final   Candida tropicalis NOT DETECTED NOT DETECTED Final    Comment: Performed at [facility], [address], [place], [place] [address]  Culture, Urine     Status: None   Collection Time: 05/17/19  3:37 AM   Specimen: Urine, Catheterized  Result Value Ref Range Status   Specimen Description URINE, CATHETERIZED  Final   Special Requests NONE  Final    Culture   Final    NO GROWTH Performed at [facility], [address] [place], [place] [address]    Report Status 05/18/2019 FINAL  Final  Culture, blood (routine x 2)     Status: None (Preliminary result)   Collection Time: 05/18/19  4:08 PM   Specimen: BLOOD  Result Value Ref Range Status   Specimen Description BLOOD BLOOD RIGHT WRIST  Final   Special Requests   Final  AEROBIC BOTTLE ONLY Blood Culture results may not be optimal due to an inadequate volume of blood received in culture bottles   Culture   Final    NO GROWTH 3 DAYS Performed at [facility], [place] [address], [place], [place] [address]    Report Status PENDING  Incomplete  Culture, blood (routine x 2)     Status: None (Preliminary result)   Collection Time: 05/18/19  4:08 PM   Specimen: BLOOD  Result Value Ref Range Status   Specimen Description BLOOD BLOOD RIGHT HAND  Final   Special Requests AEROBIC BOTTLE ONLY Blood Culture adequate volume  Final   Culture   Final    NO GROWTH 3 DAYS Performed at [facility], [place] [address] [place], [place] [address]    Report Status PENDING  Incomplete    Impression/Plan:  1. Enterococcal bacteremia - TEE negative though still concern that the CNS emboli are septic in origin.  Repeat blood culture are ngtd. I favor prolonged treatment of 4 weeks from negative blood cultures on 9/13 through October 10.   2. Heart failure - high salt load with ampicillin so will change his antibiotics to vancomycin. Unable to use daptomycin since it does not have good CNS penetration.    3.  Access - CVC placed yesterday.  OK from Id standpoint for picc line when needed.

## 2019-05-22 NOTE — Progress Notes (Signed)
Advanced Heart Failure Rounding Note   Subjective:    Remains intubated. Will awaken. Agitated at times  Brain MRI 9/16 1. Numerous small acute infarcts scattered in both cerebral and cerebellar hemispheres in addition to the 2-3 centimeter more confluent posterior right MCA infarcts seen recently by CT. This configuration is consistent with recent embolic event from the heart or proximal aorta. 2. Possible acute petechial hemorrhage, but no malignant hemorrhagic transformation or intracranial mass effect. 3. Underlying chronic small vessel ischemia, including in the left cerebellum.  TEE 9/16 EF 25% with severe RV dysfunction. Severe MS. No obvious vegetation. Very heavy aortic plaquing  Remains on NE at 9 (down from 52). Back on milrinone 0.25 Co-ox 68%. Creatinine improved. Low-grade temps. F/u cultures remain negative. CVP 13 + frothy secretions  CXR with diffuse edema    Objective:   Weight Range:  Vital Signs:   Temp:  [97.8 F (36.6 C)-99.5 F (37.5 C)] 97.8 F (36.6 C) (09/17 0400) Pulse Rate:  [82-130] 94 (09/17 0340) Resp:  [14-20] 17 (09/17 0700) BP: (91-148)/(48-120) 117/64 (09/17 0700) SpO2:  [92 %-100 %] 95 % (09/17 0700) Arterial Line BP: (107-149)/(47-101) 111/47 (09/17 0700) FiO2 (%):  [40 %] 40 % (09/17 0400) Weight:  [95 kg] 95 kg (09/17 0340) Last BM Date: 05/19/19  Weight change: Filed Weights   05/20/19 0435 05/21/19 0500 05/22/19 0340  Weight: 92.5 kg 94.7 kg 95 kg    Intake/Output:   Intake/Output Summary (Last 24 hours) at 05/22/2019 0730 Last data filed at 05/22/2019 0700 Gross per 24 hour  Intake 2861.73 ml  Output 875 ml  Net 1986.73 ml     Physical Exam: CVP 13 General:  Intubated will respond minimally to pain  HEENT: +ETT Neck: supple. JVP elevated LIJ TLC Carotids 2+ bilat; no bruits. No lymphadenopathy or thryomegaly appreciated. Cor: PMI nondisplaced. Regular rate & rhythm. 2/6 AS Lungs: + crackles  Abdomen: soft,  nontender, nondistended. No hepatosplenomegaly. No bruits or masses. Good bowel sounds. Extremities: no cyanosis, clubbing, rash, tr edema Neuro: Intubated will respond minimally to pain     Telemetry: NSR 90s with PACs Personally reviewed    Labs: Basic Metabolic Panel: Recent Labs  Lab 05/14/2019 2018  05/17/19 0802  05/18/19 0615  05/19/19 0430  05/20/19 0443 05/20/19 1225 05/20/19 1703 05/21/19 0342 05/21/19 1500 05/22/19 0326  NA 132*   < > 136   < > 138   < > 139   < > 144 144 144 144 145 147*  K 3.1*   < > 3.3*   < > 3.4*   < > 3.5   < > 3.2* 3.6 3.9 3.3* 3.7 3.8  CL 102   < > 106  --  108  --  104  --  108  --   --  108 111 112*  CO2 16*  --  15*  --  17*  --  22  --  26  --   --  '24 25 24  ' GLUCOSE 145*   < > 104*  --  196*  --  205*  --  171*  --   --  170* 168* 213*  BUN 58*   < > 63*  --  63*  --  52*  --  44*  --   --  47* 46* 42*  CREATININE 2.14*   < > 1.90*  --  1.33*  --  1.09  --  1.09  --   --  1.29* 1.33* 1.22  CALCIUM 9.0  --  8.8*  --  8.2*  --  7.8*  --  8.1*  --   --  8.3* 8.3* 8.0*  MG 2.4  --  2.3  --  2.5*  --   --   --   --   --   --  2.2  --  2.2  PHOS  --   --   --   --   --   --   --   --   --   --   --   --   --  1.9*   < > = values in this interval not displayed.    Liver Function Tests: Recent Labs  Lab 05/25/2019 2018 05/17/19 0802 05/18/19 0615 05/19/19 0430 05/20/19 0443  AST 155* 156* 138* 95* 58*  ALT 56* 65* 76* 69* 57*  ALKPHOS 58 56 66 53 64  BILITOT 1.4* 1.6* 1.3* 1.3* 1.0  PROT 6.6 6.4* 6.0* 5.9* 5.9*  ALBUMIN 3.1* 2.9* 2.5* 2.5* 2.4*   No results for input(s): LIPASE, AMYLASE in the last 168 hours. No results for input(s): AMMONIA in the last 168 hours.  CBC: Recent Labs  Lab 05/09/2019 2018  05/18/19 0615  05/19/19 0430  05/20/19 0443 05/20/19 1225 05/20/19 1703 05/21/19 0342 05/22/19 0326  WBC 7.7   < > 5.8  --  8.2  --  9.1  --   --  10.0 9.2  NEUTROABS 6.3  --   --   --   --   --   --   --   --   --  7.2    HGB 11.7*   < > 11.5*   < > 11.3*   < > 11.2* 10.9* 11.2* 10.6* 10.2*  HCT 34.8*   < > 33.0*   < > 32.2*   < > 31.3* 32.0* 33.0* 29.8* 30.0*  MCV 103.0*   < > 98.8  --  99.7  --  101.0*  --   --  101.4* 103.4*  PLT 67*   < > 53*  --  56*  --  97*  --   --  122* 96*   < > = values in this interval not displayed.    Cardiac Enzymes: No results for input(s): CKTOTAL, CKMB, CKMBINDEX, TROPONINI in the last 168 hours.  BNP: BNP (last 3 results) No results for input(s): BNP in the last 8760 hours.  ProBNP (last 3 results) No results for input(s): PROBNP in the last 8760 hours.    Other results:  Imaging: Mr Jeri Cos Wo Contrast  Result Date: 05/21/2019 CLINICAL DATA:  78 year old male with evidence of right MCA territory infarct on head CT 05/18/2019. No emergent large vessel occlusion on subsequent CTA. EXAM: MRI HEAD WITHOUT AND WITH CONTRAST TECHNIQUE: Multiplanar, multiecho pulse sequences of the brain and surrounding structures were obtained without and with intravenous contrast. CONTRAST:  9.56m GADAVIST GADOBUTROL 1 MMOL/ML IV SOLN COMPARISON:  CTA head and neck 05/19/2019. Head CT 05/18/2019. [facility] FINDINGS: Brain: There is a 2-3 centimeter confluent infarct in the posterior right MCA territory involving the sensory strip and subcortical white matter. However, there are also numerous small scattered cortical and white matter foci of restricted diffusion in both hemispheres, including anterior and posterior circulation, the right cerebellar hemisphere, and left cerebellar peduncle. The brainstem is spared. There is thalamic and basal ganglia involvement. Associated T2 and FLAIR hyperintensity. Possible petechial hemorrhage (  e.g. Series 8, image 71, 79), but no malignant hemorrhagic transformation or intracranial mass effect. Superimposed small chronic infarcts in the left cerebellum which have increased in number since 2015. No midline shift, mass  effect, evidence of mass lesion, ventriculomegaly, extra-axial collection or acute intracranial hemorrhage. Cervicomedullary junction and pituitary are within normal limits. No abnormal enhancement identified.  No dural thickening. Vascular: Major intracranial vascular flow voids are preserved. The major dural venous sinuses are enhancing and appear to be patent. Skull and upper cervical spine: Negative visible cervical spine. Visualized bone marrow signal is within normal limits. Sinuses/Orbits: Postoperative changes to both globes since 2015. Mild paranasal sinus mucosal thickening. Other: Intubated. Fluid in the pharynx. Trace mastoid fluid. Visible internal auditory structures appear normal. IMPRESSION: 1. Numerous small acute infarcts scattered in both cerebral and cerebellar hemispheres in addition to the 2-3 centimeter more confluent posterior right MCA infarcts seen recently by CT. This configuration is consistent with recent embolic event from the heart or proximal aorta. 2. Possible acute petechial hemorrhage, but no malignant hemorrhagic transformation or intracranial mass effect. 3. Underlying chronic small vessel ischemia, including in the left cerebellum. Electronically Signed   By: Genevie Ann M.D.   On: 05/21/2019 23:35   Dg Chest Port 1 View  Result Date: 05/21/2019 CLINICAL DATA:  Central line placement EXAM: PORTABLE CHEST 1 VIEW COMPARISON:  Same-day radiograph FINDINGS: Interval placement of left internal jugular approach central venous catheter with distal tip terminating the level of the distal SVC. Stable positioning of right-sided central venous catheter. Endotracheal tube terminates approximately 3.7 cm superior to the carina. Enteric tube terminates within the stomach. Median sternotomy wires. Stable cardiomediastinal silhouette. Patchy bibasilar airspace opacities with small bilateral pleural effusions, not significantly changed. No pneumothorax. IMPRESSION: 1. Interval left IJ central  venous catheter.  No pneumothorax. 2. Remaining lines and tubes in stable positioning. 3. Persistent bilateral airspace opacities and pleural effusions. Electronically Signed   By: Davina Poke M.D.   On: 05/21/2019 12:55   Dg Chest Port 1 View  Result Date: 05/21/2019 CLINICAL DATA:  Endotracheal tube placement.  Respiratory failure. EXAM: PORTABLE CHEST 1 VIEW COMPARISON:  05/20/2019 FINDINGS: The endotracheal tube terminates above the carina. There is a right-sided central venous catheter with stable positioning. The enteric tube appears to extend below the left hemidiaphragm. The heart is enlarged. There are small to moderate-sized bilateral pleural effusions, right greater than left with presumed adjacent atelectasis. There are findings of pulmonary edema. IMPRESSION: 1. Lines and tubes as above. 2. Persistent pulmonary edema with small to moderate-sized bilateral pleural effusions. Electronically Signed   By: Constance Holster M.D.   On: 05/21/2019 08:39   Dg Chest Port 1 View  Result Date: 05/20/2019 CLINICAL DATA:  Status post EG tube and OG tube placement. EXAM: PORTABLE CHEST 1 VIEW COMPARISON:  Single-view of the chest 05/19/2019. FINDINGS: New endotracheal tube is in place with the tip in good position at the level of clavicular heads approximately 3 cm above the carina. OG tube courses into the stomach and below the inferior margin of the film. Swan-Ganz catheter has been removed. Right IJ sheath remains in place. There is cardiomegaly and bilateral airspace disease most consistent with congestive failure. Small to moderate bilateral pleural effusions are larger on the left. Aeration is mildly improved with better expansion of the chest today. No pneumothorax. IMPRESSION: ET tube projects in good position approximately 3 cm above the carina. OG tube courses into the stomach and below the inferior margin  the film. Bilateral airspace disease and effusions most consistent with congestive heart  failure. Aeration appears mildly improved compared to yesterday's exam Electronically Signed   By: Inge Rise M.D.   On: 05/20/2019 16:02     Medications:     Scheduled Medications:  aspirin  81 mg Per Tube Daily   atorvastatin  10 mg Per Tube q1800   chlorhexidine gluconate (MEDLINE KIT)  15 mL Mouth Rinse BID   Chlorhexidine Gluconate Cloth  6 each Topical Daily   enoxaparin (LOVENOX) injection  40 mg Subcutaneous Q24H   feeding supplement (PRO-STAT SUGAR FREE 64)  30 mL Per Tube TID   insulin aspart  0-9 Units Subcutaneous Q4H   mouth rinse  15 mL Mouth Rinse 10 times per day   pantoprazole (PROTONIX) IV  40 mg Intravenous Daily   sodium chloride flush  10-40 mL Intracatheter Q12H    Infusions:  sodium chloride 10 mL/hr at 05/22/19 0700   sodium chloride Stopped (05/19/19 1914)   amiodarone 30 mg/hr (05/22/19 0700)   ampicillin (OMNIPEN) IV Stopped (05/22/19 0343)   cefTRIAXone (ROCEPHIN)  IV Stopped (05/21/19 2132)   feeding supplement (VITAL AF 1.2 CAL) 55 mL/hr at 05/22/19 0700   fentaNYL infusion INTRAVENOUS 75 mcg/hr (05/22/19 0700)   milrinone 0.25 mcg/kg/min (05/22/19 0700)   norepinephrine (LEVOPHED) Adult infusion 9 mcg/min (05/22/19 0700)    PRN Medications: sodium chloride, acetaminophen, acetaminophen, fentaNYL, influenza vaccine adjuvanted, sennosides, sodium chloride flush   Assessment/Plan:   1. Shock - Suspect combination of cardiogenic and septic shock - EF newly down 55-60% in 7/20 -> 25% here. Unclear etiology. Trop moderately up but flat. ? AF vs septic CM - Initial swan numbers with low output and co-ox 44% - Bcx 2/2 enterococcus - Hemodynamics improved with inotrope support. Now on NE 9 and milrinone 0.25 - TEE confirms severe MS. No vegetation  - Continue abx and hemodynamic support.  - ID consult appreciated  2. Acute systolic HF - EF 40% see discussion above - CXR with diffuse edema - Co-ox 67% on NE 9 milrinone  0.25 - Diurese aggressively today  3. New onset AF with RVR  - Back in NSR today - continue amio  - Off heparin due to possibility of septic cerebral emboli and risk of hemorrhagic transformation  4. 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 Ampicillin has high-sodium load will discuss options with ID - Doubt he is surgical candidate - head CT with new infarct -> ? Septic emboli.  - Neuro has seen (appreciate recs) -> brain MRI with multiple diffuse infarcts  5. Neuro - he remains encephelopathic. Unclear how much is metabolic vs related to CVA - MRI with diffuse infarcts - ? Septic vs plaque - TEE 9/16 with no clear vegetation. Very heavy aortic plaque in descending aorta on TEE  6. Acute hypoxic respiratory failure - intubated 9/16. CCM following (thanks)  - CXR with diffuse pulmonary edema - diurese with lasix 80 IV bid  7. CAD s/p CABG - trop up but doubt ACS as it is flat - will need coronary angio if creatinine permits  8. Valvular disease  - TEE suggests severe MS. Likely due to calcifications no clear vegetation or thrombosis. Not surgical or MV-plasty candidate currently - Aortic  stenosis (s/p SAVR 2006, s/p TAVR 2016) gradients suggestive of moderate AS  9. Prognosis - he has multi-system issues with severe HF/cardiogenic shock with low EF and severe valvular disease as well as multiple embolic CVAs, fraility  - Prognosis extremely concerning.  - Will d/w ID and Neuro  - Continue supportive care - Consider starting [place] discussions with family and Palliative team. I have d/w wife   CRITICAL CARE Performed by: Glori Bickers  Total critical care time: 40 minutes  Critical care time was exclusive of separately billable procedures and treating other patients.  Critical  care was necessary to treat or prevent imminent or life-threatening deterioration.  Critical care was time spent personally by me (independent of midlevel providers or residents) on the following activities: development of treatment plan with patient and/or surrogate as well as nursing, discussions with consultants, evaluation of patient's response to treatment, examination of patient, obtaining history from patient or surrogate, ordering and performing treatments and interventions, ordering and review of laboratory studies, ordering and review of radiographic studies, pulse oximetry and re-evaluation of patient's condition.    Glori Bickers, MD  7:30 AM

## 2019-05-22 NOTE — Progress Notes (Signed)
NAME:  Justin Valencia, MRN:  [id number], DOB:  12/21/1940, LOS: 0 ADMISSION DATE:  06/01/2019, CONSULTATION DATE:  05/20/2019 REFERRING MD:  Merrilee JanskySkains - [facility], CHIEF COMPLAINT:  Hypotension   HPI  78 y/o M admitted 9/11 with several days of progressive weakness and a fall at home.  On admit, he was dyspneic and hypotensive.  Elevated troponin & ECHO with new evidence of decreased EF of 20-25%.  PA catheter placed with parameters consistent with profound cardiogenic shock.  Blood cultures with 2/2 enterococcus.    Past Medical History  HTN HLD CAD - s/p CABG 2006 with AVR (bovine) and ASD closure  Mitral Regurgitation  Bilateral carotid artery disease  AF - on amiodarone + coumadin  DM II   Cultures  COVID 9/11 >> negative  RVP 9/12 >> negative  BCID 9/12 >> enterococcus species UC 9/12 >> negative  BCx2 9/12 >> enterococcus faecalis >> pan-sensitive  BCx2 9/13 >>   Antibiotics  Cefepime 9/12 >> 9/13  Vanco 9/12 >> 9/14  Ampicillin 9/14 >>  Rocephin 9/12, 9/14 >>   Lines/Tubes  R IJ Cordis 9/12 >> 05/22/2019 Left IJ CVL 05/22/2019>> ETT 9/15 >>   Events  9/11 Admit  9/12 PA cath with profound cardiogenic shock, BC with enterococcus, AFwRVR, 6L 9/14 PCCM s/o.  PA 52/23, CVP 8, SVR 1149, PCWP 23 9/15 PCCM called back for respiratory insufficiency, intubated   Significant Diagnostic Tests  ECHO 9/12 >> LVEF 20-25%, LVEDP ~ 28mmHg, LAP ~ 40 CT Head 9/13 >> acute or subacute infarct in the R frontoparietal junction CTA Head/Neck 9/14 >> no emergent LVO, mild proximal L PCA stenosis, moderate stenosis of the proximal internal carotid arteries bilaterally, intramural hematoma over the distal aortic arch, bilateral upper lobe airspace disease with effusions  05/22/2019 TEE with a EF decreased to 25% Interim history/subjective:  Remains intubated on full mechanical ventilatory support.  Failed attempt at weaning this morning.  Objective   BP (!) 167/78   Pulse (!) 105    Temp 100.3 F (37.9 C) (Axillary)   Resp (!) 28   Ht 5\' 11"  (1.803 m)   Wt 95 kg   SpO2 95%   BMI 29.21 kg/m    Intake/Output Summary (Last 24 hours) at 05/22/2019 0837 Last data filed at 05/22/2019 0700 Gross per 24 hour  Intake 2761.56 ml  Output 825 ml  Net 1936.56 ml    Filed Weights   05/20/19 0435 05/21/19 0500 05/22/19 0340  Weight: 92.5 kg 94.7 kg 95 kg    Examination: General: Elderly male sedated on vent HEENT: Endotracheal tube is in place gastric tube is in place Neuro: Intermittently follows commands CV: Heart sounds are are regular PULM: Coarse rhonchi bilaterally GI: soft, bsx4 active  Extremities: warm/dry, 1+ edema  Skin: no rashes or lesions   Chest x-ray with bilateral edema new left IJ CVL l Labs   Recent Labs  Lab 05/20/19 0443  05/20/19 1703 05/21/19 0342 05/22/19 0326  HGB 11.2*   < > 11.2* 10.6* 10.2*  HCT 31.3*   < > 33.0* 29.8* 30.0*  WBC 9.1  --   --  10.0 9.2  PLT 97*  --   --  122* 96*   < > = values in this interval not displayed.   Recent Labs  Lab 05/19/2019 2018  05/17/19 0802  05/18/19 0615  05/19/19 0430  05/20/19 0443 05/20/19 1225 05/20/19 1703 05/21/19 0342 05/21/19 1500 05/22/19 0326  NA 132*   < >  136   < > 138   < > 139   < > 144 144 144 144 145 147*  K 3.1*   < > 3.3*   < > 3.4*   < > 3.5   < > 3.2* 3.6 3.9 3.3* 3.7 3.8  CL 102   < > 106  --  108  --  104  --  108  --   --  108 111 112*  CO2 16*  --  15*  --  17*  --  22  --  26  --   --  24 25 24   GLUCOSE 145*   < > 104*  --  196*  --  205*  --  171*  --   --  170* 168* 213*  BUN 58*   < > 63*  --  63*  --  52*  --  44*  --   --  47* 46* 42*  CREATININE 2.14*   < > 1.90*  --  1.33*  --  1.09  --  1.09  --   --  1.29* 1.33* 1.22  CALCIUM 9.0  --  8.8*  --  8.2*  --  7.8*  --  8.1*  --   --  8.3* 8.3* 8.0*  MG 2.4  --  2.3  --  2.5*  --   --   --   --   --   --  2.2  --  2.2  PHOS  --   --   --   --   --   --   --   --   --   --   --   --   --  1.9*   < > =  values in this interval not displayed.    Assessment & Plan:   Cardiogenic Shock with questionable bacteremia and sepsis Acute Systolic CHF / Concern for Infective Endocarditis LBBB Aortic Stenosis s/p Bovine AVR, MR CAD s/p CABG, Carotid Artery Disease Intramural Hematoma over Distal Aortic Arch -concern for possible AV insufficiency with enterococcus bacteremia and CVA P: Vasopressor support as needed Management per cardiology Noted to have decreased EF 25% on TEE 05/22/2019     Enterococcus Bacteremia P: Continue current antimicrobial therapy ID assistance appreciated  Subacute R Frontal Parietal CVA -CTA head neg for LVO P: Neurology input is greatly appreciated  AFwRVR P:  Amiodarone Treatment per cardiology  Vent dependent respiratory failure in the setting of bilateral airspace disease coupled with bilateral pleural effusions.  -RVP negative  P: Vent bundle Antimicrobial therapy per ID for bacteremia with enterococcus     Thrombocytopenia 97->122->92 -suspect related to valvular disease + enterococcus bacteremia  P: Continue to monitor  AKI Hypokalemia  Recent Labs  Lab 05/21/19 0342 05/21/19 1500 05/22/19 0326  K 3.3* 3.7 3.8   Lab Results  Component Value Date   CREATININE 1.22 05/22/2019   CREATININE 1.33 (H) 05/21/2019   CREATININE 1.29 (H) 05/21/2019    P: Diuresis per cardiology Continue to monitor electrolytes replete as needed  DM CBG (last 3)  Recent Labs    05/22/19 0005 05/22/19 0442 05/22/19 0811  GLUCAP 170* 211* 206*    P:  Sliding scale insulin protocol 05/22/2019 change to resistant scale  At Risk Malnutrition  P:  Currently on tube feeding   Best Practice  SUP: Pepcid  DVT:  Lovenox  PAD/Sedation: Fentanyl gtt  Disposition: ICU Family Communication: Updated as per cardiology  App CC  Time: 40 min    Richardson Landry Rojelio Uhrich ACNP Maryanna Shape PCCM Pager [phone] till 1 pm If no answer page [phone]  05/22/2019, 8:37 AM

## 2019-05-22 NOTE — Progress Notes (Signed)
ANTICOAGULATION CONSULT NOTE - Follow Up Consult  Pharmacy Consult for Heparin Indication: atrial fibrillation  Allergies  Allergen Reactions  . Sulfamethoxazole Rash    Reported by [facility] in 2016    Patient Measurements: Height: 5\' 11"  (180.3 cm) Weight: 209 lb 7 oz (95 kg) IBW/kg (Calculated) : 75.3  Vital Signs: Temp: 98.4 F (36.9 C) (09/17 1622) Temp Source: Oral (09/17 1622) BP: 103/58 (09/17 1800) Pulse Rate: 88 (09/17 1520)  Heparin dosing weight: 94.4 kg  Labs: Recent Labs    05/20/19 0443  05/20/19 1703 05/21/19 0342 05/21/19 1500 05/22/19 0326 05/22/19 1818 05/22/19 1842  HGB 11.2*   < > 11.2* 10.6*  --  10.2*  --   --   HCT 31.3*   < > 33.0* 29.8*  --  30.0*  --   --   PLT 97*  --   --  122*  --  96*  --   --   HEPARINUNFRC  --   --   --   --   --   --  0.24*  --   CREATININE 1.09  --   --  1.29* 1.33* 1.22  --  1.39*   < > = values in this interval not displayed.    Estimated Creatinine Clearance: 54 mL/min (A) (by C-G formula based on SCr of 1.39 mg/dL (H)).   Medical History: Past Medical History:  Diagnosis Date  . Aortic stenosis    Aortic valve replacement 2006  . ASD secundum    Closure, October, 2006  . Atrial fibrillation (HCC)    Postoperative, previously treated with amiodarone and Coumadin, then stop  . Bilateral carotid artery disease ([place])   . CAD, multiple vessel    Stress echo, January , 2012, no ischemia  . Carotid artery disease (HCC)    Bilateral, followed closely by Dr. Bridgett Larsson ([facility]), February, 2012  . Cerebrovascular disease 12/04/2014  . Chest wall pain    October, 2012  . Chronic back pain    And sternal  . Diabetes mellitus type II   . Ejection fraction    EF, . 60-65%,echo, January, 2012  . 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 Hx of CABG    2006, aortic valve replacement, ASD closure  . Hyperlipidemia    Abnormal LFTs in the past, Lipitor discontinued, followed by Dr. Nadara Mustard he  . Hypertension   . Mitral  regurgitation    Echo, January, 2012  . Nephrolithiasis   . Numbness and tingling in hands    October, 2012  . S/P aortic valve replacement    2006, bovine pericardial tissue valve /   , Normal function, echo, January, 2012    Medications:  Medications Prior to Admission  Medication Sig Dispense Refill Last Dose  . acetaminophen (TYLENOL) 500 MG tablet Take 1,000 mg by mouth every 6 (six) hours as needed for headache (pain).   05/25/2019 at am  . amLODipine (NORVASC) 5 MG tablet Take 5 mg by mouth daily.   05/31/2019 at am  . aspirin EC 81 MG tablet Take 81 mg by mouth at bedtime.   unknown  . atorvastatin (LIPITOR) 10 MG tablet Take 10 mg by mouth at bedtime.    unknown  . gemfibrozil (LOPID) 600 MG tablet Take 600 mg by mouth 2 (two) times daily.     05/19/2019 at am  . glipiZIDE (GLUCOTROL XL) 10 MG 24 hr tablet Take 10 mg by mouth daily with breakfast.   05/07/2019 at am  .  Magnesium 250 MG TABS Take 250 mg by mouth at bedtime.    unknown  . Multiple Vitamin (MULTIVITAMIN WITH MINERALS) TABS tablet Take 1 tablet by mouth daily.   2019-07-20 at am  . OVER THE COUNTER MEDICATION Take 1 tablet by mouth at bedtime. Zinc 30 mg/ copper 2 mg   unknown    Assessment: 78 y.o. male admitted on 9/11 with weakness and fall at home. On admit, he was dyspnic, hypotensive, and found to have new Afib with no anticoagulation PTA. Pt also found to have EF of 20-25% on ECHO. He developed septic shock due to enterococcus bacteremia. TEE was negative for vegetations. Heparin was initially started but held due to risk of hemorrhage from septic embolism. Due to a lack of vegetations seen, pharmacy consulted to restart heparin. 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   Initial heparin level slightly below goal at 0.24, drawn ~1hr early so likely will trend up.  Goal of Therapy:  Heparin level 0.3-0.5 units/ml Monitor platelets by anticoagulation protocol: Yes    Plan:  -Increase heparin to 1500 units/hr -Recheck heparin level with morning labs   Fredonia HighlandMichael Bitonti, PharmD, BCPS Clinical Pharmacist [phone] Please check AMION for all [facility] numbers 05/22/2019

## 2019-05-22 NOTE — Progress Notes (Signed)
Visited with Mr. Pollok wife and offered prayer over Mr. Lemme, which Mrs. Halley accepted.  I will continue to follow-up with her throughout Mr. Rho stay.

## 2019-05-23 ENCOUNTER — Inpatient Hospital Stay (HOSPITAL_COMMUNITY): Payer: Medicare HMO

## 2019-05-23 DIAGNOSIS — B952 Enterococcus as the cause of diseases classified elsewhere: Secondary | ICD-10-CM

## 2019-05-23 DIAGNOSIS — R7881 Bacteremia: Secondary | ICD-10-CM

## 2019-05-23 DIAGNOSIS — I5021 Acute systolic (congestive) heart failure: Secondary | ICD-10-CM

## 2019-05-23 LAB — CBC WITH DIFFERENTIAL/PLATELET
Abs Immature Granulocytes: 0.11 10*3/uL — ABNORMAL HIGH (ref 0.00–0.07)
Basophils Absolute: 0 10*3/uL (ref 0.0–0.1)
Basophils Relative: 1 %
Eosinophils Absolute: 0.1 10*3/uL (ref 0.0–0.5)
Eosinophils Relative: 1 %
HCT: 29.4 % — ABNORMAL LOW (ref 39.0–52.0)
Hemoglobin: 10 g/dL — ABNORMAL LOW (ref 13.0–17.0)
Immature Granulocytes: 2 %
Lymphocytes Relative: 20 %
Lymphs Abs: 1.3 10*3/uL (ref 0.7–4.0)
MCH: 35.5 pg — ABNORMAL HIGH (ref 26.0–34.0)
MCHC: 34 g/dL (ref 30.0–36.0)
MCV: 104.3 fL — ABNORMAL HIGH (ref 80.0–100.0)
Monocytes Absolute: 0.2 10*3/uL (ref 0.1–1.0)
Monocytes Relative: 3 %
Neutro Abs: 4.6 10*3/uL (ref 1.7–7.7)
Neutrophils Relative %: 73 %
Platelets: 84 10*3/uL — ABNORMAL LOW (ref 150–400)
RBC: 2.82 MIL/uL — ABNORMAL LOW (ref 4.22–5.81)
RDW: 16 % — ABNORMAL HIGH (ref 11.5–15.5)
WBC: 6.3 10*3/uL (ref 4.0–10.5)
nRBC: 3.3 % — ABNORMAL HIGH (ref 0.0–0.2)

## 2019-05-23 LAB — CULTURE, BLOOD (ROUTINE X 2)
Culture: NO GROWTH
Culture: NO GROWTH
Special Requests: ADEQUATE

## 2019-05-23 LAB — BASIC METABOLIC PANEL
Anion gap: 10 (ref 5–15)
BUN: 40 mg/dL — ABNORMAL HIGH (ref 8–23)
CO2: 28 mmol/L (ref 22–32)
Calcium: 8.1 mg/dL — ABNORMAL LOW (ref 8.9–10.3)
Chloride: 110 mmol/L (ref 98–111)
Creatinine, Ser: 1.51 mg/dL — ABNORMAL HIGH (ref 0.61–1.24)
GFR calc Af Amer: 52 mL/min — ABNORMAL LOW (ref >60)
GFR calc non Af Amer: 45 mL/min — ABNORMAL LOW (ref >60)
Glucose, Bld: 214 mg/dL — ABNORMAL HIGH (ref 70–99)
Potassium: 3 mmol/L — ABNORMAL LOW (ref 3.5–5.1)
Sodium: 148 mmol/L — ABNORMAL HIGH (ref 135–145)

## 2019-05-23 LAB — COOXEMETRY PANEL
Carboxyhemoglobin: 1.5 % (ref 0.5–1.5)
Methemoglobin: 1.1 % (ref 0.0–1.5)
O2 Saturation: 64.4 %
Total hemoglobin: 10 g/dL — ABNORMAL LOW (ref 12.0–16.0)

## 2019-05-23 LAB — POCT I-STAT 7, (LYTES, BLD GAS, ICA,H+H)
Acid-Base Excess: 8 mmol/L — ABNORMAL HIGH (ref 0.0–2.0)
Bicarbonate: 29.8 mmol/L — ABNORMAL HIGH (ref 20.0–28.0)
Calcium, Ion: 1.1 mmol/L — ABNORMAL LOW (ref 1.15–1.40)
HCT: 31 % — ABNORMAL LOW (ref 39.0–52.0)
Hemoglobin: 10.5 g/dL — ABNORMAL LOW (ref 13.0–17.0)
O2 Saturation: 96 %
Patient temperature: 99.6
Potassium: 3.9 mmol/L (ref 3.5–5.1)
Sodium: 148 mmol/L — ABNORMAL HIGH (ref 135–145)
TCO2: 31 mmol/L (ref 22–32)
pCO2 arterial: 32.1 mmHg (ref 32.0–48.0)
pH, Arterial: 7.577 — ABNORMAL HIGH (ref 7.350–7.450)
pO2, Arterial: 68 mmHg — ABNORMAL LOW (ref 83.0–108.0)

## 2019-05-23 LAB — GLUCOSE, CAPILLARY
Glucose-Capillary: 181 mg/dL — ABNORMAL HIGH (ref 70–99)
Glucose-Capillary: 190 mg/dL — ABNORMAL HIGH (ref 70–99)
Glucose-Capillary: 203 mg/dL — ABNORMAL HIGH (ref 70–99)
Glucose-Capillary: 205 mg/dL — ABNORMAL HIGH (ref 70–99)
Glucose-Capillary: 268 mg/dL — ABNORMAL HIGH (ref 70–99)
Glucose-Capillary: 289 mg/dL — ABNORMAL HIGH (ref 70–99)

## 2019-05-23 LAB — HEPARIN LEVEL (UNFRACTIONATED)
Heparin Unfractionated: 0.45 IU/mL (ref 0.30–0.70)
Heparin Unfractionated: 0.64 IU/mL (ref 0.30–0.70)
Heparin Unfractionated: 0.14 IU/mL — ABNORMAL LOW (ref 0.30–0.70)

## 2019-05-23 LAB — PATHOLOGIST SMEAR REVIEW

## 2019-05-23 LAB — PHOSPHORUS: Phosphorus: 2.2 mg/dL — ABNORMAL LOW (ref 2.5–4.6)

## 2019-05-23 LAB — MAGNESIUM: Magnesium: 2 mg/dL (ref 1.7–2.4)

## 2019-05-23 MED ORDER — AMIODARONE HCL IN DEXTROSE 360-4.14 MG/200ML-% IV SOLN
60.0000 mg/h | INTRAVENOUS | Status: DC
  Administered 2019-05-23 – 2019-05-26 (×12): 60 mg/h via INTRAVENOUS
  Filled 2019-05-23 (×11): qty 200

## 2019-05-23 MED ORDER — POTASSIUM CHLORIDE 20 MEQ/15ML (10%) PO SOLN: 40.0000 meq | Freq: Once | ORAL | Status: DC

## 2019-05-23 MED ORDER — SODIUM CHLORIDE 0.9 % IV SOLN
2.0000 g | Freq: Two times a day (BID) | INTRAVENOUS | Status: DC
  Administered 2019-05-23 – 2019-05-26 (×6): 2 g via INTRAVENOUS
  Filled 2019-05-23: qty 2
  Filled 2019-05-23: qty 20
  Filled 2019-05-23: qty 2
  Filled 2019-05-23: qty 20
  Filled 2019-05-23 (×3): qty 2

## 2019-05-23 MED ORDER — AMIODARONE LOAD VIA INFUSION
150.0000 mg | Freq: Once | INTRAVENOUS | Status: AC
  Administered 2019-05-23: 150 mg via INTRAVENOUS
  Filled 2019-05-23: qty 83.34

## 2019-05-23 MED ORDER — FUROSEMIDE 10 MG/ML IJ SOLN
80.0000 mg | Freq: Three times a day (TID) | INTRAMUSCULAR | Status: DC
  Administered 2019-05-23: 80 mg via INTRAVENOUS
  Filled 2019-05-23: qty 8

## 2019-05-23 MED ORDER — SODIUM CHLORIDE 0.9 % IV BOLUS
250.0000 mL | Freq: Once | INTRAVENOUS | Status: AC
  Administered 2019-05-23: 250 mL via INTRAVENOUS

## 2019-05-23 MED ORDER — AMIODARONE IV BOLUS ONLY 150 MG/100ML
150.0000 mg | Freq: Once | INTRAVENOUS | Status: AC
  Administered 2019-05-23: 150 mg via INTRAVENOUS

## 2019-05-23 MED ORDER — CHLORHEXIDINE GLUCONATE 0.12 % MT SOLN
OROMUCOSAL | Status: AC
  Administered 2019-05-23: 15 mL via OROMUCOSAL
  Filled 2019-05-23: qty 15

## 2019-05-23 MED ORDER — AMIODARONE HCL 200 MG PO TABS
200.0000 mg | ORAL_TABLET | Freq: Every day | ORAL | Status: DC
  Administered 2019-05-23: 200 mg
  Filled 2019-05-23: qty 1

## 2019-05-23 MED ORDER — POTASSIUM PHOSPHATES 15 MMOLE/5ML IV SOLN
20.0000 mmol | Freq: Once | INTRAVENOUS | Status: AC
  Administered 2019-05-23: 20 mmol via INTRAVENOUS
  Filled 2019-05-23: qty 6.67

## 2019-05-23 MED ORDER — POTASSIUM CHLORIDE 20 MEQ PO PACK
40.0000 meq | PACK | Freq: Two times a day (BID) | ORAL | Status: AC
  Administered 2019-05-23 – 2019-05-24 (×3): 40 meq via ORAL
  Filled 2019-05-23 (×3): qty 2

## 2019-05-23 MED ORDER — AMIODARONE HCL IN DEXTROSE 360-4.14 MG/200ML-% IV SOLN
60.0000 mg/h | INTRAVENOUS | Status: AC
  Administered 2019-05-23 (×2): 60 mg/h via INTRAVENOUS
  Filled 2019-05-23: qty 200

## 2019-05-23 MED ORDER — SODIUM CHLORIDE 0.9 % IV SOLN
2.0000 g | INTRAVENOUS | Status: DC
  Administered 2019-05-23 – 2019-05-26 (×17): 2 g via INTRAVENOUS
  Filled 2019-05-23: qty 2
  Filled 2019-05-23: qty 2000
  Filled 2019-05-23 (×3): qty 2
  Filled 2019-05-23: qty 2000
  Filled 2019-05-23 (×2): qty 2
  Filled 2019-05-23 (×2): qty 2000
  Filled 2019-05-23: qty 2
  Filled 2019-05-23 (×5): qty 2000
  Filled 2019-05-23: qty 2
  Filled 2019-05-23 (×3): qty 2000

## 2019-05-23 MED ORDER — VASOPRESSIN 20 UNIT/ML IV SOLN
0.0100 [IU]/min | INTRAVENOUS | Status: DC
  Administered 2019-05-23: 0.03 [IU]/min via INTRAVENOUS
  Filled 2019-05-23 (×2): qty 2

## 2019-05-23 MED ORDER — AMIODARONE HCL IN DEXTROSE 360-4.14 MG/200ML-% IV SOLN
INTRAVENOUS | Status: AC
  Filled 2019-05-23: qty 400

## 2019-05-23 MED ORDER — SODIUM CHLORIDE 0.9 % IV BOLUS
500.0000 mL | Freq: Once | INTRAVENOUS | Status: AC
  Administered 2019-05-23: 500 mL via INTRAVENOUS

## 2019-05-23 NOTE — Progress Notes (Signed)
[facility] for Infectious Disease  Date of Admission:  05/10/2019     Total days of antibiotics 7        ASSESSMENT:  Mr. Mcphee has Enterrococcus faecalis bacteremia with hospitalization complicated by acute hypoxic respiratory failure, acute systolic HF and new onset Atrial fibrillation with RVR. Repeat blood cultures finalized without growth. In the absence of endocarditis will plan for 4 weeks of Penicillin G which we will use due to the sodium content of ampicillin. Start date of 9/13 which is when cultures were cleared with end date 06/14/19. OPAT orders placed and orders below should he be discharged prior to completion of therapy. ID will sign off and be available as needed for remainder of hospitalization.   PLAN:  1. Continue penicillin G. 2. OPAT orders placed. 3. Will need PICC when appropriate for prolonged / home IV therapy. 4. Follow up in ID clinic when able.   Diagnosis:  Enterococcus faecalis bacteremia  Culture Result: Enterococcus faecalis  Allergies  Allergen Reactions  . Sulfamethoxazole Rash    Reported by [place] in 2016    OPAT Orders Discharge antibiotics: Penicillin Per pharmacy protocol  Duration: 4 weeks  End Date: 06/14/19   PICC Care Per Protocol:  Labs weekly while on IV antibiotics: _X_ CBC with differential _X_ BMP __ CMP __ CRP __ ESR __ Vancomycin trough __ CK  __ Please pull PIC at completion of IV antibiotics _X_ Please leave PIC in place until doctor has seen patient or been notified  Fax weekly labs to [phone]  Clinic Follow Up Appt:  06/12/2019 at 2:30 with Dr. Prince Rome   Principal Problem:   Bacteremia due to Enterococcus Active Problems:   Cardiogenic shock ([place])   Cerebral embolism with cerebral infarction   Acute respiratory failure with hypoxia ([place])   . amiodarone  200 mg Per Tube Daily  . aspirin  81 mg Per Tube Daily  . atorvastatin  10 mg Per Tube q1800  . chlorhexidine  gluconate (MEDLINE KIT)  15 mL Mouth Rinse BID  . Chlorhexidine Gluconate Cloth  6 each Topical Daily  . feeding supplement (PRO-STAT SUGAR FREE 64)  30 mL Per Tube TID  . furosemide  80 mg Intravenous Q8H  . insulin aspart  0-20 Units Subcutaneous Q4H  . mouth rinse  15 mL Mouth Rinse 10 times per day  . pantoprazole (PROTONIX) IV  40 mg Intravenous Daily  . potassium chloride  40 mEq Oral BID  . QUEtiapine  25 mg Per Tube BID  . sodium chloride flush  10-40 mL Intracatheter Q12H    SUBJECTIVE:  Afebrile overnight with no acute events or concerns. Stable WBC count.   Allergies  Allergen Reactions  . Sulfamethoxazole Rash    Reported by [place] in 2016     Review of Systems: Review of Systems  Unable to perform ROS: Intubated    OBJECTIVE: Vitals:   05/23/19 0700 05/23/19 0754 05/23/19 0800 05/23/19 0825  BP: (!) 104/59 (!) 104/54 (!) 115/58   Pulse:  99    Resp: 18 (!) 27 (!) 30   Temp:    99.2 F (37.3 C)  TempSrc:    Oral  SpO2: 96% 96% 96%   Weight:      Height:       Body mass index is 28.23 kg/m.  Physical Exam Constitutional:      General: He is not in acute distress.    Appearance: He is  well-developed. He is ill-appearing.     Interventions: He is intubated and restrained.     Comments: Lying in bed with head of bed elevated; resting.   Cardiovascular:     Rate and Rhythm: Normal rate and regular rhythm.     Heart sounds: Murmur present.     Comments: Left IJ central line appears with dressing that is clean and dry and without evidence of infection.  Pulmonary:     Effort: Pulmonary effort is normal. He is intubated.     Breath sounds: Normal breath sounds.  Skin:    General: Skin is warm and dry.     Lab Results Lab Results  Component Value Date   WBC 6.3 05/23/2019   HGB 10.0 (L) 05/23/2019   HCT 29.4 (L) 05/23/2019   MCV 104.3 (H) 05/23/2019   PLT 84 (L) 05/23/2019    Lab Results  Component Value Date   CREATININE 1.51  (H) 05/23/2019   BUN 40 (H) 05/23/2019   NA 148 (H) 05/23/2019   K 3.0 (L) 05/23/2019   CL 110 05/23/2019   CO2 28 05/23/2019    Lab Results  Component Value Date   ALT 57 (H) 05/20/2019   AST 58 (H) 05/20/2019   ALKPHOS 64 05/20/2019   BILITOT 1.0 05/20/2019     Microbiology: Recent Results (from the past 240 hour(s))  SARS Coronavirus 2 [facility] order, Performed in [facility] lab) Nasopharyngeal Nasopharyngeal Swab     Status: None   Collection Time: 05/20/2019  8:20 PM   Specimen: Nasopharyngeal Swab  Result Value Ref Range Status   SARS Coronavirus 2 NEGATIVE NEGATIVE Final    Comment: (NOTE) If result is NEGATIVE SARS-CoV-2 target nucleic acids are NOT DETECTED. The SARS-CoV-2 RNA is generally detectable in upper and lower  respiratory specimens during the acute phase of infection. The lowest  concentration of SARS-CoV-2 viral copies this assay can detect is 250  copies / mL. A negative result does not preclude SARS-CoV-2 infection  and should not be used as the sole basis for treatment or other  patient management decisions.  A negative result may occur with  improper specimen collection / handling, submission of specimen other  than nasopharyngeal swab, presence of viral mutation(s) within the  areas targeted by this assay, and inadequate number of viral copies  (<250 copies / mL). A negative result must be combined with clinical  observations, patient history, and epidemiological information. If result is POSITIVE SARS-CoV-2 target nucleic acids are DETECTED. The SARS-CoV-2 RNA is generally detectable in upper and lower  respiratory specimens dur ing the acute phase of infection.  Positive  results are indicative of active infection with SARS-CoV-2.  Clinical  correlation with patient history and other diagnostic information is  necessary to determine patient infection status.  Positive results do  not rule out bacterial infection or co-infection with  other viruses. If result is PRESUMPTIVE POSTIVE SARS-CoV-2 nucleic acids MAY BE PRESENT.   A presumptive positive result was obtained on the submitted specimen  and confirmed on repeat testing.  While 2019 novel coronavirus  (SARS-CoV-2) nucleic acids may be present in the submitted sample  additional confirmatory testing may be necessary for epidemiological  and / or clinical management purposes  to differentiate between  SARS-CoV-2 and other Sarbecovirus currently known to infect humans.  If clinically indicated additional testing with an alternate test  methodology [phone]) is advised. The SARS-CoV-2 RNA is generally  detectable in upper and lower respiratory sp  ecimens during the acute  phase of infection. The expected result is Negative. Fact Sheet for Patients:  StrictlyIdeas.no Fact Sheet for Healthcare Providers: BankingDealers.co.za This test is not yet approved or cleared by the [place] FDA and has been authorized for detection and/or diagnosis of SARS-CoV-2 by FDA under an Emergency Use Authorization (EUA).  This EUA will remain in effect (meaning this test can be used) for the duration of the COVID-19 declaration under Section 564(b)(1) of the Act, 21 U.S.C. section 360bbb-3(b)(1), unless the authorization is terminated or revoked sooner. Performed at [facility], [place] [address], [place], [place] [address]   Respiratory Panel by PCR     Status: None   Collection Time: 05/17/19  2:03 AM   Specimen: Respiratory  Result Value Ref Range Status   Adenovirus NOT DETECTED NOT DETECTED Final   Coronavirus 229E NOT DETECTED NOT DETECTED Final    Comment: (NOTE) The Coronavirus on the Respiratory Panel, DOES NOT test for the novel  Coronavirus (2019 nCoV)    Coronavirus HKU1 NOT DETECTED NOT DETECTED Final   Coronavirus NL63 NOT DETECTED NOT DETECTED Final   Coronavirus OC43 NOT DETECTED NOT DETECTED Final    Metapneumovirus NOT DETECTED NOT DETECTED Final   Rhinovirus / Enterovirus NOT DETECTED NOT DETECTED Final   Influenza A NOT DETECTED NOT DETECTED Final   Influenza B NOT DETECTED NOT DETECTED Final   Parainfluenza Virus 1 NOT DETECTED NOT DETECTED Final   Parainfluenza Virus 2 NOT DETECTED NOT DETECTED Final   Parainfluenza Virus 3 NOT DETECTED NOT DETECTED Final   Parainfluenza Virus 4 NOT DETECTED NOT DETECTED Final   Respiratory Syncytial Virus NOT DETECTED NOT DETECTED Final   Bordetella pertussis NOT DETECTED NOT DETECTED Final   Chlamydophila pneumoniae NOT DETECTED NOT DETECTED Final   Mycoplasma pneumoniae NOT DETECTED NOT DETECTED Final    Comment: Performed at [facility], [place]. [address] [place], [place] [address]  MRSA PCR Screening     Status: None   Collection Time: 05/17/19  2:03 AM   Specimen: Nasopharyngeal  Result Value Ref Range Status   MRSA by PCR NEGATIVE NEGATIVE Final    Comment:        The GeneXpert MRSA Assay (FDA approved for NASAL specimens only), is one component of a comprehensive MRSA colonization surveillance program. It is not intended to diagnose MRSA infection nor to guide or monitor treatment for MRSA infections. Performed at [facility], [place] [address], [place], [place] [address]   Culture, blood (routine x 2)     Status: Abnormal   Collection Time: 05/17/19  2:40 AM   Specimen: BLOOD LEFT HAND  Result Value Ref Range Status   Specimen Description BLOOD LEFT HAND  Final   Special Requests   Final    BOTTLES DRAWN AEROBIC ONLY Blood Culture results may not be optimal due to an inadequate volume of blood received in culture bottles   Culture  Setup Time   Final    GRAM POSITIVE COCCI IN PAIRS AND CHAINS AEROBIC BOTTLE ONLY CRITICAL RESULT CALLED TO, READ BACK BY AND VERIFIED WITH: A. MEYER, PHARMD AT 1935 ON 05/17/19 BY C. JESSUP, MT.    Culture (A)  Final    ENTEROCOCCUS FAECALIS SUSCEPTIBILITIES PERFORMED ON PREVIOUS  CULTURE WITHIN THE LAST 5 DAYS. Performed at [facility], [place] [address] [place], [place] [address]    Report Status 05/19/2019 FINAL  Final  Culture, blood (routine x 2)  Status: Abnormal   Collection Time: 05/17/19  2:44 AM   Specimen: BLOOD RIGHT HAND  Result Value Ref Range Status   Specimen Description BLOOD RIGHT HAND  Final   Special Requests   Final    BOTTLES DRAWN AEROBIC ONLY Blood Culture results may not be optimal due to an inadequate volume of blood received in culture bottles   Culture  Setup Time   Final    GRAM POSITIVE COCCI IN PAIRS AND CHAINS AEROBIC BOTTLE ONLY CRITICAL RESULT CALLED TO, READ BACK BY AND VERIFIED WITH: A. MEYER, PHARMD AT 1935 ON 05/17/19 BY C. JESSUP, MT. Performed at [facility], [place] [address] [place], [place] [address]    Culture ENTEROCOCCUS FAECALIS (A)  Final   Report Status 05/19/2019 FINAL  Final   Organism ID, Bacteria ENTEROCOCCUS FAECALIS  Final      Susceptibility   Enterococcus faecalis - MIC*    AMPICILLIN <=2 SENSITIVE Sensitive     VANCOMYCIN 2 SENSITIVE Sensitive     GENTAMICIN SYNERGY SENSITIVE Sensitive     * ENTEROCOCCUS FAECALIS  Blood Culture ID Panel (Reflexed)     Status: Abnormal   Collection Time: 05/17/19  2:44 AM  Result Value Ref Range Status   Enterococcus species DETECTED (A) NOT DETECTED Final    Comment: CRITICAL RESULT CALLED TO, READ BACK BY AND VERIFIED WITH: A. MEYER, PHARMD AT 1935 ON 05/17/19 BY C. JESSUP, MT.    Vancomycin resistance NOT DETECTED NOT DETECTED Final   Listeria monocytogenes NOT DETECTED NOT DETECTED Final   Staphylococcus species NOT DETECTED NOT DETECTED Final   Staphylococcus aureus (BCID) NOT DETECTED NOT DETECTED Final   Streptococcus species NOT DETECTED NOT DETECTED Final   Streptococcus agalactiae NOT DETECTED NOT DETECTED Final   Streptococcus pneumoniae NOT DETECTED NOT DETECTED Final   Streptococcus pyogenes NOT DETECTED NOT DETECTED Final   Acinetobacter  baumannii NOT DETECTED NOT DETECTED Final   Enterobacteriaceae species NOT DETECTED NOT DETECTED Final   Enterobacter cloacae complex NOT DETECTED NOT DETECTED Final   Escherichia coli NOT DETECTED NOT DETECTED Final   Klebsiella oxytoca NOT DETECTED NOT DETECTED Final   Klebsiella pneumoniae NOT DETECTED NOT DETECTED Final   Proteus species NOT DETECTED NOT DETECTED Final   Serratia marcescens NOT DETECTED NOT DETECTED Final   Haemophilus influenzae NOT DETECTED NOT DETECTED Final   Neisseria meningitidis NOT DETECTED NOT DETECTED Final   Pseudomonas aeruginosa NOT DETECTED NOT DETECTED Final   Candida albicans NOT DETECTED NOT DETECTED Final   Candida glabrata NOT DETECTED NOT DETECTED Final   Candida krusei NOT DETECTED NOT DETECTED Final   Candida parapsilosis NOT DETECTED NOT DETECTED Final   Candida tropicalis NOT DETECTED NOT DETECTED Final    Comment: Performed at [facility], [address] [place], [place] [address]  Culture, Urine     Status: None   Collection Time: 05/17/19  3:37 AM   Specimen: Urine, Catheterized  Result Value Ref Range Status   Specimen Description URINE, CATHETERIZED  Final   Special Requests NONE  Final   Culture   Final    NO GROWTH Performed at [facility] [address], [place], [place] [address]    Report Status 05/18/2019 FINAL  Final  Culture, blood (routine x 2)     Status: None (Preliminary result)   Collection Time: 05/18/19  4:08 PM   Specimen: BLOOD  Result Value Ref Range Status   Specimen Description BLOOD BLOOD RIGHT WRIST  Final   Special Requests   Final    AEROBIC BOTTLE ONLY Blood Culture results may not be optimal due to an inadequate volume of blood received in culture bottles   Culture   Final    NO GROWTH 4 DAYS Performed at [facility], [place] [address] [place], [place] [address]    Report Status PENDING  Incomplete  Culture, blood (routine x 2)     Status: None (Preliminary result)   Collection  Time: 05/18/19  4:08 PM   Specimen: BLOOD  Result Value Ref Range Status   Specimen Description BLOOD BLOOD RIGHT HAND  Final   Special Requests AEROBIC BOTTLE ONLY Blood Culture adequate volume  Final   Culture   Final    NO GROWTH 4 DAYS Performed at [facility], [place] [address] [place], [place] [address]    Report Status PENDING  Incomplete     Terri Piedra, NP [facility] [phone] Pager  05/23/2019  9:51 AM

## 2019-05-23 NOTE — Progress Notes (Signed)
  Amiodarone Drug - Drug Interaction Consult Note  Recommendations: Monitor QTc and emergence of adverse effects. No drug dosing changes are necessary currently. There is an increased risk of QTc prolongation with patient's current quetiapine.   Amiodarone is metabolized by the cytochrome P450 system and therefore has the potential to cause many drug interactions. Amiodarone has an average plasma half-life of 50 days (range 20 to 100 days).   There is potential for drug interactions to occur several weeks or months after stopping treatment and the onset of drug interactions may be slow after initiating amiodarone.   [x]  Statins: Increased risk of myopathy. Simvastatin- restrict dose to 20mg  daily. Other statins: counsel patients to report any muscle pain or weakness immediately.  []  Anticoagulants: Amiodarone can increase anticoagulant effect. Consider warfarin dose reduction. Patients should be monitored closely and the dose of anticoagulant altered accordingly, remembering that amiodarone levels take several weeks to stabilize.  []  Antiepileptics: Amiodarone can increase plasma concentration of phenytoin, the dose should be reduced. Note that small changes in phenytoin dose can result in large changes in levels. Monitor patient and counsel on signs of toxicity.  []  Beta blockers: increased risk of bradycardia, AV block and myocardial depression. Sotalol - avoid concomitant use.  []   Calcium channel blockers (diltiazem and verapamil): increased risk of bradycardia, AV block and myocardial depression.  []   Cyclosporine: Amiodarone increases levels of cyclosporine. Reduced dose of cyclosporine is recommended.  []  Digoxin dose should be halved when amiodarone is started.  [x]  Diuretics: increased risk of cardiotoxicity if hypokalemia occurs.  []  Oral hypoglycemic agents (glyburide, glipizide, glimepiride): increased risk of hypoglycemia. Patient's glucose levels should be monitored closely  when initiating amiodarone therapy.   [x]  Drugs that prolong the QT interval:  Torsades de pointes risk may be increased with concurrent use - avoid if possible.  Monitor QTc, also keep magnesium/potassium WNL if concurrent therapy can't be avoided. Marland Kitchen Antibiotics: e.g. fluoroquinolones, erythromycin. . Antiarrhythmics: e.g. quinidine, procainamide, disopyramide, sotalol. . Antipsychotics: e.g. phenothiazines, haloperidol.  . Lithium, tricyclic antidepressants, and methadone.  Thank You,  Sherren Kerns, PharmD PGY1 Acute Care Pharmacy Resident [phone] 05/23/2019 12:44 PM

## 2019-05-23 NOTE — Progress Notes (Signed)
ANTICOAGULATION CONSULT NOTE - Follow Up Consult  Pharmacy Consult for heparin Indication: atrial fibrillation and stroke  Labs: Recent Labs    05/21/19 0342  05/22/19 0326  05/22/19 1842 05/23/19 0405 05/23/19 1222 05/23/19 1250 05/23/19 2217  HGB 10.6*  --  10.2*  --   --  10.0*  --  10.5*  --   HCT 29.8*  --  30.0*  --   --  29.4*  --  31.0*  --   PLT 122*  --  96*  --   --  84*  --   --   --   HEPARINUNFRC  --   --   --    < >  --  0.45 0.64  --  0.14*  CREATININE 1.29*   < > 1.22  --  1.39* 1.51*  --   --   --    < > = values in this interval not displayed.    Assessment: 77yo male subtherapeutic on heparin after rate change; no gtt issues or signs of bleeding per RN.  Goal of Therapy:  Heparin level 0.3-0.5 units/ml   Plan:  Will increase heparin gtt by 1 unit/kg/hr to 1450 units/hr and check level in 8 hours.    Wynona Neat, PharmD, BCPS  05/23/2019,11:28 PM

## 2019-05-23 NOTE — Progress Notes (Signed)
NAME:  Justin Valencia, MRN:  [id number], DOB:  1941-01-03, LOS: 0 ADMISSION DATE:  06/07/19, CONSULTATION DATE:  06-07-2019 REFERRING MD:  [facility], CHIEF COMPLAINT:  Hypotension   HPI  78 y/o M admitted 9/11 with several days of progressive weakness and a fall at home.  On admit, he was dyspneic and hypotensive.  Elevated troponin & ECHO with new evidence of decreased EF of 20-25%.  PA catheter placed with parameters consistent with profound cardiogenic shock.  Blood cultures with 2/2 enterococcus.    Past Medical History  HTN HLD CAD - s/p CABG 2006 with AVR (bovine) and ASD closure  Mitral Regurgitation  Bilateral carotid artery disease  AF - on amiodarone + coumadin  DM II   Cultures  COVID 9/11 >> negative  RVP 9/12 >> negative  BCID 9/12 >> enterococcus species UC 9/12 >> negative  BCx2 9/12 >> enterococcus faecalis >> pan-sensitive  BCx2 9/13 >>   Antibiotics  Cefepime 9/12 >> 9/13  Vanco 9/12 >> 9/14  Ampicillin 9/14 >>  Rocephin 9/12, 9/14 >>   Lines/Tubes  R IJ Cordis 9/12 >> 05/22/2019 Left IJ CVL 05/22/2019>> ETT 9/15 >>   Events  9/11 Admit  9/12 PA cath with profound cardiogenic shock, BC with enterococcus, AFwRVR, 6L 9/14 PCCM s/o.  PA 52/23, CVP 8, SVR 1149, PCWP 23 9/15 PCCM called back for respiratory insufficiency, intubated   Significant Diagnostic Tests  ECHO 9/12 >> LVEF 20-25%, LVEDP ~ 31mmHg, LAP ~ 40 CT Head 9/13 >> acute or subacute infarct in the R frontoparietal junction CTA Head/Neck 9/14 >> no emergent LVO, mild proximal L PCA stenosis, moderate stenosis of the proximal internal carotid arteries bilaterally, intramural hematoma over the distal aortic arch, bilateral upper lobe airspace disease with effusions  05/22/2019 TEE with a EF decreased to 25% Interim history/subjective:  Remains intubated full mechanical ventilatory support.  Affect is somewhat dull despite being off sedation  Objective   BP (!) 104/54   Pulse 99    Temp 99.2 F (37.3 C) (Oral)   Resp (!) 27   Ht 5\' 11"  (1.803 m)   Wt 91.8 kg   SpO2 96%   BMI 28.23 kg/m    Intake/Output Summary (Last 24 hours) at 05/23/2019 0837 Last data filed at 05/23/2019 0600 Gross per 24 hour  Intake 2236.97 ml  Output 3990 ml  Net -1753.03 ml    Filed Weights   05/22/19 0340 05/22/19 1000 05/23/19 0440  Weight: 95 kg 95 kg 91.8 kg    Examination: General: Elderly male who is currently off sedation but appears somewhat stunned HEENT: No JVD or lymphadenopathy is appreciated Neuro: Follows some commands.  Somewhat dull affect. CV: Heart sounds are irregular with atrial fib with a ventricular rate of 124 PULM: Coarse rhonchi bilaterally GI: soft, bsx4 active  Extremities: warm/dry, 1+ edema  Skin: no rashes or lesions 05/23/2019 chest x-ray shows decreased pulmonary edema with proper positioning of support apparatus Labs   Recent Labs  Lab 05/21/19 0342 05/22/19 0326 05/23/19 0405  HGB 10.6* 10.2* 10.0*  HCT 29.8* 30.0* 29.4*  WBC 10.0 9.2 6.3  PLT 122* 96* 84*   Recent Labs  Lab 05/17/19 0802  05/18/19 0615  05/21/19 0342 05/21/19 1500 05/22/19 0326 05/22/19 1842 05/23/19 0405  NA 136   < > 138   < > 144 145 147* 146* 148*  K 3.3*   < > 3.4*   < > 3.3* 3.7 3.8 3.2*  3.0*  CL 106  --  108   < > 108 111 112* 111 110  CO2 15*  --  17*   < > 24 25 24 24 28   GLUCOSE 104*  --  196*   < > 170* 168* 213* 218* 214*  BUN 63*  --  63*   < > 47* 46* 42* 38* 40*  CREATININE 1.90*  --  1.33*   < > 1.29* 1.33* 1.22 1.39* 1.51*  CALCIUM 8.8*  --  8.2*   < > 8.3* 8.3* 8.0* 8.2* 8.1*  MG 2.3  --  2.5*  --  2.2  --  2.2  --  2.0  PHOS  --   --   --   --   --   --  1.9*  --  2.2*   < > = values in this interval not displayed.    Assessment & Plan:   Cardiogenic Shock with questionable bacteremia and sepsis Acute Systolic CHF / Concern for Infective Endocarditis LBBB Aortic Stenosis s/p Bovine AVR, MR CAD s/p CABG, Carotid Artery Disease  Intramural Hematoma over Distal Aortic Arch EF noted to be 25% -concern for possible AV insufficiency with enterococcus bacteremia and CVA P: Currently on Levophed Management per cardiology      Enterococcus Bacteremia P: Continue antibiotics per ID service  Subacute R Frontal Parietal CVA -CTA head neg for LVO P: Neurology's input is appreciated  AFwRVR P:  Currently on amiodarone Per cardiology Return to atrial fibrillation during the night  Vent dependent respiratory failure in the setting of bilateral airspace disease coupled with bilateral pleural effusions.  -RVP negative  P: Vent bundle Antimicrobial therapy per ID for bacteremia with enterococcus     Thrombocytopenia 97->122->92->84 -suspect related to valvular disease + enterococcus bacteremia  P: Continue to monitor specimen while on anticoagulation  AKI Hypokalemia  Recent Labs  Lab 05/22/19 0326 05/22/19 1842 05/23/19 0405  K 3.8 3.2* 3.0*   Lab Results  Component Value Date   CREATININE 1.51 (H) 05/23/2019   CREATININE 1.39 (H) 05/22/2019   CREATININE 1.22 05/22/2019    P: Diuresis per cardiology Continue to monitor electrolytes replete as needed Monitor creatinine  DM CBG (last 3)  Recent Labs    05/23/19 0025 05/23/19 0356 05/23/19 0827  GLUCAP 190* 203* 181*    P:  Continue current sliding scale insulin protocol   At Risk Malnutrition  P:  Continue tube feedings   Best Practice  SUP: Pepcid  DVT:  Lovenox  PAD/Sedation: Fentanyl gtt  Disposition: ICU Family Communication: Updated as per cardiology  App CC Time: 40 min    Brett CanalesSteve Laiden Milles ACNP [facility] Pager [phone] till 1 pm If no answer page [phone] 05/23/2019, 8:37 AM

## 2019-05-23 NOTE — Procedures (Signed)
Cortrak  Person Inserting Tube:  Kambre Messner, RD Tube Type:  Cortrak - 43 inches Tube Location:  Right nare Initial Placement:  Stomach Secured by: Bridle Technique Used to Measure Tube Placement:  Documented cm marking at nare/ corner of mouth Cortrak Secured At:  73 cm    No x-ray is required. RN may begin using tube.   If the tube becomes dislodged please keep the tube and contact the Cortrak team at [email or web address] ([email or web address]) for replacement.  If after hours and replacement cannot be delayed, place a NG tube and confirm placement with an abdominal x-ray.    Shamaine Mulkern RD, LDN Clinical Nutrition Pager # - [phone]    

## 2019-05-23 NOTE — Progress Notes (Signed)
Notified Dr Haroldine Laws of pts persistently low BP and HR in 140s. MD ordered slow 150 mg amiodarone bolus, to turn off milrinone & tube feeds.  If pt does not convert - will cardiovert.

## 2019-05-23 NOTE — Progress Notes (Signed)
Cardiology on call Dr. Marletta Lor was made aware of the patient converting from SR to AFIB with RVR, heart rate 110s-140s. Patient is currently on Levophed and  Milirone. Vital signs are stable. Patient appears to be in no distress. Dr. Marletta Lor recommended trying to wean Levophed drip to see if heart rate decrease and no call her back in an hour if no improvements in heart rate.

## 2019-05-23 NOTE — Progress Notes (Signed)
STROKE TEAM PROGRESS NOTE   INTERVAL HISTORY Patient remains intubated and sedated and is not folowing commands today He remains on multiple drips for hemodynamic support and iv heparin No family at bedside today Vitals:   05/23/19 1215 05/23/19 1230 05/23/19 1245 05/23/19 1300  BP:      Pulse:      Resp: 20 (!) 23 (!) 8 (!) 23  Temp:   99.6 F (37.6 C)   TempSrc:   Oral   SpO2: 96% 96% 95% 94%  Weight:      Height:        CBC:  Recent Labs  Lab 05/22/19 0326 05/23/19 0405  WBC 9.2 6.3  NEUTROABS 7.2 4.6  HGB 10.2* 10.0*  HCT 30.0* 29.4*  MCV 103.4* 104.3*  PLT 96* 84*    Basic Metabolic Panel:  Recent Labs  Lab 05/22/19 0326 05/22/19 1842 05/23/19 0405  NA 147* 146* 148*  K 3.8 3.2* 3.0*  CL 112* 111 110  CO2 24 24 28   GLUCOSE 213* 218* 214*  BUN 42* 38* 40*  CREATININE 1.22 1.39* 1.51*  CALCIUM 8.0* 8.2* 8.1*  MG 2.2  --  2.0  PHOS 1.9*  --  2.2*   Lipid Panel:     Component Value Date/Time   CHOL 80 05/22/2019 1818   TRIG 79 05/22/2019 1818   HDL 15 (L) 05/22/2019 1818   CHOLHDL 5.3 05/22/2019 1818   VLDL 16 05/22/2019 1818   LDLCALC 49 05/22/2019 1818   HgbA1c:  Lab Results  Component Value Date   HGBA1C 5.9 (H) 05/17/2019   Urine Drug Screen:     Component Value Date/Time   LABOPIA NONE DETECTED 05/17/2019 0336   COCAINSCRNUR NONE DETECTED 05/17/2019 0336   LABBENZ NONE DETECTED 05/17/2019 0336   AMPHETMU NONE DETECTED 05/17/2019 0336   THCU NONE DETECTED 05/17/2019 0336   LABBARB NONE DETECTED 05/17/2019 0336    Alcohol Level     Component Value Date/Time   [facility] <10 05/17/2019 0246    IMAGING Mr Brain W Wo Contrast  Result Date: 05/21/2019 CLINICAL DATA:  78 year old male with evidence of right MCA territory infarct on head CT 05/18/2019. No emergent large vessel occlusion on subsequent CTA. EXAM: MRI HEAD WITHOUT AND WITH CONTRAST TECHNIQUE: Multiplanar, multiecho pulse sequences of the brain and surrounding structures were  obtained without and with intravenous contrast. CONTRAST:  9.2mL GADAVIST GADOBUTROL 1 MMOL/ML IV SOLN COMPARISON:  CTA head and neck 05/19/2019. Head CT 05/18/2019. [facility] Brain MRI 03/02/2014 FINDINGS: Brain: There is a 2-3 centimeter confluent infarct in the posterior right MCA territory involving the sensory strip and subcortical white matter. However, there are also numerous small scattered cortical and white matter foci of restricted diffusion in both hemispheres, including anterior and posterior circulation, the right cerebellar hemisphere, and left cerebellar peduncle. The brainstem is spared. There is thalamic and basal ganglia involvement. Associated T2 and FLAIR hyperintensity. Possible petechial hemorrhage (e.g. Series 8, image 71, 79), but no malignant hemorrhagic transformation or intracranial mass effect. Superimposed small chronic infarcts in the left cerebellum which have increased in number since 2015. No midline shift, mass effect, evidence of mass lesion, ventriculomegaly, extra-axial collection or acute intracranial hemorrhage. Cervicomedullary junction and pituitary are within normal limits. No abnormal enhancement identified.  No dural thickening. Vascular: Major intracranial vascular flow voids are preserved. The major dural venous sinuses are enhancing and appear to be patent. Skull and upper cervical spine: Negative visible cervical spine. Visualized bone marrow signal  is within normal limits. Sinuses/Orbits: Postoperative changes to both globes since 2015. Mild paranasal sinus mucosal thickening. Other: Intubated. Fluid in the pharynx. Trace mastoid fluid. Visible internal auditory structures appear normal. IMPRESSION: 1. Numerous small acute infarcts scattered in both cerebral and cerebellar hemispheres in addition to the 2-3 centimeter more confluent posterior right MCA infarcts seen recently by CT. This configuration is consistent with recent embolic event from the  heart or proximal aorta. 2. Possible acute petechial hemorrhage, but no malignant hemorrhagic transformation or intracranial mass effect. 3. Underlying chronic small vessel ischemia, including in the left cerebellum. Electronically Signed   By: Odessa Fleming M.D.   On: 05/21/2019 23:35   Dg Chest Port 1 View  Result Date: 05/23/2019 CLINICAL DATA:  Ventilator patient.  Respiratory failure. EXAM: PORTABLE CHEST 1 VIEW COMPARISON:  Radiographs 05/22/2019 and 05/21/2019. FINDINGS: 0548 hours. The endotracheal tube, left IJ central venous catheter and visualized portions of the nasogastric tube are unchanged. The tip of the nasogastric tube is not visible. There is stable cardiomegaly status post median sternotomy, CABG and valve replacement. The pulmonary edema has slightly improved. There are persistent bilateral pleural effusions and bibasilar atelectasis. No evidence of pneumothorax. IMPRESSION: 1. Mild interval improvement in pulmonary edema consistent with resolving congestive heart failure. 2. Otherwise stable chest with cardiomegaly and bilateral pleural effusions. Stable support system. Electronically Signed   By: Carey Bullocks M.D.   On: 05/23/2019 08:34   Dg Chest Port 1 View  Result Date: 05/22/2019 CLINICAL DATA:  ETT, respiratory failure EXAM: PORTABLE CHEST 1 VIEW COMPARISON:  05/21/2019 FINDINGS: Interval removal of a right neck vascular sheath. Support apparatus is otherwise unchanged including endotracheal tube over the mid trachea, esophagogastric tube with tip and side port below the diaphragm, and left neck vascular catheter. Unchanged diffuse bilateral interstitial pulmonary opacity and small layering bilateral pleural effusions, consistent with edema. Cardiomegaly status post median sternotomy, CABG, aortic valve prosthetic replacement, and aortic valve stent endograft. IMPRESSION: 1. Interval removal of a right neck vascular sheath. Support apparatus is otherwise unchanged including  endotracheal tube over the mid trachea, esophagogastric tube with tip and side port below the diaphragm, and left neck vascular catheter. 2. Unchanged diffuse bilateral interstitial pulmonary opacity and small layering bilateral pleural effusions, consistent with edema. 3. Cardiomegaly status post median sternotomy, CABG, aortic valve prosthetic replacement, and aortic valve stent endograft. Electronically Signed   By: Lauralyn Primes M.D.   On: 05/22/2019 08:32    PHYSICAL EXAM Frail elderly Caucasian male who is intubated and sedated.. . Afebrile. Head is nontraumatic. Neck is supple without bruit.    Cardiac exam no murmur or gallop. Lungs are clear to auscultation. Distal pulses are well felt. Neurological Exam :   patient is sedated intubated .  He is drowsy but can be aroused.  He is not following any commands but  moves right-sided extremities purposefully and left lower extremity to pain.  Dolls eye movements are sluggish.   He has left lower facial weakness.  Tongue midline. . Deep tendon reflexes symmetric.  Sensation is intact bilaterally.  Plantars are downgoing.  Gait not tested.   ASSESSMENT/PLAN Justin Valencia is a 78 y.o. male with history of aortic valve replacement, numbness tingling in hands, mitral valve regurgitation, hypertension, hyperlipidemia, gait disorder, cerebrovascular disease, carotid artery stenosis, atrial fibrillation presenting after fall at home with progressive weakness. Found to be hypotensive w/ low EF with enterococcus bacteremia. CT showed infarct.  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  Patient is also subsequently developed atrial fibrillation during the hospitalization which also puts him at risk for further stroke  CT head acute/subacute R frontal parietal jxn infarct. Small vessel disease.   CTA head & neck no ELVO. Extensive atherosclerosis. High grade stenosis B  cavernous and mod at prox B ICAs. Mild prox L PCA stenosis. Mod stenosis R VA. Intramural hematoma over distal aortic artch. BUL airspace dz w/ B effusions.   CT head w/w/o  And Ct angio brain pending  2D Echo TAVR w/ concern for severe prosthetic valve stenosis. EF 20-25%. atrial fibrillation.   TEE no definite vegetations.  Severe calcification of the mitral valve with stenosis.  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 BP Low on arrival and remains low  Hyperlipidemia  Home meds:  lipitor 10 and lopid 600  LDL pending, goal < 70  Continue statin at discharge  Diabetes type II Controlled  HgbA1c 5.9, goal < 7.0  Other Stroke Risk Factors  Advanced age  Former Cigarette smoker, quit 14 yrs ago  Overweight, Body mass index is 28.23 kg/m., recommend weight loss, diet and exercise as appropriate   Coronary artery disease s/p CABG  B carotid disease  Aortic stenosis w/ AV replacement 2006  ASD w/ closure 2006  Hx MV endocartidits 2013  TAVR 2016  Acute systolic HF  Other Active Problems  AKI  Hypokalemia 3.2  Thrombocytopenia 56-97  LBBB  Hospital day # 6    MRI shows multifocal bilateral bland infarcts and TEE shows no evidence of vegetations and CT angiogram shows no mycotic aneurysms hence recommend starting anticoagulation with IV heparin drip stroke protocol given atrial fibrillation he remains at high risk for further emboli and strokes.Marland Kitchen.He will eventually need transition to anticoagulation with NOAC when able to  swallow or has PEG tube  . Stroke tem will sign off. Call for questions..  This patient is critically ill and at significant risk of neurological worsening, death and care requires constant monitoring of vital signs, hemodynamics,respiratory and cardiac monitoring, extensive review of multiple databases, frequent neurological assessment, discussion with family, other specialists and medical decision making of high complexity.I have made any additions or clarifications directly to the above note.This critical care time does not reflect procedure time, or teaching time or supervisory time of PA/NP/Med Resident etc but could involve care discussion time.  I spent 30 minutes of neurocritical care time  in the care of  this patient.      Delia HeadyPramod Sethi, MD Medical Director [facility] Pager: [phone] 05/23/2019 1:21 PM   To contact Stroke Continuity provider, please refer to [email or web address]. After hours, contact General Neurology

## 2019-05-23 NOTE — Progress Notes (Addendum)
PHARMACY CONSULT NOTE FOR:  OUTPATIENT  PARENTERAL ANTIBIOTIC THERAPY (OPAT)  Indication: Enterococcal bacteremia and CNS septic emboli Regimen: Penicillin G 24 million units continuous infusion  End date: 06/14/2019  IV antibiotic discharge orders are pended. To discharging provider:  please sign these orders via discharge navigator,  Select New Orders & click on the button choice - Manage This Unsigned Work.     Thank you for allowing pharmacy to be a part of this patient's care.  Phillis Haggis 05/23/2019, 9:38 AM

## 2019-05-23 NOTE — Progress Notes (Signed)
Notified Dr Lynetta Mare of pts ABG results. No new orders at this time.

## 2019-05-23 NOTE — Progress Notes (Signed)
Paged Darrick Grinder, NP to notify that pts HR was still irregular and up into 140s.  Levophed increased to 40 mcg/min with arterial line SBP in low 100s.  NP to round on patient.

## 2019-05-23 NOTE — Progress Notes (Signed)
Notified Amy Clegg, NP that pt was in Afib w/RVR, confirmed by EKG, pt symptomatic and BP dropping, Dr Haroldine Laws had discussed placing pt back on drip during rounds this AM. NP to enter orders for amiodarone drip.

## 2019-05-23 NOTE — Progress Notes (Signed)
ANTICOAGULATION CONSULT NOTE - Follow Up Consult  Pharmacy Consult for heparin Indication: atrial fibrillation and stroke  Labs: Recent Labs    05/21/19 0342  05/22/19 0326 05/22/19 1818 05/22/19 1842 05/23/19 0405  HGB 10.6*  --  10.2*  --   --  10.0*  HCT 29.8*  --  30.0*  --   --  29.4*  PLT 122*  --  96*  --   --  84*  HEPARINUNFRC  --   --   --  0.24*  --  0.45  CREATININE 1.29*   < > 1.22  --  1.39* 1.51*   < > = values in this interval not displayed.    Assessment/Plan:  78yo male therapeutic on heparin after rate change. Will continue gtt at current rate and confirm stable with additional level.   Wynona Neat, PharmD, BCPS  05/23/2019,5:59 AM

## 2019-05-23 NOTE — Progress Notes (Signed)
ANTICOAGULATION CONSULT NOTE - Follow Up Consult  Pharmacy Consult for Heparin Indication: atrial fibrillation  Allergies  Allergen Reactions  . Sulfamethoxazole Rash    [facility]    Patient Measurements: Height: 5\' 11"  (180.3 cm) Weight: 202 lb 6.1 oz (91.8 kg) IBW/kg (Calculated) : 75.3  Vital Signs: Temp: 99.6 F (37.6 C) (09/18 1245) Temp Source: Oral (09/18 1245) BP: 107/55 (09/18 1550) Pulse Rate: 123 (09/18 1550)  Heparin dosing weight: 94.4 kg  Labs: Recent Labs    05/21/19 0342  05/22/19 0326 05/22/19 1818 05/22/19 1842 05/23/19 0405 05/23/19 1222 05/23/19 1250  HGB 10.6*  --  10.2*  --   --  10.0*  --  10.5*  HCT 29.8*  --  30.0*  --   --  29.4*  --  31.0*  PLT 122*  --  96*  --   --  84*  --   --   HEPARINUNFRC  --   --   --  0.24*  --  0.45 0.64  --   CREATININE 1.29*   < > 1.22  --  1.39* 1.51*  --   --    < > = values in this interval not displayed.    Estimated Creatinine Clearance: 49 mL/min (A) (by C-G formula based on SCr of 1.51 mg/dL (H)).   Medical History: Past Medical History:  Diagnosis Date  . Aortic stenosis    Aortic valve replacement 2006  . ASD secundum    Closure, October, 2006  . Atrial fibrillation (HCC)    Postoperative, previously treated with amiodarone and Coumadin, then stop  . Bilateral carotid artery disease (HCC)   . CAD, multiple vessel    Stress echo, January , 2012, no ischemia  . Carotid artery disease (HCC)    Bilateral, followed closely by Dr. Imogene Burnhen ([facility]), February, 2012  . Cerebrovascular disease 12/04/2014  . Chest wall pain    October, 2012  . Chronic back pain    And sternal  . Diabetes mellitus type II   . Ejection fraction    EF, . 60-65%,echo, January, 2012  . 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 Hx of CABG    2006, aortic valve replacement, ASD closure  . Hyperlipidemia    Abnormal LFTs in the past, Lipitor discontinued, followed by Dr. Dimas AguasHoward he  . Hypertension   . Mitral  regurgitation    Echo, January, 2012  . Nephrolithiasis   . Numbness and tingling in hands    October, 2012  . S/P aortic valve replacement    2006, bovine pericardial tissue valve /   , Normal function, echo, January, 2012    Medications:  Medications Prior to Admission  Medication Sig Dispense Refill Last Dose  . acetaminophen (TYLENOL) 500 MG tablet Take 1,000 mg by mouth every 6 (six) hours as needed for headache (pain).   05/15/2019 at am  . amLODipine (NORVASC) 5 MG tablet Take 5 mg by mouth daily.   05/15/2019 at am  . aspirin EC 81 MG tablet Take 81 mg by mouth at bedtime.   unknown  . atorvastatin (LIPITOR) 10 MG tablet Take 10 mg by mouth at bedtime.    unknown  . gemfibrozil (LOPID) 600 MG tablet Take 600 mg by mouth 2 (two) times daily.     05/21/2019 at am  . glipiZIDE (GLUCOTROL XL) 10 MG 24 hr tablet Take 10 mg by mouth daily with breakfast.   05/15/2019 at am  . Magnesium  250 MG TABS Take 250 mg by mouth at bedtime.    unknown  . Multiple Vitamin (MULTIVITAMIN WITH MINERALS) TABS tablet Take 1 tablet by mouth daily.   05/30/2019 at am  . OVER THE COUNTER MEDICATION Take 1 tablet by mouth at bedtime. Zinc 30 mg/ copper 2 mg   unknown    Assessment: 78 y.o. male admitted on 9/11 with weakness and fall at home. On admit, he was dyspnic, hypotensive, and found to have new Afib with no anticoagulation PTA. Pt also found to have EF of 20-25% on ECHO. He developed septic shock due to enterococcus bacteremia. TEE was negative for vegetations. Heparin was initially started but held due to risk of hemorrhage from septic embolism. Due to a lack of vegetations seen, pharmacy consulted to restart heparin. 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   Plts are low at 84 . Hg/Hct are stable at 10.5/31. Heparin level this afternoon is above goal at 0.64 on heparin rate of 1,500 units/hr. No issues with heparin infusion or bleeding noted per RN.  Goal  of Therapy:  Heparin level 0.3-0.5 units/ml Monitor platelets by anticoagulation protocol: Yes   Plan:  Reduce heparin infusion to 1,350 units/hr  Check heparin level in 6 hr from dose change Monitor daily Heparin Level, CBC Watch for signs/symptoms of bleeding  Sherren Kerns, PharmD PGY1 Acute Care Pharmacy Resident [phone] 05/23/2019 4:09 PM

## 2019-05-23 NOTE — Progress Notes (Signed)
Dr Lynetta Mare rounding on unit, notified of pts Afib with RVR and Dr Gillermina Hu recommendations.  MD to enter orders.

## 2019-05-23 NOTE — Progress Notes (Signed)
Advanced Heart Failure Rounding Note   Subjective:    Remains intubated. Failed SBT.    In/out of AF with RVR throughout the day finally broke after several amio boluses. Heparin restarted by Neuro as they feel brain infarcts less likely to be septic emboli.   Had fevers again today so ID switched abx back to amp/ceftriazone (amp previously switched to vanc to reduce salt load)  Milrinone stopped this am due to hypotension. Now on NE 25-30. Co-ox 64. CVP 12-13. Remains on IV lasix   TEE 9/16 EF 25% with severe RV dysfunction. Severe MS. No obvious vegetation. Very heavy aortic plaquing  Objective:   Weight Range:  Vital Signs:   Temp:  [97.8 F (36.6 C)-101.8 F (38.8 C)] 98.8 F (37.1 C) (09/18 1946) Pulse Rate:  [63-123] 66 (09/18 2100) Resp:  [0-39] 20 (09/18 2100) BP: (79-120)/(45-100) 107/55 (09/18 1550) SpO2:  [90 %-100 %] 91 % (09/18 2100) Arterial Line BP: (78-144)/(32-68) 116/46 (09/18 2100) FiO2 (%):  [40 %] 40 % (09/18 2000) Weight:  [91.8 kg] 91.8 kg (09/18 0440) Last BM Date: 05/23/19  Weight change: Filed Weights   05/22/19 0340 05/22/19 1000 05/23/19 0440  Weight: 95 kg 95 kg 91.8 kg    Intake/Output:   Intake/Output Summary (Last 24 hours) at 05/23/2019 2118 Last data filed at 05/23/2019 2100 Gross per 24 hour  Intake 4922.61 ml  Output 2750 ml  Net 2172.61 ml     Physical Exam: CVP 12-13 General:  Intubated agitated at times HEENT: normal + ETT Neck: supple. JVP elevated LIJ TLC. Carotids 2+ bilat; no bruits. No lymphadenopathy or thryomegaly appreciated. Cor: PMI nondisplaced. Irregular tachy Lungs: +crackles Abdomen: soft, nontender, nondistended. No hepatosplenomegaly. No bruits or masses. Good bowel sounds. Extremities: no cyanosis, clubbing, rash, tr edema Neuro: intubated agitated at times. Follows some commands when awake   Telemetry: AF 120-130s Personally reviewed    Labs: Basic Metabolic Panel: Recent Labs  Lab  05/17/19 0802  05/18/19 0615  05/21/19 0342 05/21/19 1500 05/22/19 0326 05/22/19 1842 05/23/19 0405 05/23/19 1250  NA 136   < > 138   < > 144 145 147* 146* 148* 148*  K 3.3*   < > 3.4*   < > 3.3* 3.7 3.8 3.2* 3.0* 3.9  CL 106  --  108   < > 108 111 112* 111 110  --   CO2 15*  --  17*   < > '24 25 24 24 28  ' --   GLUCOSE 104*  --  196*   < > 170* 168* 213* 218* 214*  --   BUN 63*  --  63*   < > 47* 46* 42* 38* 40*  --   CREATININE 1.90*  --  1.33*   < > 1.29* 1.33* 1.22 1.39* 1.51*  --   CALCIUM 8.8*  --  8.2*   < > 8.3* 8.3* 8.0* 8.2* 8.1*  --   MG 2.3  --  2.5*  --  2.2  --  2.2  --  2.0  --   PHOS  --   --   --   --   --   --  1.9*  --  2.2*  --    < > = values in this interval not displayed.    Liver Function Tests: Recent Labs  Lab 05/17/19 0802 05/18/19 0615 05/19/19 0430 05/20/19 0443  AST 156* 138* 95* 58*  ALT 65* 76* 69* 57*  ALKPHOS 56  66 53 64  BILITOT 1.6* 1.3* 1.3* 1.0  PROT 6.4* 6.0* 5.9* 5.9*  ALBUMIN 2.9* 2.5* 2.5* 2.4*   No results for input(s): LIPASE, AMYLASE in the last 168 hours. No results for input(s): AMMONIA in the last 168 hours.  CBC: Recent Labs  Lab 05/19/19 0430  05/20/19 0443  05/20/19 1703 05/21/19 0342 05/22/19 0326 05/23/19 0405 05/23/19 1250  WBC 8.2  --  9.1  --   --  10.0 9.2 6.3  --   NEUTROABS  --   --   --   --   --   --  7.2 4.6  --   HGB 11.3*   < > 11.2*   < > 11.2* 10.6* 10.2* 10.0* 10.5*  HCT 32.2*   < > 31.3*   < > 33.0* 29.8* 30.0* 29.4* 31.0*  MCV 99.7  --  101.0*  --   --  101.4* 103.4* 104.3*  --   PLT 56*  --  97*  --   --  122* 96* 84*  --    < > = values in this interval not displayed.    Cardiac Enzymes: No results for input(s): CKTOTAL, CKMB, CKMBINDEX, TROPONINI in the last 168 hours.  BNP: BNP (last 3 results) No results for input(s): BNP in the last 8760 hours.  ProBNP (last 3 results) No results for input(s): PROBNP in the last 8760 hours.    Other results:  Imaging: Mr Jeri Cos Wo  Contrast  Result Date: 05/21/2019 CLINICAL DATA:  78 year old male with evidence of right MCA territory infarct on head CT 05/18/2019. No emergent large vessel occlusion on subsequent CTA. EXAM: MRI HEAD WITHOUT AND WITH CONTRAST TECHNIQUE: Multiplanar, multiecho pulse sequences of the brain and surrounding structures were obtained without and with intravenous contrast. CONTRAST:  9.57m GADAVIST GADOBUTROL 1 MMOL/ML IV SOLN COMPARISON:  CTA head and neck 05/19/2019. Head CT [facility]/18/2019. [facility] FINDINGS: Brain: There is a 2-3 centimeter confluent infarct in the posterior right MCA territory involving the sensory strip and subcortical white matter. However, there are also numerous small scattered cortical and white matter foci of restricted diffusion in both hemispheres, including anterior and posterior circulation, the right cerebellar hemisphere, and left cerebellar peduncle. The brainstem is spared. There is thalamic and basal ganglia involvement. Associated T2 and FLAIR hyperintensity. Possible petechial hemorrhage (e.g. Series 8, image 71, 79), but no malignant hemorrhagic transformation or intracranial mass effect. Superimposed small chronic infarcts in the left cerebellum which have increased in number since 2015. No midline shift, mass effect, evidence of mass lesion, ventriculomegaly, extra-axial collection or acute intracranial hemorrhage. Cervicomedullary junction and pituitary are within normal limits. No abnormal enhancement identified.  No dural thickening. Vascular: Major intracranial vascular flow voids are preserved. The major dural venous sinuses are enhancing and appear to be patent. Skull and upper cervical spine: Negative visible cervical spine. Visualized bone marrow signal is within normal limits. Sinuses/Orbits: Postoperative changes to both globes since 2015. Mild paranasal sinus mucosal thickening. Other: Intubated. Fluid in the pharynx. Trace mastoid  fluid. Visible internal auditory structures appear normal. IMPRESSION: 1. Numerous small acute infarcts scattered in both cerebral and cerebellar hemispheres in addition to the 2-3 centimeter more confluent posterior right MCA infarcts seen recently by CT. This configuration is consistent with recent embolic event from the heart or proximal aorta. 2. Possible acute petechial hemorrhage, but no malignant hemorrhagic transformation or intracranial mass effect. 3. Underlying chronic small vessel ischemia, including in the  left cerebellum. Electronically Signed   By: Genevie Ann M.D.   On: 05/21/2019 23:35   Dg Chest Port 1 View  Result Date: 05/23/2019 CLINICAL DATA:  Ventilator patient.  Respiratory failure. EXAM: PORTABLE CHEST 1 VIEW COMPARISON:  Radiographs 05/22/2019 and 05/21/2019. FINDINGS: 0548 hours. The endotracheal tube, left IJ central venous catheter and visualized portions of the nasogastric tube are unchanged. The tip of the nasogastric tube is not visible. There is stable cardiomegaly status post median sternotomy, CABG and valve replacement. The pulmonary edema has slightly improved. There are persistent bilateral pleural effusions and bibasilar atelectasis. No evidence of pneumothorax. IMPRESSION: 1. Mild interval improvement in pulmonary edema consistent with resolving congestive heart failure. 2. Otherwise stable chest with cardiomegaly and bilateral pleural effusions. Stable support system. Electronically Signed   By: Richardean Sale M.D.   On: 05/23/2019 08:34   Dg Chest Port 1 View  Result Date: 05/22/2019 CLINICAL DATA:  ETT, respiratory failure EXAM: PORTABLE CHEST 1 VIEW COMPARISON:  05/21/2019 FINDINGS: Interval removal of a right neck vascular sheath. Support apparatus is otherwise unchanged including endotracheal tube over the mid trachea, esophagogastric tube with tip and side port below the diaphragm, and left neck vascular catheter. Unchanged diffuse bilateral interstitial pulmonary  opacity and small layering bilateral pleural effusions, consistent with edema. Cardiomegaly status post median sternotomy, CABG, aortic valve prosthetic replacement, and aortic valve stent endograft. IMPRESSION: 1. Interval removal of a right neck vascular sheath. Support apparatus is otherwise unchanged including endotracheal tube over the mid trachea, esophagogastric tube with tip and side port below the diaphragm, and left neck vascular catheter. 2. Unchanged diffuse bilateral interstitial pulmonary opacity and small layering bilateral pleural effusions, consistent with edema. 3. Cardiomegaly status post median sternotomy, CABG, aortic valve prosthetic replacement, and aortic valve stent endograft. Electronically Signed   By: Eddie Candle M.D.   On: 05/22/2019 08:32     Medications:     Scheduled Medications:  aspirin  81 mg Per Tube Daily   atorvastatin  10 mg Per Tube q1800   chlorhexidine gluconate (MEDLINE KIT)  15 mL Mouth Rinse BID   Chlorhexidine Gluconate Cloth  6 each Topical Daily   feeding supplement (PRO-STAT SUGAR FREE 64)  30 mL Per Tube TID   insulin aspart  0-20 Units Subcutaneous Q4H   mouth rinse  15 mL Mouth Rinse 10 times per day   pantoprazole (PROTONIX) IV  40 mg Intravenous Daily   potassium chloride  40 mEq Oral BID   QUEtiapine  25 mg Per Tube BID   sodium chloride flush  10-40 mL Intracatheter Q12H    Infusions:  sodium chloride 10 mL/hr at 05/23/19 2100   sodium chloride Stopped (05/19/19 1914)   amiodarone 60 mg/hr (05/23/19 2100)   ampicillin (OMNIPEN) IV Stopped (05/23/19 2013)   cefTRIAXone (ROCEPHIN)  IV Stopped (05/23/19 1809)   feeding supplement (VITAL AF 1.2 CAL) 55 mL/hr at 05/22/19 2100   fentaNYL infusion INTRAVENOUS 25 mcg/hr (05/23/19 2100)   heparin 1,350 Units/hr (05/23/19 2100)   milrinone Stopped (05/23/19 1250)   norepinephrine (LEVOPHED) Adult infusion 28 mcg/min (05/23/19 2100)   vasopressin (PITRESSIN) infusion  - *FOR SHOCK* 0.03 Units/min (05/23/19 2100)    PRN Medications: sodium chloride, acetaminophen, acetaminophen, fentaNYL, influenza vaccine adjuvanted, sennosides, sodium chloride flush   Assessment/Plan:   1. Shock - Suspect combination of cardiogenic and septic shock - EF newly down 55-60% in 7/20 -> 25% now. Unclear etiology. Trop moderately up but flat. ? AF vs septic  CM - Initial swan numbers with low output and co-ox 44% - Initial Bcx 2/2 enterococcus facaaelis - TEE 9/16 EF 25%  Moderate prosthetic AS/severe MS. No vegetation  - Hemodynamics improved with inotrope support but subsequently deteriorated - Milrinone now off. On NE. - ABx switched back to amp/ceftiaxone on 9/18 - Wean NE as tolerated  2. Acute systolic HF - EF 27% see discussion above - CXR with improving edema - Continue diuresis - Wean NE as tolerated. - Follow CVP and co-ox  3. New onset AF with RVR  - In/out AF today - continue amio  - Heparin started  4. 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 Recommend 6 weeks IV abx - Doubt he is surgical candidate so options very limiete  5. Neuro - he remains encephelopathic. Unclear how much is metabolic vs related to CVA - MRI with diffuse infarcts - ? Septic vs plaque - TEE 9/16 with no clear vegetation. Very heavy aortic plaque in descending aorta on TEE - Neuro saw felt less likely to be septic. Heparin restarted  6. Acute hypoxic respiratory failure - intubated 9/16. CCM following (thanks)  - CXR with improving pulmonary edema - continue IV lasix  7. CAD s/p CABG - trop up but doubt ACS as it is flat - likely no coronary angio this admit with strokes and other issues  8. Valvular disease  - TEE  suggests severe MS. Likely due to calcifications no clear vegetation or thrombosis. Not surgical or MV-plasty candidate currently - Aortic stenosis (s/p SAVR 2006, s/p TAVR 2016) gradients suggestive of moderate AS  9. Prognosis - he has multi-system issues with severe HF/cardiogenic shock with low EF and severe valvular disease as well as multiple embolic CVAs, fraility  - Prognosis extremely concerning.  - Continue supportive care for now - Consider starting [place] discussions with family and Palliative team. I have d/w wife   CRITICAL CARE Performed by: Glori Bickers  Total critical care time: 45 minutes  Critical care time was exclusive of separately billable procedures and treating other patients.  Critical care was necessary to treat or prevent imminent or life-threatening deterioration.  Critical care was time spent personally by me (independent of midlevel providers or residents) on the following activities: development of treatment plan with patient and/or surrogate as well as nursing, discussions with consultants, evaluation of patient's response to treatment, examination of patient, obtaining history from patient or surrogate, ordering and performing treatments and interventions, ordering and review of laboratory studies, ordering and review of radiographic studies, pulse oximetry and re-evaluation of patient's condition.    Glori Bickers, MD  9:18 PM

## 2019-05-24 ENCOUNTER — Inpatient Hospital Stay (HOSPITAL_COMMUNITY): Payer: Medicare HMO

## 2019-05-24 DIAGNOSIS — I6340 Cerebral infarction due to embolism of unspecified cerebral artery: Secondary | ICD-10-CM

## 2019-05-24 LAB — BASIC METABOLIC PANEL
Anion gap: 11 (ref 5–15)
BUN: 43 mg/dL — ABNORMAL HIGH (ref 8–23)
CO2: 23 mmol/L (ref 22–32)
Calcium: 7.8 mg/dL — ABNORMAL LOW (ref 8.9–10.3)
Chloride: 109 mmol/L (ref 98–111)
Creatinine, Ser: 1.36 mg/dL — ABNORMAL HIGH (ref 0.61–1.24)
GFR calc Af Amer: 59 mL/min — ABNORMAL LOW (ref >60)
GFR calc non Af Amer: 51 mL/min — ABNORMAL LOW (ref >60)
Glucose, Bld: 274 mg/dL — ABNORMAL HIGH (ref 70–99)
Potassium: 3.5 mmol/L (ref 3.5–5.1)
Sodium: 143 mmol/L (ref 135–145)

## 2019-05-24 LAB — COOXEMETRY PANEL
Carboxyhemoglobin: 0.9 % (ref 0.5–1.5)
Carboxyhemoglobin: 1.3 % (ref 0.5–1.5)
Methemoglobin: 0.7 % (ref 0.0–1.5)
Methemoglobin: 1.1 % (ref 0.0–1.5)
O2 Saturation: 46.4 %
O2 Saturation: 69.5 %
Total hemoglobin: 11.7 g/dL — ABNORMAL LOW (ref 12.0–16.0)
Total hemoglobin: 10.7 g/dL — ABNORMAL LOW (ref 12.0–16.0)

## 2019-05-24 LAB — CBC WITH DIFFERENTIAL/PLATELET
Abs Immature Granulocytes: 0.13 10*3/uL — ABNORMAL HIGH (ref 0.00–0.07)
Basophils Absolute: 0.1 10*3/uL (ref 0.0–0.1)
Basophils Relative: 1 %
Eosinophils Absolute: 0.1 10*3/uL (ref 0.0–0.5)
Eosinophils Relative: 2 %
HCT: 30.9 % — ABNORMAL LOW (ref 39.0–52.0)
Hemoglobin: 10.2 g/dL — ABNORMAL LOW (ref 13.0–17.0)
Immature Granulocytes: 2 %
Lymphocytes Relative: 21 %
Lymphs Abs: 1.8 10*3/uL (ref 0.7–4.0)
MCH: 34.7 pg — ABNORMAL HIGH (ref 26.0–34.0)
MCHC: 33 g/dL (ref 30.0–36.0)
MCV: 105.1 fL — ABNORMAL HIGH (ref 80.0–100.0)
Monocytes Absolute: 0.2 10*3/uL (ref 0.1–1.0)
Monocytes Relative: 3 %
Neutro Abs: 6 10*3/uL (ref 1.7–7.7)
Neutrophils Relative %: 71 %
Platelets: 99 10*3/uL — ABNORMAL LOW (ref 150–400)
RBC: 2.94 MIL/uL — ABNORMAL LOW (ref 4.22–5.81)
RDW: 16.7 % — ABNORMAL HIGH (ref 11.5–15.5)
WBC: 8.4 10*3/uL (ref 4.0–10.5)
nRBC: 1.9 % — ABNORMAL HIGH (ref 0.0–0.2)

## 2019-05-24 LAB — POCT I-STAT 7, (LYTES, BLD GAS, ICA,H+H)
Acid-Base Excess: 2 mmol/L (ref 0.0–2.0)
Bicarbonate: 25.3 mmol/L (ref 20.0–28.0)
Calcium, Ion: 1.14 mmol/L — ABNORMAL LOW (ref 1.15–1.40)
HCT: 31 % — ABNORMAL LOW (ref 39.0–52.0)
Hemoglobin: 10.5 g/dL — ABNORMAL LOW (ref 13.0–17.0)
O2 Saturation: 88 %
Patient temperature: 100.3
Potassium: 3.5 mmol/L (ref 3.5–5.1)
Sodium: 148 mmol/L — ABNORMAL HIGH (ref 135–145)
TCO2: 26 mmol/L (ref 22–32)
pCO2 arterial: 36.6 mmHg (ref 32.0–48.0)
pH, Arterial: 7.45 (ref 7.350–7.450)
pO2, Arterial: 55 mmHg — ABNORMAL LOW (ref 83.0–108.0)

## 2019-05-24 LAB — MAGNESIUM: Magnesium: 2.2 mg/dL (ref 1.7–2.4)

## 2019-05-24 LAB — GLUCOSE, CAPILLARY
Glucose-Capillary: 179 mg/dL — ABNORMAL HIGH (ref 70–99)
Glucose-Capillary: 201 mg/dL — ABNORMAL HIGH (ref 70–99)
Glucose-Capillary: 211 mg/dL — ABNORMAL HIGH (ref 70–99)
Glucose-Capillary: 248 mg/dL — ABNORMAL HIGH (ref 70–99)
Glucose-Capillary: 254 mg/dL — ABNORMAL HIGH (ref 70–99)
Glucose-Capillary: 270 mg/dL — ABNORMAL HIGH (ref 70–99)
Glucose-Capillary: 288 mg/dL — ABNORMAL HIGH (ref 70–99)

## 2019-05-24 LAB — HEPARIN LEVEL (UNFRACTIONATED)
Heparin Unfractionated: 0.58 IU/mL (ref 0.30–0.70)
Heparin Unfractionated: 0.47 IU/mL (ref 0.30–0.70)

## 2019-05-24 LAB — PHOSPHORUS: Phosphorus: 2.7 mg/dL (ref 2.5–4.6)

## 2019-05-24 MED ORDER — POTASSIUM CHLORIDE 20 MEQ/15ML (10%) PO SOLN
40.0000 meq | Freq: Three times a day (TID) | ORAL | Status: AC
  Administered 2019-05-24 (×2): 40 meq
  Filled 2019-05-24 (×2): qty 30

## 2019-05-24 MED ORDER — MILRINONE LACTATE IN DEXTROSE 20-5 MG/100ML-% IV SOLN
0.1250 ug/kg/min | INTRAVENOUS | Status: DC
  Administered 2019-05-24 – 2019-05-26 (×3): 0.125 ug/kg/min via INTRAVENOUS
  Filled 2019-05-24 (×3): qty 100

## 2019-05-24 MED ORDER — FUROSEMIDE 10 MG/ML IJ SOLN
80.0000 mg | Freq: Two times a day (BID) | INTRAMUSCULAR | Status: DC
  Administered 2019-05-24 – 2019-05-25 (×3): 80 mg via INTRAVENOUS
  Filled 2019-05-24 (×3): qty 8

## 2019-05-24 MED ORDER — POTASSIUM CHLORIDE 10 MEQ/50ML IV SOLN
10.0000 meq | INTRAVENOUS | Status: AC
  Administered 2019-05-24 (×4): 10 meq via INTRAVENOUS
  Filled 2019-05-24 (×4): qty 50

## 2019-05-24 NOTE — Progress Notes (Signed)
ANTICOAGULATION CONSULT NOTE - Follow Up Consult  Pharmacy Consult for Heparin Indication: atrial fibrillation  Allergies  Allergen Reactions  . Sulfamethoxazole Rash    Reported by [facility] in 2016    Patient Measurements: Height: 5\' 11"  (180.3 cm) Weight: 211 lb 3.2 oz (95.8 kg) IBW/kg (Calculated) : 75.3  Vital Signs: Temp: 99.6 F (37.6 C) (09/19 1511) Temp Source: Oral (09/19 1511) BP: 96/59 (09/19 1630) Pulse Rate: 76 (09/19 1815)  Heparin dosing weight: 94.4 kg  Labs: Recent Labs    05/22/19 0326  05/22/19 1842 05/23/19 0405  05/23/19 1250 05/23/19 2217 05/24/19 0406 05/24/19 0417 05/24/19 0656 05/24/19 1800  HGB 10.2*  --   --  10.0*  --  10.5*  --  10.2* 10.5*  --   --   HCT 30.0*  --   --  29.4*  --  31.0*  --  30.9* 31.0*  --   --   PLT 96*  --   --  84*  --   --   --  99*  --   --   --   HEPARINUNFRC  --    < >  --  0.45   < >  --  0.14*  --   --  0.58 0.47  CREATININE 1.22  --  1.39* 1.51*  --   --   --  1.36*  --   --   --    < > = values in this interval not displayed.    Estimated Creatinine Clearance: 55.4 mL/min (A) (by C-G formula based on SCr of 1.36 mg/dL (H)).  Assessment: 78 y.o. male admitted on 9/11 with weakness and fall at home. On admit, he was dyspnic, hypotensive, and found to have new Afib with no anticoagulation PTA. Pt also found to have EF of 20-25% on ECHO. He developed septic shock due to enterococcus bacteremia. TEE was negative for vegetations. Heparin was initially started but held due to risk of hemorrhage from septic embolism. Due to a lack of vegetations seen, pharmacy consulted to restart heparin. 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   Heparin level is now therapeutic. No bleeding noted.   Goal of Therapy:  Heparin level 0.3-0.5 units/ml Monitor platelets by anticoagulation protocol: Yes   Plan:  Continue heparin infusion 1350 units/hr F/u AM heparin level to  confirm dosing Daily heparin level and CBC  Salome Arnt, PharmD, BCPS Please see AMION for all pharmacy numbers 05/24/2019 7:21 PM

## 2019-05-24 NOTE — Progress Notes (Signed)
Patient ID: Justin Valencia, male   DOB: 1941/05/24, 78 y.o.   MRN: [id number]    Advanced Heart Failure Rounding Note   Subjective:    Remains intubated. Failed SBT.   He has been in NSR overnight on IV amiodarone.  NSR this morning.   Temp 100.3 overnight, remains on amp/ceftriazone.   Excellent BP overnight, weaning down on NE.  Remains on vasopressin 0.03.  Co-ox 46% off milrinone.   Off Lasix, I/Os positive with CVP 15-16.   He moves his arms and legs but not purposefully.    TEE 9/16 EF 25% with severe RV dysfunction. Severe MS. No obvious vegetation. Very heavy aortic plaquing.  S/p TAVR with moderate aortic stenosis.   Objective:   Weight Range:  Vital Signs:   Temp:  [97.9 F (36.6 C)-101.8 F (38.8 C)] 98.8 F (37.1 C) (09/19 0723) Pulse Rate:  [56-123] 56 (09/19 0715) Resp:  [0-30] 23 (09/19 0715) BP: (89-152)/(50-70) 152/70 (09/19 0723) SpO2:  [89 %-99 %] 95 % (09/19 0723) Arterial Line BP: (79-144)/(32-68) 121/50 (09/19 0715) FiO2 (%):  [40 %-100 %] 90 % (09/19 0723) Weight:  [95.8 kg] 95.8 kg (09/19 0500) Last BM Date: 05/23/19  Weight change: Filed Weights   05/22/19 1000 05/23/19 0440 05/24/19 0500  Weight: 95 kg 91.8 kg 95.8 kg    Intake/Output:   Intake/Output Summary (Last 24 hours) at 05/24/2019 0832 Last data filed at 05/24/2019 0700 Gross per 24 hour  Intake 5837.94 ml  Output 2125 ml  Net 3712.94 ml     Physical Exam: CVP 15-16 General: Intubated/sedated Neck: JVP 14+, no thyromegaly or thyroid nodule.  Lungs: Decreased BS at bases.  CV: Nonpalpable.  Heart regular S1/S2, no S3/S4, 2/6 SEM RUSB.  Trace ankle edema.  Abdomen: Soft, nontender, no hepatosplenomegaly, no distention.  Skin: Intact without lesions or rashes.  Neurologic: Sedated, per nursing will non-purposefully move arms/legs.  Extremities: No clubbing or cyanosis.  HEENT: Normal.   Telemetry: NSR 60s Personally reviewed    Labs: Basic Metabolic Panel: Recent  Labs  Lab 05/18/19 0615  05/21/19 0342 05/21/19 1500 05/22/19 0326 05/22/19 1842 05/23/19 0405 05/23/19 1250 05/24/19 0406 05/24/19 0417  NA 138   < > 144 145 147* 146* 148* 148* 143 148*  K 3.4*   < > 3.3* 3.7 3.8 3.2* 3.0* 3.9 3.5 3.5  CL 108   < > 108 111 112* 111 110  --  109  --   CO2 17*   < > '24 25 24 24 28  ' --  23  --   GLUCOSE 196*   < > 170* 168* 213* 218* 214*  --  274*  --   BUN 63*   < > 47* 46* 42* 38* 40*  --  43*  --   CREATININE 1.33*   < > 1.29* 1.33* 1.22 1.39* 1.51*  --  1.36*  --   CALCIUM 8.2*   < > 8.3* 8.3* 8.0* 8.2* 8.1*  --  7.8*  --   MG 2.5*  --  2.2  --  2.2  --  2.0  --  2.2  --   PHOS  --   --   --   --  1.9*  --  2.2*  --  2.7  --    < > = values in this interval not displayed.    Liver Function Tests: Recent Labs  Lab 05/18/19 0615 05/19/19 0430 05/20/19 0443  AST 138* 95* 58*  ALT  76* 69* 57*  ALKPHOS 66 53 64  BILITOT 1.3* 1.3* 1.0  PROT 6.0* 5.9* 5.9*  ALBUMIN 2.5* 2.5* 2.4*   No results for input(s): LIPASE, AMYLASE in the last 168 hours. No results for input(s): AMMONIA in the last 168 hours.  CBC: Recent Labs  Lab 05/20/19 0443  05/21/19 0342 05/22/19 0326 05/23/19 0405 05/23/19 1250 05/24/19 0406 05/24/19 0417  WBC 9.1  --  10.0 9.2 6.3  --  8.4  --   NEUTROABS  --   --   --  7.2 4.6  --  6.0  --   HGB 11.2*   < > 10.6* 10.2* 10.0* 10.5* 10.2* 10.5*  HCT 31.3*   < > 29.8* 30.0* 29.4* 31.0* 30.9* 31.0*  MCV 101.0*  --  101.4* 103.4* 104.3*  --  105.1*  --   PLT 97*  --  122* 96* 84*  --  99*  --    < > = values in this interval not displayed.    Cardiac Enzymes: No results for input(s): CKTOTAL, CKMB, CKMBINDEX, TROPONINI in the last 168 hours.  BNP: BNP (last 3 results) No results for input(s): BNP in the last 8760 hours.  ProBNP (last 3 results) No results for input(s): PROBNP in the last 8760 hours.    Other results:  Imaging: Dg Chest Port 1 View  Result Date: 05/23/2019 CLINICAL DATA:  Ventilator  patient.  Respiratory failure. EXAM: PORTABLE CHEST 1 VIEW COMPARISON:  Radiographs 05/22/2019 and 05/21/2019. FINDINGS: 0548 hours. The endotracheal tube, left IJ central venous catheter and visualized portions of the nasogastric tube are unchanged. The tip of the nasogastric tube is not visible. There is stable cardiomegaly status post median sternotomy, CABG and valve replacement. The pulmonary edema has slightly improved. There are persistent bilateral pleural effusions and bibasilar atelectasis. No evidence of pneumothorax. IMPRESSION: 1. Mild interval improvement in pulmonary edema consistent with resolving congestive heart failure. 2. Otherwise stable chest with cardiomegaly and bilateral pleural effusions. Stable support system. Electronically Signed   By: Richardean Sale M.D.   On: 05/23/2019 08:34     Medications:     Scheduled Medications: . aspirin  81 mg Per Tube Daily  . atorvastatin  10 mg Per Tube q1800  . chlorhexidine gluconate (MEDLINE KIT)  15 mL Mouth Rinse BID  . Chlorhexidine Gluconate Cloth  6 each Topical Daily  . feeding supplement (PRO-STAT SUGAR FREE 64)  30 mL Per Tube TID  . furosemide  80 mg Intravenous BID  . insulin aspart  0-20 Units Subcutaneous Q4H  . mouth rinse  15 mL Mouth Rinse 10 times per day  . pantoprazole (PROTONIX) IV  40 mg Intravenous Daily  . potassium chloride  40 mEq Oral BID  . QUEtiapine  25 mg Per Tube BID  . sodium chloride flush  10-40 mL Intracatheter Q12H    Infusions: . sodium chloride 10 mL/hr at 05/24/19 0700  . sodium chloride Stopped (05/19/19 1914)  . amiodarone 60 mg/hr (05/24/19 0700)  . ampicillin (OMNIPEN) IV Stopped (05/24/19 0427)  . cefTRIAXone (ROCEPHIN)  IV Stopped (05/23/19 1809)  . feeding supplement (VITAL AF 1.2 CAL) 55 mL/hr at 05/22/19 2100  . fentaNYL infusion INTRAVENOUS 75 mcg/hr (05/24/19 0700)  . heparin 1,450 Units/hr (05/24/19 0700)  . milrinone Stopped (05/23/19 1250)  . milrinone    .  norepinephrine (LEVOPHED) Adult infusion 22 mcg/min (05/24/19 0700)  . potassium chloride      PRN Medications: sodium chloride, acetaminophen, acetaminophen, fentaNYL,  influenza vaccine adjuvanted, sennosides, sodium chloride flush   Assessment/Plan:   1. Shock - Suspect combination of cardiogenic and septic shock - EF newly down 55-60% in 7/20 -> 25% now. Unclear etiology. Trop moderately up but flat. ? AF vs septic cardiomyopathy.  - Initial swan numbers with low output and co-ox 44% - Initial Bcx 2/2 Enterococcus faecalis - TEE 9/16 EF 25%  Moderate prosthetic AS/severe MS. No vegetation  - Milrinone now off. On NE + vasopressin, titrating down NE with good MAP.  Think we can try him off vasopressin today.  With low co-ox at 46% this morning, restart milrinone 0.125 and repeat co-ox this afternoon.  - ABx switched back to amp/ceftiaxone on 9/18  2. Acute systolic HF - EF 84% with severe RV dysfunction, see discussion above - CVP 15-16 today, will give Lasix 80 mg IV bid today and replace K.  - Follow CVP and co-ox  3. New onset AF with RVR  - Today he is staying in NSR.  - continue amio  - Heparin started  4. 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 Recommend 6 weeks IV abx - Doubt he would be a surgical candidate.   5. Neuro - he remains encephalopathic. Unclear how much is metabolic vs related to CVA - MRI with diffuse infarcts - ? Septic vs plaque => most likely not septic per neuro. He is on heparin gtt.  - TEE 9/16 with no clear vegetation. Very heavy aortic plaque in descending aorta on TEE  6. Acute hypoxic respiratory failure - Intubated 9/16. CCM following.   - CXR with pulmonary edema - CVP up today, restart IV  Lasix.   7. CAD s/p CABG - trop up but doubt ACS as it is flat - likely no coronary angio this admit with strokes and other issues  8. Valvular disease  - TEE suggests severe MS. Likely due to calcifications no clear vegetation or thrombosis. Not surgical or valvuloplasty candidate currently - Aortic stenosis (s/p SAVR 2006, s/p TAVR 2016) gradients suggestive of moderate bioprosthetic AS  9. Prognosis - he has multi-system issues with severe HF/cardiogenic shock with low EF and severe valvular disease as well as multiple embolic CVAs, fraility  - Prognosis extremely concerning.  - Continue supportive care for now - Consider starting [place] discussions with family and Palliative team.  CRITICAL CARE Performed by: Loralie Champagne  Total critical care time: 40 minutes  Critical care time was exclusive of separately billable procedures and treating other patients.  Critical care was necessary to treat or prevent imminent or life-threatening deterioration.  Critical care was time spent personally by me on the following activities: development of treatment plan with patient and/or surrogate as well as nursing, discussions with consultants, evaluation of patient's response to treatment, examination of patient, obtaining history from patient or surrogate, ordering and performing treatments and interventions, ordering and review of laboratory studies, ordering and review of radiographic studies, pulse oximetry and re-evaluation of patient's condition.   Loralie Champagne, MD  8:32 AM  05/24/2019

## 2019-05-24 NOTE — Progress Notes (Signed)
ANTICOAGULATION CONSULT NOTE - Follow Up Consult  Pharmacy Consult for Heparin Indication: atrial fibrillation  Allergies  Allergen Reactions  . Sulfamethoxazole Rash    [facility]    Patient Measurements: Height: 5\' 11"  (180.3 cm) Weight: 211 lb 3.2 oz (95.8 kg) IBW/kg (Calculated) : 75.3  Vital Signs: Temp: 98.8 F (37.1 C) (09/19 0723) Temp Source: Oral (09/19 0723) BP: 152/70 (09/19 0723) Pulse Rate: 56 (09/19 0715)  Heparin dosing weight: 94.4 kg  Labs: Recent Labs    05/22/19 0326  05/22/19 1842 05/23/19 0405 05/23/19 1222 05/23/19 1250 05/23/19 2217 05/24/19 0406 05/24/19 0417 05/24/19 0656  HGB 10.2*  --   --  10.0*  --  10.5*  --  10.2* 10.5*  --   HCT 30.0*  --   --  29.4*  --  31.0*  --  30.9* 31.0*  --   PLT 96*  --   --  84*  --   --   --  99*  --   --   HEPARINUNFRC  --    < >  --  0.45 0.64  --  0.14*  --   --  0.58  CREATININE 1.22  --  1.39* 1.51*  --   --   --  1.36*  --   --    < > = values in this interval not displayed.    Estimated Creatinine Clearance: 55.4 mL/min (A) (by C-G formula based on SCr of 1.36 mg/dL (H)).   Medical History: Past Medical History:  Diagnosis Date  . Aortic stenosis    Aortic valve replacement 2006  . ASD secundum    Closure, October, 2006  . Atrial fibrillation (HCC)    Postoperative, previously treated with amiodarone and Coumadin, then stop  . Bilateral carotid artery disease (HCC)   . CAD, multiple vessel    Stress echo, January , 2012, no ischemia  . Carotid artery disease (HCC)    Bilateral, followed closely by Dr. Imogene Burnhen ([facility]), February, 2012  . Cerebrovascular disease 12/04/2014  . Chest wall pain    October, 2012  . Chronic back pain    And sternal  . Diabetes mellitus type II   . Ejection fraction    EF, . 60-65%,echo, January, 2012  . 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 Hx of CABG    2006, aortic valve replacement, ASD closure  . Hyperlipidemia    Abnormal LFTs in the  past, Lipitor discontinued, followed by Dr. Dimas AguasHoward he  . Hypertension   . Mitral regurgitation    Echo, January, 2012  . Nephrolithiasis   . Numbness and tingling in hands    October, 2012  . S/P aortic valve replacement    2006, bovine pericardial tissue valve /   , Normal function, echo, January, 2012    Medications:  Medications Prior to Admission  Medication Sig Dispense Refill Last Dose  . acetaminophen (TYLENOL) 500 MG tablet Take 1,000 mg by mouth every 6 (six) hours as needed for headache (pain).   05/28/2019 at am  . amLODipine (NORVASC) 5 MG tablet Take 5 mg by mouth daily.   05/25/2019 at am  . aspirin EC 81 MG tablet Take 81 mg by mouth at bedtime.   unknown  . atorvastatin (LIPITOR) 10 MG tablet Take 10 mg by mouth at bedtime.    unknown  . gemfibrozil (LOPID) 600 MG tablet Take 600 mg by mouth 2 (two) times daily.     05/27/2019  at am  . glipiZIDE (GLUCOTROL XL) 10 MG 24 hr tablet Take 10 mg by mouth daily with breakfast.   05/17/2019 at am  . Magnesium 250 MG TABS Take 250 mg by mouth at bedtime.    unknown  . Multiple Vitamin (MULTIVITAMIN WITH MINERALS) TABS tablet Take 1 tablet by mouth daily.   05/15/2019 at am  . OVER THE COUNTER MEDICATION Take 1 tablet by mouth at bedtime. Zinc 30 mg/ copper 2 mg   unknown    Assessment: 78 y.o. male admitted on 9/11 with weakness and fall at home. On admit, he was dyspnic, hypotensive, and found to have new Afib with no anticoagulation PTA. Pt also found to have EF of 20-25% on ECHO. He developed septic shock due to enterococcus bacteremia. TEE was negative for vegetations. Heparin was initially started but held due to risk of hemorrhage from septic embolism. Due to a lack of vegetations seen, pharmacy consulted to restart heparin. 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   Heparin level now just above goal on 1450 units/hr. Hgb stable 10.5, plt low but stable at 99.  Goal of Therapy:   Heparin level 0.3-0.5 units/ml Monitor platelets by anticoagulation protocol: Yes   Plan:  Reduce heparin infusion back to 1,350 units/hr  Monitor daily Heparin Level, CBC Watch for signs/symptoms of bleeding  Erin Hearing PharmD., BCPS Clinical Pharmacist 05/24/2019 8:58 AM

## 2019-05-24 NOTE — Plan of Care (Signed)
  Problem: Education: Goal: Knowledge of General Education information will improve Description: Including pain rating scale, medication(s)/side effects and non-pharmacologic comfort measures Outcome: Not Progressing   Problem: Health Behavior/Discharge Planning: Goal: Ability to manage health-related needs will improve Outcome: Not Progressing   Problem: Clinical Measurements: Goal: Ability to maintain clinical measurements within normal limits will improve Outcome: Not Progressing Goal: Will remain free from infection Outcome: Progressing Goal: Diagnostic test results will improve Outcome: Progressing Goal: Respiratory complications will improve Outcome: Progressing Goal: Cardiovascular complication will be avoided Outcome: Progressing   Problem: Activity: Goal: Risk for activity intolerance will decrease Outcome: Not Progressing   Problem: Nutrition: Goal: Adequate nutrition will be maintained Outcome: Progressing   Problem: Coping: Goal: Level of anxiety will decrease Outcome: Not Progressing   Problem: Elimination: Goal: Will not experience complications related to bowel motility Outcome: Not Progressing Goal: Will not experience complications related to urinary retention Outcome: Not Progressing   Problem: Pain Managment: Goal: General experience of comfort will improve Outcome: Progressing   Problem: Safety: Goal: Ability to remain free from injury will improve Outcome: Not Progressing   Problem: Skin Integrity: Goal: Risk for impaired skin integrity will decrease Outcome: Not Progressing   Problem: Education: Goal: Ability to demonstrate management of disease process will improve Outcome: Not Progressing Goal: Ability to verbalize understanding of medication therapies will improve Outcome: Not Progressing Goal: Individualized Educational Video(s) Outcome: Not Progressing   Problem: Activity: Goal: Capacity to carry out activities will  improve Outcome: Not Progressing   Problem: Cardiac: Goal: Ability to achieve and maintain adequate cardiopulmonary perfusion will improve Outcome: Not Progressing

## 2019-05-24 NOTE — Progress Notes (Addendum)
NAME:  Justin Valencia, MRN:  [id number], DOB:  [birth year], LOS: 0 ADMISSION DATE:  05/28/2019, CONSULTATION DATE:  05/13/2019 REFERRING MD:  [facility], CHIEF COMPLAINT:  Hypotension   HPI  78 y/o M admitted 9/11 with several days of progressive weakness and a fall at home.  On admit, he was dyspneic and hypotensive.  Elevated troponin & ECHO with new evidence of decreased EF of 20-25%.  PA catheter placed with parameters consistent with profound cardiogenic shock.  Blood cultures with 2/2 enterococcus.    Past Medical History  HTN HLD CAD - s/p CABG 2006 with AVR (bovine) and ASD closure  Mitral Regurgitation  Bilateral carotid artery disease  AF - on amiodarone + coumadin  DM II   Cultures  COVID 9/11 >> negative  RVP 9/12 >> negative  BCID 9/12 >> enterococcus species UC 9/12 >> negative  BCx2 9/12 >> enterococcus faecalis >> pan-sensitive  BCx2 9/13 >>   Antibiotics  Cefepime 9/12 >> 9/13  Vanco 9/12 >> 9/14  Ampicillin 9/14 >>  Rocephin 9/12, 9/14 >>   Lines/Tubes  R IJ Cordis 9/12 >> 05/22/2019 Left IJ CVL 05/22/2019>> ETT 9/15 >>   Events  9/11 Admit  9/12 PA cath with profound cardiogenic shock, BC with enterococcus, AFwRVR, 6L 9/14 PCCM s/o.  PA 52/23, CVP 8, SVR 1149, PCWP 23 9/15 PCCM called back for respiratory insufficiency, intubated   Significant Diagnostic Tests  ECHO 9/12 >> LVEF 20-25%, LVEDP ~ 14mmHg, LAP ~ 40 CT Head 9/13 >> acute or subacute infarct in the R frontoparietal junction CTA Head/Neck 9/14 >> no emergent LVO, mild proximal L PCA stenosis, moderate stenosis of the proximal internal carotid arteries bilaterally, intramural hematoma over the distal aortic arch, bilateral upper lobe airspace disease with effusions  05/22/2019 TEE with a EF decreased to 25% Interim history/subjective:  No events overnight, no new complaints  Objective   BP (!) 103/92   Pulse 71   Temp 98.8 F (37.1 C) (Oral)   Resp (!) 31   Ht 5\' 11"  (1.803  m)   Wt 95.8 kg   SpO2 96%   BMI 29.46 kg/m    Intake/Output Summary (Last 24 hours) at 05/24/2019 1058 Last data filed at 05/24/2019 1000 Gross per 24 hour  Intake 5913.88 ml  Output 2025 ml  Net 3888.88 ml    Filed Weights   05/22/19 1000 05/23/19 0440 05/24/19 0500  Weight: 95 kg 91.8 kg 95.8 kg   Examination: General: Elderly male, on vent, NAD, sedate HEENT: [place]/AT, PERRL, EOM-I and MMM, ETT in place Neuro: Arousable and follows some commands CV: RRR, Nl S1/S2 and -M/R/G PULM: Coarse rhonchi bilaterally GI: Soft, NT, ND and +BS Extremities: +edema and -tenderness Skin: no rashes or lesions  I reviewed CXR myself, ETT ok and edema vs infiltrate noted  Labs   Recent Labs  Lab 05/22/19 0326 05/23/19 0405 05/23/19 1250 05/24/19 0406 05/24/19 0417  HGB 10.2* 10.0* 10.5* 10.2* 10.5*  HCT 30.0* 29.4* 31.0* 30.9* 31.0*  WBC 9.2 6.3  --  8.4  --   PLT 96* 84*  --  99*  --    Recent Labs  Lab 05/18/19 0615  05/21/19 0342 05/21/19 1500 05/22/19 0326 05/22/19 1842 05/23/19 0405 05/23/19 1250 05/24/19 0406 05/24/19 0417  NA 138   < > 144 145 147* 146* 148* 148* 143 148*  K 3.4*   < > 3.3* 3.7 3.8 3.2* 3.0* 3.9 3.5 3.5  CL 108   < >  108 111 112* 111 110  --  109  --   CO2 17*   < > 24 25 24 24 28   --  23  --   GLUCOSE 196*   < > 170* 168* 213* 218* 214*  --  274*  --   BUN 63*   < > 47* 46* 42* 38* 40*  --  43*  --   CREATININE 1.33*   < > 1.29* 1.33* 1.22 1.39* 1.51*  --  1.36*  --   CALCIUM 8.2*   < > 8.3* 8.3* 8.0* 8.2* 8.1*  --  7.8*  --   MG 2.5*  --  2.2  --  2.2  --  2.0  --  2.2  --   PHOS  --   --   --   --  1.9*  --  2.2*  --  2.7  --    < > = values in this interval not displayed.    Assessment & Plan:   Cardiogenic Shock with questionable bacteremia and sepsis Acute Systolic CHF / Concern for Infective Endocarditis LBBB Aortic Stenosis s/p Bovine AVR, MR CAD s/p CABG, Carotid Artery Disease Intramural Hematoma over Distal Aortic Arch EF  noted to be 25% -concern for possible AV insufficiency with enterococcus bacteremia and CVA P: Currently on Levophed Milrinone for heart failure per cards KVO IVF Tele monitoring  Enterococcus Bacteremia P: Continue antibiotics per ID service  Subacute R Frontal Parietal CVA -CTA head neg for LVO P: Neurology's input is appreciated  AFwRVR P:  Currently on amiodarone Per cardiology Return to atrial fibrillation during the night  Vent dependent respiratory failure in the setting of bilateral airspace disease coupled with bilateral pleural effusions.  -RVP negative  P: Vent bundle Antimicrobial therapy per ID for bacteremia with enterococcus Maintain on full vent support for now Suspect may need tracheostomy if family wishes for full vent support but will hold off decision for now  Thrombocytopenia 97->122->92->84 -suspect related to valvular disease + enterococcus bacteremia  P: Continue to monitor specimen while on anticoagulation  AKI Hypokalemia  Recent Labs  Lab 05/23/19 1250 05/24/19 0406 05/24/19 0417  K 3.9 3.5 3.5   Lab Results  Component Value Date   CREATININE 1.36 (H) 05/24/2019   CREATININE 1.51 (H) 05/23/2019   CREATININE 1.39 (H) 05/22/2019    P: Continue diureses Replace K Continue to monitor electrolytes replete as needed Monitor creatinine  DM CBG (last 3)  Recent Labs    05/24/19 0425 05/24/19 0506 05/24/19 0826  GLUCAP 288* 254* 248*    P:  Continue current sliding scale insulin protocol   At Risk Malnutrition  P:  Continue tube feedings  Remains on pressors on afternoon rounds, continue support plan as above  Best Practice  SUP: Pepcid  DVT:  Lovenox  PAD/Sedation: Fentanyl gtt  Disposition: ICU Family Communication: Updated as per cardiology  The patient is critically ill with multiple organ systems failure and requires high complexity decision making for assessment and support, frequent evaluation and titration  of therapies, application of advanced monitoring technologies and extensive interpretation of multiple databases.   Critical Care Time devoted to patient care services described in this note is  33  Minutes. This time reflects time of care of this signee Dr Koren BoundWesam Yacoub. This critical care time does not reflect procedure time, or teaching time or supervisory time of PA/NP/Med student/Med Resident etc but could involve care discussion time.  Alyson ReedyWesam G. Yacoub, M.D. [facility]. Pager: [phone]. After hours pager: [phone].

## 2019-05-25 ENCOUNTER — Inpatient Hospital Stay (HOSPITAL_COMMUNITY): Payer: Medicare HMO

## 2019-05-25 LAB — POCT I-STAT 7, (LYTES, BLD GAS, ICA,H+H)
Acid-Base Excess: 6 mmol/L — ABNORMAL HIGH (ref 0.0–2.0)
Bicarbonate: 30.8 mmol/L — ABNORMAL HIGH (ref 20.0–28.0)
Calcium, Ion: 1.14 mmol/L — ABNORMAL LOW (ref 1.15–1.40)
HCT: 29 % — ABNORMAL LOW (ref 39.0–52.0)
Hemoglobin: 9.9 g/dL — ABNORMAL LOW (ref 13.0–17.0)
O2 Saturation: 98 %
Patient temperature: 100
Potassium: 4.4 mmol/L (ref 3.5–5.1)
Sodium: 150 mmol/L — ABNORMAL HIGH (ref 135–145)
TCO2: 32 mmol/L (ref 22–32)
pCO2 arterial: 43.9 mmHg (ref 32.0–48.0)
pH, Arterial: 7.457 — ABNORMAL HIGH (ref 7.350–7.450)
pO2, Arterial: 100 mmHg (ref 83.0–108.0)

## 2019-05-25 LAB — COOXEMETRY PANEL
Carboxyhemoglobin: 0.9 % (ref 0.5–1.5)
Methemoglobin: 0.7 % (ref 0.0–1.5)
O2 Saturation: 64.6 %
Total hemoglobin: 13.6 g/dL (ref 12.0–16.0)

## 2019-05-25 LAB — BASIC METABOLIC PANEL
Anion gap: 6 (ref 5–15)
Anion gap: 13 (ref 5–15)
BUN: 44 mg/dL — ABNORMAL HIGH (ref 8–23)
BUN: 49 mg/dL — ABNORMAL HIGH (ref 8–23)
CO2: 27 mmol/L (ref 22–32)
CO2: 26 mmol/L (ref 22–32)
Calcium: 7.9 mg/dL — ABNORMAL LOW (ref 8.9–10.3)
Calcium: 7.8 mg/dL — ABNORMAL LOW (ref 8.9–10.3)
Chloride: 114 mmol/L — ABNORMAL HIGH (ref 98–111)
Chloride: 101 mmol/L (ref 98–111)
Creatinine, Ser: 1.32 mg/dL — ABNORMAL HIGH (ref 0.61–1.24)
Creatinine, Ser: 1.49 mg/dL — ABNORMAL HIGH (ref 0.61–1.24)
GFR calc Af Amer: >60 mL/min (ref >60)
GFR calc Af Amer: 52 mL/min — ABNORMAL LOW (ref >60)
GFR calc non Af Amer: 52 mL/min — ABNORMAL LOW (ref >60)
GFR calc non Af Amer: 45 mL/min — ABNORMAL LOW (ref >60)
Glucose, Bld: 225 mg/dL — ABNORMAL HIGH (ref 70–99)
Glucose, Bld: 237 mg/dL — ABNORMAL HIGH (ref 70–99)
Potassium: 4.6 mmol/L (ref 3.5–5.1)
Potassium: 3.3 mmol/L — ABNORMAL LOW (ref 3.5–5.1)
Sodium: 147 mmol/L — ABNORMAL HIGH (ref 135–145)
Sodium: 140 mmol/L (ref 135–145)

## 2019-05-25 LAB — CBC
HCT: 31.3 % — ABNORMAL LOW (ref 39.0–52.0)
Hemoglobin: 9.8 g/dL — ABNORMAL LOW (ref 13.0–17.0)
MCH: 34.1 pg — ABNORMAL HIGH (ref 26.0–34.0)
MCHC: 31.3 g/dL (ref 30.0–36.0)
MCV: 109.1 fL — ABNORMAL HIGH (ref 80.0–100.0)
Platelets: 91 10*3/uL — ABNORMAL LOW (ref 150–400)
RBC: 2.87 MIL/uL — ABNORMAL LOW (ref 4.22–5.81)
RDW: 16.8 % — ABNORMAL HIGH (ref 11.5–15.5)
WBC: 6.5 10*3/uL (ref 4.0–10.5)
nRBC: 2.3 % — ABNORMAL HIGH (ref 0.0–0.2)

## 2019-05-25 LAB — PHOSPHORUS: Phosphorus: 3.1 mg/dL (ref 2.5–4.6)

## 2019-05-25 LAB — POCT I-STAT 4, (NA,K, GLUC, HGB,HCT)
Glucose, Bld: 214 mg/dL — ABNORMAL HIGH (ref 70–99)
HCT: 30 % — ABNORMAL LOW (ref 39.0–52.0)
Hemoglobin: 10.2 g/dL — ABNORMAL LOW (ref 13.0–17.0)
Potassium: 3.4 mmol/L — ABNORMAL LOW (ref 3.5–5.1)
Sodium: 147 mmol/L — ABNORMAL HIGH (ref 135–145)

## 2019-05-25 LAB — MAGNESIUM: Magnesium: 2 mg/dL (ref 1.7–2.4)

## 2019-05-25 LAB — HEPARIN LEVEL (UNFRACTIONATED): Heparin Unfractionated: 0.41 IU/mL (ref 0.30–0.70)

## 2019-05-25 LAB — GLUCOSE, CAPILLARY
Glucose-Capillary: 185 mg/dL — ABNORMAL HIGH (ref 70–99)
Glucose-Capillary: 196 mg/dL — ABNORMAL HIGH (ref 70–99)
Glucose-Capillary: 211 mg/dL — ABNORMAL HIGH (ref 70–99)
Glucose-Capillary: 211 mg/dL — ABNORMAL HIGH (ref 70–99)
Glucose-Capillary: 216 mg/dL — ABNORMAL HIGH (ref 70–99)
Glucose-Capillary: 237 mg/dL — ABNORMAL HIGH (ref 70–99)
Glucose-Capillary: 246 mg/dL — ABNORMAL HIGH (ref 70–99)

## 2019-05-25 MED ORDER — METOLAZONE 5 MG PO TABS
10.0000 mg | ORAL_TABLET | Freq: Once | ORAL | Status: AC
  Administered 2019-05-25: 10 mg via ORAL
  Filled 2019-05-25: qty 2

## 2019-05-25 MED ORDER — POTASSIUM CHLORIDE 10 MEQ/50ML IV SOLN
10.0000 meq | INTRAVENOUS | Status: AC
  Administered 2019-05-25 – 2019-05-26 (×4): 10 meq via INTRAVENOUS
  Filled 2019-05-25 (×4): qty 50

## 2019-05-25 MED ORDER — FUROSEMIDE 10 MG/ML IJ SOLN
80.0000 mg | Freq: Three times a day (TID) | INTRAMUSCULAR | Status: DC
  Administered 2019-05-25 – 2019-05-26 (×3): 80 mg via INTRAVENOUS
  Filled 2019-05-25 (×3): qty 8

## 2019-05-25 MED ORDER — AMIODARONE LOAD VIA INFUSION
150.0000 mg | Freq: Once | INTRAVENOUS | Status: AC
  Administered 2019-05-25: 10:00:00 150 mg via INTRAVENOUS

## 2019-05-25 NOTE — Progress Notes (Signed)
ANTICOAGULATION CONSULT NOTE - Follow Up Consult  Pharmacy Consult for Heparin Indication: atrial fibrillation  Allergies  Allergen Reactions  . Sulfamethoxazole Rash    Reported by [facility] in 2016    Patient Measurements: Height: 5\' 11"  (180.3 cm) Weight: 220 lb 0.3 oz (99.8 kg) IBW/kg (Calculated) : 75.3  Vital Signs: Temp: 98.6 F (37 C) (09/20 0800) Temp Source: Oral (09/20 0800) BP: 93/74 (09/20 0800) Pulse Rate: 85 (09/20 0915)  Heparin dosing weight: 94.4 kg  Labs: Recent Labs    05/23/19 0405  05/24/19 0406 05/24/19 0417 05/24/19 0656 05/24/19 1800 05/25/19 0331 05/25/19 0405 05/25/19 0522  HGB 10.0*   < > 10.2* 10.5*  --   --  9.8*  --  9.9*  HCT 29.4*   < > 30.9* 31.0*  --   --  31.3*  --  29.0*  PLT 84*  --  99*  --   --   --  91*  --   --   HEPARINUNFRC 0.45   < >  --   --  0.58 0.47  --  0.41  --   CREATININE 1.51*  --  1.36*  --   --   --  1.32*  --   --    < > = values in this interval not displayed.    Estimated Creatinine Clearance: 58.2 mL/min (A) (by C-G formula based on SCr of 1.32 mg/dL (H)).  Assessment: 78 y.o. male admitted on 9/11 with weakness and fall at home. On admit, he was dyspnic, hypotensive, and found to have new Afib with no anticoagulation PTA. Pt also found to have EF of 20-25% on ECHO. He developed septic shock due to enterococcus bacteremia. TEE was negative for vegetations. Heparin was initially started but held due to risk of hemorrhage from septic embolism. Due to a lack of vegetations seen, pharmacy consulted to restart heparin. 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   Heparin level continues to be therapeutic this morning. No bleeding issues noted overnight. Hgb stable plt low but stable at 91.  Goal of Therapy:  Heparin level 0.3-0.5 units/ml Monitor platelets by anticoagulation protocol: Yes   Plan:  Continue heparin infusion 1350 units/hr Daily heparin  level and CBC  Erin Hearing PharmD., BCPS Clinical Pharmacist 05/25/2019 9:22 AM

## 2019-05-25 NOTE — Progress Notes (Addendum)
Patient ID: Justin Valencia, male   DOB: June 19, 1941, 78 y.o.   MRN: [id number]    Advanced Heart Failure Rounding Note   Subjective:    Remains intubated. He is awake today and can follow commands.    He is back in atrial fibrillation with rate around 100 the morning, still on amiodarone, increased to 60 mg/hr.   Temp 101 overnight, remains on amp/ceftriazone.   BP stable. He is now down to norepinephrine 3, off vasopressin.  Milrinone 0.125 with co-ox 65%.   IV Lasix started yesterday, creatinine stable at 1.3 with CVP 12-13 today.   TEE 9/16 EF 25% with severe RV dysfunction. Severe MS. No obvious vegetation. Very heavy aortic plaquing.  S/p TAVR with moderate aortic stenosis.   Objective:   Weight Range:  Vital Signs:   Temp:  [97.9 F (36.6 C)-101 F (38.3 C)] 98.6 F (37 C) (09/20 0800) Pulse Rate:  [59-118] 85 (09/20 0915) Resp:  [14-31] 25 (09/20 0915) BP: (89-122)/(50-92) 93/74 (09/20 0800) SpO2:  [94 %-100 %] 97 % (09/20 0915) Arterial Line BP: (93-160)/(44-85) 112/53 (09/20 0915) FiO2 (%):  [40 %-70 %] 40 % (09/20 0800) Weight:  [99.8 kg] 99.8 kg (09/20 0545) Last BM Date: 05/24/19  Weight change: Filed Weights   05/23/19 0440 05/24/19 0500 05/25/19 0545  Weight: 91.8 kg 95.8 kg 99.8 kg    Intake/Output:   Intake/Output Summary (Last 24 hours) at 05/25/2019 0938 Last data filed at 05/25/2019 0900 Gross per 24 hour  Intake 4467.41 ml  Output 4065 ml  Net 402.41 ml     Physical Exam: CVP 12-13 General: Intubated, awake.  Neck: No JVD, no thyromegaly or thyroid nodule.  Lungs: Decreased BS at bases.  CV: Nondisplaced PMI.  Heart regular S1/S2, no S3/S4, 2/6 SEM RUSB.  Trace ankle edema.   Abdomen: Soft, nontender, no hepatosplenomegaly, no distention.  Skin: Intact without lesions or rashes.  Neurologic: Can follow commands.  Psych: Normal affect. Extremities: No clubbing or cyanosis.  HEENT: Normal.   Telemetry: Atrial fibrillation rate 100s  Personally reviewed    Labs: Basic Metabolic Panel: Recent Labs  Lab 05/21/19 0342  05/22/19 0326 05/22/19 1842 05/23/19 0405 05/23/19 1250 05/24/19 0406 05/24/19 0417 05/25/19 0331 05/25/19 0522  NA 144   < > 147* 146* 148* 148* 143 148* 147* 150*  K 3.3*   < > 3.8 3.2* 3.0* 3.9 3.5 3.5 4.6 4.4  CL 108   < > 112* 111 110  --  109  --  114*  --   CO2 24   < > '24 24 28  ' --  23  --  27  --   GLUCOSE 170*   < > 213* 218* 214*  --  274*  --  225*  --   BUN 47*   < > 42* 38* 40*  --  43*  --  44*  --   CREATININE 1.29*   < > 1.22 1.39* 1.51*  --  1.36*  --  1.32*  --   CALCIUM 8.3*   < > 8.0* 8.2* 8.1*  --  7.8*  --  7.9*  --   MG 2.2  --  2.2  --  2.0  --  2.2  --  2.0  --   PHOS  --   --  1.9*  --  2.2*  --  2.7  --  3.1  --    < > = values in this interval not displayed.  Liver Function Tests: Recent Labs  Lab 05/19/19 0430 05/20/19 0443  AST 95* 58*  ALT 69* 57*  ALKPHOS 53 64  BILITOT 1.3* 1.0  PROT 5.9* 5.9*  ALBUMIN 2.5* 2.4*   No results for input(s): LIPASE, AMYLASE in the last 168 hours. No results for input(s): AMMONIA in the last 168 hours.  CBC: Recent Labs  Lab 05/21/19 0342 05/22/19 0326 05/23/19 0405 05/23/19 1250 05/24/19 0406 05/24/19 0417 05/25/19 0331 05/25/19 0522  WBC 10.0 9.2 6.3  --  8.4  --  6.5  --   NEUTROABS  --  7.2 4.6  --  6.0  --   --   --   HGB 10.6* 10.2* 10.0* 10.5* 10.2* 10.5* 9.8* 9.9*  HCT 29.8* 30.0* 29.4* 31.0* 30.9* 31.0* 31.3* 29.0*  MCV 101.4* 103.4* 104.3*  --  105.1*  --  109.1*  --   PLT 122* 96* 84*  --  99*  --  91*  --     Cardiac Enzymes: No results for input(s): CKTOTAL, CKMB, CKMBINDEX, TROPONINI in the last 168 hours.  BNP: BNP (last 3 results) No results for input(s): BNP in the last 8760 hours.  ProBNP (last 3 results) No results for input(s): PROBNP in the last 8760 hours.    Other results:  Imaging: Dg Chest Port 1 View  Result Date: 05/25/2019 CLINICAL DATA:  Intubated patient.  Respiratory distress. Follow-up exam. EXAM: PORTABLE CHEST 1 VIEW COMPARISON:  05/24/2019 and earlier exams. FINDINGS: Improved bilateral airspace and interstitial lung opacities. No new lung abnormalities. Endotracheal tube, left internal jugular central venous line and enteric tube are stable and well positioned. No pneumothorax. IMPRESSION: 1. Interval improvement in the lung opacities consistent with improved pulmonary edema. Probable persistent small effusions. 2. Stable well-positioned support apparatus. Electronically Signed   By: Lajean Manes M.D.   On: 05/25/2019 07:35   Dg Chest Port 1 View  Result Date: 05/24/2019 CLINICAL DATA:  Respiratory failure EXAM: PORTABLE CHEST 1 VIEW COMPARISON:  05/23/2019 FINDINGS: Endotracheal tube terminates 4.5 cm above the carina. Multifocal patchy/airspace opacities in the lungs bilaterally, favoring moderate interstitial edema over multifocal infection/pneumonia. Superimposed focal right lower lobe opacity may reflect mild infection, although this is not favored. Small bilateral pleural effusions. Cardiomegaly.  Left IJ venous catheter terminates in the lower SVC. Enteric tube courses into the stomach. Postsurgical changes related to prior CABG.  Median sternotomy. IMPRESSION: Endotracheal tube terminates 4.5 cm above the carina. Additional support apparatus as above. Multifocal patchy/airspace opacities, favoring moderate interstitial edema, mildly progressive. Superimposed right lower lobe opacity may reflect mild infection, although this is not favored. Small bilateral pleural effusions. Electronically Signed   By: Julian Hy M.D.   On: 05/24/2019 11:38     Medications:     Scheduled Medications: . amiodarone  150 mg Intravenous Once  . aspirin  81 mg Per Tube Daily  . atorvastatin  10 mg Per Tube q1800  . chlorhexidine gluconate (MEDLINE KIT)  15 mL Mouth Rinse BID  . Chlorhexidine Gluconate Cloth  6 each Topical Daily  . feeding supplement  (PRO-STAT SUGAR FREE 64)  30 mL Per Tube TID  . furosemide  80 mg Intravenous Q8H  . insulin aspart  0-20 Units Subcutaneous Q4H  . mouth rinse  15 mL Mouth Rinse 10 times per day  . pantoprazole (PROTONIX) IV  40 mg Intravenous Daily  . QUEtiapine  25 mg Per Tube BID  . sodium chloride flush  10-40 mL Intracatheter Q12H  Infusions: . sodium chloride 10 mL/hr at 05/25/19 0900  . sodium chloride Stopped (05/19/19 1914)  . amiodarone 60 mg/hr (05/25/19 0900)  . ampicillin (OMNIPEN) IV Stopped (05/25/19 0820)  . cefTRIAXone (ROCEPHIN)  IV Stopped (05/24/19 2151)  . feeding supplement (VITAL AF 1.2 CAL) 55 mL/hr at 05/22/19 2100  . fentaNYL infusion INTRAVENOUS 25 mcg/hr (05/25/19 0900)  . heparin 1,350 Units/hr (05/25/19 0900)  . milrinone 0.125 mcg/kg/min (05/25/19 0900)  . norepinephrine (LEVOPHED) Adult infusion 3 mcg/min (05/25/19 0900)    PRN Medications: sodium chloride, acetaminophen, acetaminophen, fentaNYL, influenza vaccine adjuvanted, sennosides, sodium chloride flush   Assessment/Plan:   1. Shock - Suspect combination of cardiogenic and septic shock - EF newly down 55-60% in 7/20 -> 25% now. Unclear etiology. Trop moderately up but flat. ? AF vs septic cardiomyopathy.  - Initial swan numbers with low output and co-ox 44% - Initial Bcx 2/2 Enterococcus faecalis - TEE 9/16 EF 25%  Moderate prosthetic AS/severe MS. No vegetation  - Milrinone 0.125 restarted yesterday with low co-ox, now co-ox 65%.  Off vasopressin, norepinephrine down to 3.  - ABx switched back to amp/ceftiaxone on 9/18  2. Acute systolic HF - EF 71% with severe RV dysfunction, see discussion above - CVP improving, 12-13 today. As I still > O and creatinine stable, will increase Lasix to 80 mg IV every 8 hrs today.  - Follow CVP and co-ox  3. New onset AF with RVR  - He was went back into NSR yesterday but back to afib overnight.   - Rate controlled on IV amiodarone.  - Continue heparin gtt.  -  Eventual DCCV if he does not come out of atrial fibrillation on his own.   4. 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 Recommend 6 weeks IV abx - Doubt he would be a surgical candidate.   5. Neuro - He is now following commands.  - MRI with diffuse infarcts - ? Septic vs plaque => most likely not septic per neuro. He is on heparin gtt.  - TEE 9/16 with no clear vegetation. Very heavy aortic plaque in descending aorta on TEE  6. Acute hypoxic respiratory failure - Intubated 9/16. CCM following.   - CXR with pulmonary edema - Continue diuresis as above.    7. CAD s/p CABG - trop up but doubt ACS as it is flat - likely no coronary angio this admit with strokes and other issues  8. Valvular disease  - TEE suggests severe MS. Likely due to calcifications no clear vegetation or thrombosis. Not surgical or valvuloplasty candidate currently - Aortic stenosis (s/p SAVR 2006, s/p TAVR 2016) gradients suggestive of moderate bioprosthetic AS  CRITICAL CARE Performed by: Loralie Champagne  Total critical care time: 40 minutes  Critical care time was exclusive of separately billable procedures and treating other patients.  Critical care was necessary to treat or prevent imminent or life-threatening deterioration.  Critical care was time spent personally by me on the following activities: development of treatment plan with patient and/or surrogate as well as nursing, discussions with consultants, evaluation of patient's response to treatment, examination of patient, obtaining history from patient or surrogate, ordering and performing treatments and interventions, ordering and review of  laboratory studies, ordering and review of radiographic studies, pulse  oximetry and re-evaluation of patient's condition.   Loralie Champagne, MD  9:38 AM  05/25/2019

## 2019-05-25 NOTE — Progress Notes (Signed)
RT NOTE: RT x2 changed aline dressing and pressure bag per protocol. RT will continue to monitor.

## 2019-05-25 NOTE — Progress Notes (Signed)
NAME:  Justin Valencia, MRN:  [id number], DOB:  [birth year], LOS: 0 ADMISSION DATE:  05/21/2019, CONSULTATION DATE:  05/15/2019 REFERRING MD:  [facility], CHIEF COMPLAINT:  Hypotension   HPI  78 y/o M admitted 9/11 with several days of progressive weakness and a fall at home.  On admit, he was dyspneic and hypotensive.  Elevated troponin & ECHO with new evidence of decreased EF of 20-25%.  PA catheter placed with parameters consistent with profound cardiogenic shock.  Blood cultures with 2/2 enterococcus.    Past Medical History  HTN HLD CAD - s/p CABG 2006 with AVR (bovine) and ASD closure  Mitral Regurgitation  Bilateral carotid artery disease  AF - on amiodarone + coumadin  DM II   Cultures  COVID 9/11 >> negative  RVP 9/12 >> negative  BCID 9/12 >> enterococcus species UC 9/12 >> negative  BCx2 9/12 >> enterococcus faecalis >> pan-sensitive  BCx2 9/13 >>   Antibiotics  Cefepime 9/12 >> 9/13  Vanco 9/12 >> 9/14  Ampicillin 9/14 >>  Rocephin 9/12, 9/14 >>   Lines/Tubes  R IJ Cordis 9/12 >> 05/22/2019 Left IJ CVL 05/22/2019>> ETT 9/15 >>   Events  9/11 Admit  9/12 PA cath with profound cardiogenic shock, BC with enterococcus, AFwRVR, 6L 9/14 PCCM s/o.  PA 52/23, CVP 8, SVR 1149, PCWP 23 9/15 PCCM called back for respiratory insufficiency, intubated   Significant Diagnostic Tests  ECHO 9/12 >> LVEF 20-25%, LVEDP ~ 53mmHg, LAP ~ 40 CT Head 9/13 >> acute or subacute infarct in the R frontoparietal junction CTA Head/Neck 9/14 >> no emergent LVO, mild proximal L PCA stenosis, moderate stenosis of the proximal internal carotid arteries bilaterally, intramural hematoma over the distal aortic arch, bilateral upper lobe airspace disease with effusions  05/22/2019 TEE with a EF decreased to 25%  Interim history/subjective:  No events overnight, no new complaints More awake this AM  Objective   BP 93/74 (BP Location: Left Arm)   Pulse 85   Temp 98.6 F (37 C)  (Oral)   Resp (!) 25   Ht 5\' 11"  (1.803 m)   Wt 99.8 kg   SpO2 97%   BMI 30.69 kg/m    Intake/Output Summary (Last 24 hours) at 05/25/2019 0959 Last data filed at 05/25/2019 0900 Gross per 24 hour  Intake 4467.41 ml  Output 4065 ml  Net 402.41 ml    Filed Weights   05/23/19 0440 05/24/19 0500 05/25/19 0545  Weight: 91.8 kg 95.8 kg 99.8 kg   Examination: General: Elderly male, NAD, more awake and interactive this AM HEENT: [place]/AT, PERRL, EOM-I and MMM Neuro: Arousable and more interactive this AM CV: RRR, Nl S1/S2 and -M/R/G PULM: Coarse BS diffusely GI: Soft, NT, ND and +BS Extremities: +edema and -tenderness Skin: no rashes or lesions  I reviewed CXR myself, ETT ok and infiltrate/edema noted  Discussed with Cards-MD  Labs   Recent Labs  Lab 05/23/19 0405  05/24/19 0406 05/24/19 0417 05/25/19 0331 05/25/19 0522  HGB 10.0*   < > 10.2* 10.5* 9.8* 9.9*  HCT 29.4*   < > 30.9* 31.0* 31.3* 29.0*  WBC 6.3  --  8.4  --  6.5  --   PLT 84*  --  99*  --  91*  --    < > = values in this interval not displayed.   Recent Labs  Lab 05/21/19 0342  05/22/19 0326 05/22/19 1842 05/23/19 0405 05/23/19 1250 05/24/19 0406 05/24/19 4008  05/25/19 0331 05/25/19 0522  NA 144   < > 147* 146* 148* 148* 143 148* 147* 150*  K 3.3*   < > 3.8 3.2* 3.0* 3.9 3.5 3.5 4.6 4.4  CL 108   < > 112* 111 110  --  109  --  114*  --   CO2 24   < > 24 24 28   --  23  --  27  --   GLUCOSE 170*   < > 213* 218* 214*  --  274*  --  225*  --   BUN 47*   < > 42* 38* 40*  --  43*  --  44*  --   CREATININE 1.29*   < > 1.22 1.39* 1.51*  --  1.36*  --  1.32*  --   CALCIUM 8.3*   < > 8.0* 8.2* 8.1*  --  7.8*  --  7.9*  --   MG 2.2  --  2.2  --  2.0  --  2.2  --  2.0  --   PHOS  --   --  1.9*  --  2.2*  --  2.7  --  3.1  --    < > = values in this interval not displayed.    Assessment & Plan:   Cardiogenic Shock with questionable bacteremia and sepsis Acute Systolic CHF / Concern for Infective  Endocarditis LBBB Aortic Stenosis s/p Bovine AVR, MR CAD s/p CABG, Carotid Artery Disease Intramural Hematoma over Distal Aortic Arch EF noted to be 25% -concern for possible AV insufficiency with enterococcus bacteremia and CVA P: Currently on Levophed and milrinone Milrinone for heart failure per cards KVO IVF Tele monitoring Add Zaroxolyn x1 dose as patient remains fluid positive  Enterococcus Bacteremia P: Continue antibiotics per ID service  Subacute R Frontal Parietal CVA -CTA head neg for LVO P: Neurology's input is appreciated  AFwRVR P:  Currently on amiodarone Per cardiology Return to atrial fibrillation during the night  Vent dependent respiratory failure in the setting of bilateral airspace disease coupled with bilateral pleural effusions.  -RVP negative  P: Vent bundle Antimicrobial therapy per ID for bacteremia with enterococcus Decrease PEEP to 5 Maintain on full vent support otherwise Suspect may need tracheostomy if family wishes for full vent support but will hold off decision for now, will likely discuss that on Monday  Thrombocytopenia 97->122->92->84 -suspect related to valvular disease + enterococcus bacteremia  P: Continue to monitor specimen while on anticoagulation  AKI Hypokalemia  Recent Labs  Lab 05/24/19 0417 05/25/19 0331 05/25/19 0522  K 3.5 4.6 4.4   Lab Results  Component Value Date   CREATININE 1.32 (H) 05/25/2019   CREATININE 1.36 (H) 05/24/2019   CREATININE 1.51 (H) 05/23/2019   P: Continue diureses Replace K Continue to monitor electrolytes replete as needed Monitor creatinine Lasix 80 q8 and zaroxolyn 10 mg PO x1  DM CBG (last 3)  Recent Labs    05/24/19 2055 05/24/19 2313 05/25/19 0326  GLUCAP 201* 185* 211*   P: Continue current sliding scale insulin protocol  At Risk Malnutrition  P: Continue tube feedings  Best Practice  SUP: Pepcid  DVT:  Lovenox  PAD/Sedation: Fentanyl gtt  Disposition:  ICU Family Communication: Updated as per cardiology  The patient is critically ill with multiple organ systems failure and requires high complexity decision making for assessment and support, frequent evaluation and titration of therapies, application of advanced monitoring technologies and extensive interpretation of multiple databases.  Critical Care Time devoted to patient care services described in this note is  32  Minutes. This time reflects time of care of this signee Dr Koren BoundWesam Dalayza Zambrana. This critical care time does not reflect procedure time, or teaching time or supervisory time of PA/NP/Med student/Med Resident etc but could involve care discussion time.  Alyson ReedyWesam G. Denny Lave, M.D. [facility]. Pager: [phone]. After hours pager: [phone].

## 2019-05-25 NOTE — Progress Notes (Signed)
[facility] Progress Note Patient Name: Justin Valencia DOB: 1940-10-29 MRN: [id number]   Date of Service  05/25/2019  HPI/Events of Note  Frequent ectopy - K+ = 3.3 and Creatinine = 1.49.   eICU Interventions  Will order: 1. Replace K+.  2. Mg++ level STAT.     Intervention Category Major Interventions: Electrolyte abnormality - evaluation and management  Wendee Hata [place] 05/25/2019, 11:15 PM

## 2019-05-26 ENCOUNTER — Inpatient Hospital Stay (HOSPITAL_COMMUNITY): Payer: Medicare HMO

## 2019-05-26 LAB — POCT I-STAT 7, (LYTES, BLD GAS, ICA,H+H)
Acid-Base Excess: 8 mmol/L — ABNORMAL HIGH (ref 0.0–2.0)
Acid-Base Excess: 9 mmol/L — ABNORMAL HIGH (ref 0.0–2.0)
Acid-Base Excess: 2 mmol/L (ref 0.0–2.0)
Bicarbonate: 31.8 mmol/L — ABNORMAL HIGH (ref 20.0–28.0)
Bicarbonate: 33.6 mmol/L — ABNORMAL HIGH (ref 20.0–28.0)
Bicarbonate: 28.9 mmol/L — ABNORMAL HIGH (ref 20.0–28.0)
Calcium, Ion: 1.13 mmol/L — ABNORMAL LOW (ref 1.15–1.40)
Calcium, Ion: 1.13 mmol/L — ABNORMAL LOW (ref 1.15–1.40)
Calcium, Ion: 1.05 mmol/L — ABNORMAL LOW (ref 1.15–1.40)
HCT: 31 % — ABNORMAL LOW (ref 39.0–52.0)
HCT: 32 % — ABNORMAL LOW (ref 39.0–52.0)
HCT: 31 % — ABNORMAL LOW (ref 39.0–52.0)
Hemoglobin: 10.5 g/dL — ABNORMAL LOW (ref 13.0–17.0)
Hemoglobin: 10.9 g/dL — ABNORMAL LOW (ref 13.0–17.0)
Hemoglobin: 10.5 g/dL — ABNORMAL LOW (ref 13.0–17.0)
O2 Saturation: 92 %
O2 Saturation: 94 %
O2 Saturation: 81 %
Patient temperature: 98.1
Patient temperature: 99
Patient temperature: 98.9
Potassium: 4.1 mmol/L (ref 3.5–5.1)
Potassium: 3.6 mmol/L (ref 3.5–5.1)
Potassium: 3.8 mmol/L (ref 3.5–5.1)
Sodium: 146 mmol/L — ABNORMAL HIGH (ref 135–145)
Sodium: 147 mmol/L — ABNORMAL HIGH (ref 135–145)
Sodium: 148 mmol/L — ABNORMAL HIGH (ref 135–145)
TCO2: 33 mmol/L — ABNORMAL HIGH (ref 22–32)
TCO2: 35 mmol/L — ABNORMAL HIGH (ref 22–32)
TCO2: 31 mmol/L (ref 22–32)
pCO2 arterial: 39.6 mmHg (ref 32.0–48.0)
pCO2 arterial: 45.7 mmHg (ref 32.0–48.0)
pCO2 arterial: 57.4 mmHg — ABNORMAL HIGH (ref 32.0–48.0)
pH, Arterial: 7.512 — ABNORMAL HIGH (ref 7.350–7.450)
pH, Arterial: 7.475 — ABNORMAL HIGH (ref 7.350–7.450)
pH, Arterial: 7.31 — ABNORMAL LOW (ref 7.350–7.450)
pO2, Arterial: 56 mmHg — ABNORMAL LOW (ref 83.0–108.0)
pO2, Arterial: 68 mmHg — ABNORMAL LOW (ref 83.0–108.0)
pO2, Arterial: 51 mmHg — ABNORMAL LOW (ref 83.0–108.0)

## 2019-05-26 LAB — BASIC METABOLIC PANEL
Anion gap: 13 (ref 5–15)
BUN: 52 mg/dL — ABNORMAL HIGH (ref 8–23)
CO2: 28 mmol/L (ref 22–32)
Calcium: 8.1 mg/dL — ABNORMAL LOW (ref 8.9–10.3)
Chloride: 105 mmol/L (ref 98–111)
Creatinine, Ser: 1.58 mg/dL — ABNORMAL HIGH (ref 0.61–1.24)
GFR calc Af Amer: 49 mL/min — ABNORMAL LOW (ref >60)
GFR calc non Af Amer: 42 mL/min — ABNORMAL LOW (ref >60)
Glucose, Bld: 232 mg/dL — ABNORMAL HIGH (ref 70–99)
Potassium: 4.2 mmol/L (ref 3.5–5.1)
Sodium: 146 mmol/L — ABNORMAL HIGH (ref 135–145)

## 2019-05-26 LAB — GLUCOSE, CAPILLARY
Glucose-Capillary: 228 mg/dL — ABNORMAL HIGH (ref 70–99)
Glucose-Capillary: 188 mg/dL — ABNORMAL HIGH (ref 70–99)
Glucose-Capillary: 218 mg/dL — ABNORMAL HIGH (ref 70–99)

## 2019-05-26 LAB — CBC
HCT: 32.3 % — ABNORMAL LOW (ref 39.0–52.0)
Hemoglobin: 10.5 g/dL — ABNORMAL LOW (ref 13.0–17.0)
MCH: 34.4 pg — ABNORMAL HIGH (ref 26.0–34.0)
MCHC: 32.5 g/dL (ref 30.0–36.0)
MCV: 105.9 fL — ABNORMAL HIGH (ref 80.0–100.0)
Platelets: 153 10*3/uL (ref 150–400)
RBC: 3.05 MIL/uL — ABNORMAL LOW (ref 4.22–5.81)
RDW: 17 % — ABNORMAL HIGH (ref 11.5–15.5)
WBC: 10.5 10*3/uL (ref 4.0–10.5)
nRBC: 2.4 % — ABNORMAL HIGH (ref 0.0–0.2)

## 2019-05-26 LAB — COOXEMETRY PANEL
Carboxyhemoglobin: 1.4 % (ref 0.5–1.5)
Methemoglobin: 0.7 % (ref 0.0–1.5)
O2 Saturation: 66.6 %
Total hemoglobin: 9.2 g/dL — ABNORMAL LOW (ref 12.0–16.0)

## 2019-05-26 LAB — PHOSPHORUS: Phosphorus: 2.8 mg/dL (ref 2.5–4.6)

## 2019-05-26 LAB — MAGNESIUM
Magnesium: 1.9 mg/dL (ref 1.7–2.4)
Magnesium: 2.1 mg/dL (ref 1.7–2.4)

## 2019-05-26 LAB — HEPARIN LEVEL (UNFRACTIONATED): Heparin Unfractionated: 0.36 IU/mL (ref 0.30–0.70)

## 2019-05-26 MED ORDER — EPINEPHRINE HCL 5 MG/250ML IV SOLN IN NS: 0.5000 ug/min | INTRAVENOUS | Status: DC

## 2019-05-26 MED ORDER — FREE WATER: 250.0000 mL | Freq: Four times a day (QID) | Status: DC

## 2019-05-26 MED ORDER — POTASSIUM CHLORIDE 20 MEQ PO PACK
40.0000 meq | PACK | Freq: Once | ORAL | Status: AC
  Administered 2019-05-26: 40 meq
  Filled 2019-05-26: qty 2

## 2019-05-26 MED ORDER — SODIUM CHLORIDE 0.9 % IV SOLN
2.0000 g | Freq: Four times a day (QID) | INTRAVENOUS | Status: DC
  Administered 2019-05-26: 2 g via INTRAVENOUS
  Filled 2019-05-26: qty 2
  Filled 2019-05-26 (×3): qty 2000

## 2019-05-26 MED ORDER — POTASSIUM CHLORIDE 20 MEQ PO PACK
20.0000 meq | PACK | Freq: Every day | ORAL | Status: DC
  Administered 2019-05-26: 20 meq

## 2019-05-26 MED ORDER — EPINEPHRINE HCL 5 MG/250ML IV SOLN IN NS
0.5000 ug/min | INTRAVENOUS | Status: DC
  Filled 2019-05-26: qty 250

## 2019-05-26 MED ORDER — INSULIN ASPART 100 UNIT/ML ~~LOC~~ SOLN: 6.0000 [IU] | SUBCUTANEOUS | Status: DC

## 2019-05-26 MED ORDER — METOLAZONE 5 MG PO TABS: 10.0000 mg | ORAL_TABLET | Freq: Once | ORAL | Status: DC

## 2019-05-26 MED ORDER — EPINEPHRINE 1 MG/10ML IJ SOSY
PREFILLED_SYRINGE | INTRAMUSCULAR | Status: AC
  Filled 2019-05-26: qty 10

## 2019-05-26 MED ORDER — PHENYLEPHRINE HCL-NACL 40-0.9 MG/250ML-% IV SOLN
0.0000 ug/min | INTRAVENOUS | Status: DC
  Administered 2019-05-26: 300 ug/min via INTRAVENOUS
  Administered 2019-05-26: 30 ug/min via INTRAVENOUS
  Filled 2019-05-26 (×3): qty 250

## 2019-05-26 MED ORDER — INSULIN ASPART 100 UNIT/ML ~~LOC~~ SOLN
3.0000 [IU] | SUBCUTANEOUS | Status: DC
  Administered 2019-05-26: 3 [IU] via SUBCUTANEOUS

## 2019-05-26 MED FILL — Medication: Qty: 1 | Status: AC

## 2019-05-28 LAB — CULTURE, BLOOD (ROUTINE X 2)
Culture: NO GROWTH
Culture: NO GROWTH
Special Requests: ADEQUATE
Special Requests: ADEQUATE

## 2019-06-05 NOTE — Progress Notes (Addendum)
NAME:  Justin Valencia, MRN:  [id number], DOB:  [birth year], LOS: 0 ADMISSION DATE:  05/18/2019, CONSULTATION DATE:  05/18/2019 REFERRING MD:  [facility], CHIEF COMPLAINT:  Hypotension   HPI  78 y/o M admitted 9/11 with several days of progressive weakness and a fall at home.  On admit, he was dyspneic and hypotensive.  Elevated troponin & ECHO with new evidence of decreased EF of 20-25%.  PA catheter placed with parameters consistent with profound cardiogenic shock.  Blood cultures with 2/2 enterococcus.    Past Medical History  HTN HLD CAD - s/p CABG 2006 with AVR (bovine) and ASD closure  Mitral Regurgitation  Bilateral carotid artery disease  AF - on amiodarone + coumadin  DM II   Cultures  COVID 9/11 >> negative  RVP 9/12 >> negative  BCID 9/12 >> enterococcus species UC 9/12 >> negative  BCx2 9/12 >> enterococcus faecalis >> pan-sensitive  BCx2 9/13 >>NG x 5 days BCxx2 9/18>> NG x3 days  Antibiotics  Cefepime 9/12 >> 9/13  Vanco 9/12 >> 9/14  Ampicillin 9/14 >>  Rocephin 9/12, 9/14 >>   Lines/Tubes  R IJ Cordis 9/12 >> 05/22/2019 Left IJ CVL 05/22/2019>> ETT 9/15 >>   Events  9/11 Admit  9/12 PA cath with profound cardiogenic shock, BC with enterococcus, AFwRVR, 6L 9/14 PCCM s/o.  PA 52/23, CVP 8, SVR 1149, PCWP 23 9/15 PCCM called back for respiratory insufficiency, intubated   Significant Diagnostic Tests  ECHO 9/12 >> LVEF 20-25%, LVEDP ~ , LAP ~ 40 CT Head 9/13 >> acute or subacute infarct in the R frontoparietal junction CTA Head/Neck 9/14 >> no emergent LVO, mild proximal L PCA stenosis, moderate stenosis of the proximal internal carotid arteries bilaterally, intramural hematoma over the distal aortic arch, bilateral upper lobe airspace disease with effusions  05/22/2019 TEE with a EF decreased to 25% 9/21: CXR b/l increased pulm edema and effusions, my read   Interim history/subjective:  Was on low dose neo overnight, now off. Remains on  milrinone. Alert, interactive.   Objective   BP (!) 135/46   Pulse 67   Temp 99 F (37.2 C) (Oral)   Resp (!) 25   Ht 5\' 11"  (1.803 m)   Wt 97.5 kg   SpO2 92%   BMI 29.98 kg/m    Intake/Output Summary (Last 24 hours) at 05/12/2019 0836 Last data filed at 06/02/2019 0800 Gross per 24 hour  Intake 4336.43 ml  Output 6005 ml  Net -1668.57 ml    Filed Weights   05/24/19 0500 05/25/19 0545 05/20/2019 0500  Weight: 95.8 kg 99.8 kg 97.5 kg   Examination: General: Elderly, well-developed, well nourished. NAD HENT: Normocephalic, PERRL. Moist mucus membranes Neck: No JVD. Trachea midline. No thyromegaly, no lymphadenopathy CV: RRR. S1S2. 3/6 M, no RG. +2 distal pulses Lungs: BBS present, crackles at bases, FNL, symmetrical. Vent supported.  ABD: +BS x4. SNT/ND. No masses, guarding or rigidity GU: Foley EXT: MAE well. LU/LLE edema >RU/RLE Skin: PWD. In tact. No rashes or lesions Neuro: Alert, looking around, follows simple commands of bilateral upper and lower extremities.  Weak on left compared to right.  Denies pain by shaking head negative.    Labs   Recent Labs  Lab 05/24/19 0406  05/25/19 0331  05/10/2019 0447 05/13/2019 0500 06/02/2019 0805  HGB 10.2*   < > 9.8*   < > 10.5* 10.5* 10.9*  HCT 30.9*   < > 31.3*   < > 31.0*  32.3* 32.0*  WBC 8.4  --  6.5  --   --  10.5  --   PLT 99*  --  91*  --   --  153  --    < > = values in this interval not displayed.   Recent Labs  Lab 05/22/19 0326  05/23/19 0405  05/24/19 0406  05/25/19 0331  05/25/19 2135 05/25/19 2233 05/25/19 2327 06/02/2019 0447 05/14/2019 0500 05/08/2019 0805  NA 147*   < > 148*   < > 143   < > 147*   < > 147* 140  --  146* 146* 147*  K 3.8   < > 3.0*   < > 3.5   < > 4.6   < > 3.4* 3.3*  --  4.1 4.2 3.6  CL 112*   < > 110  --  109  --  114*  --   --  101  --   --  105  --   CO2 24   < > 28  --  23  --  27  --   --  26  --   --  28  --   GLUCOSE 213*   < > 214*  --  274*  --  225*  --  214* 237*  --   --   232*  --   BUN 42*   < > 40*  --  43*  --  44*  --   --  49*  --   --  52*  --   CREATININE 1.22   < > 1.51*  --  1.36*  --  1.32*  --   --  1.49*  --   --  1.58*  --   CALCIUM 8.0*   < > 8.1*  --  7.8*  --  7.9*  --   --  7.8*  --   --  8.1*  --   MG 2.2  --  2.0  --  2.2  --  2.0  --   --   --  1.9  --  2.1  --   PHOS 1.9*  --  2.2*  --  2.7  --  3.1  --   --   --   --   --  2.8  --    < > = values in this interval not displayed.    Assessment & Plan:   Cardiogenic Shock with bacteremia and sepsis Acute Systolic CHF / Concern for Infective Endocarditis LBBB Aortic Stenosis s/p Bovine AVR, MR CAD s/p CABG, Carotid Artery Disease Intramural Hematoma over Distal Aortic Arch EF noted to be 25% -concern for possible AV insufficiency with enterococcus bacteremia and CVA -He has excellent urine output however chest x-ray worse today with pulmonary edema and bilateral effusions P: Was on low-dose Neo-Synephrine now weaned off, remains on  milrinone Milrinone for heart failure per cards [facility] IVF Tele monitoring Continue to diurese per cardiology.  Patient does have excellent urine output.  Enterococcus Bacteremia P: Continue antibiotics per ID service  Subacute R Frontal Parietal CVA -CTA head neg for LVO P: -Neurology's input is appreciated -Continue aspirin, statin  AFwRVR.  In sinus rhythm this morning. P: -Remains on amiodarone and heparin infusion -Per cardiology   Vent dependent respiratory failure in the setting of bilateral airspace disease coupled with bilateral pleural effusions. -RVP negative  P: -Continue ventilator support to prevent eminent deterioration and further organ dysfunction from hypoxemia and hypercarbia.   -  Maintain SpO2 greater than or equal to 90%. -Head of bed elevated 30 degrees. -Plateau pressures less than 30 cm H20.  -Follow chest x-ray, ABG.   -SAT/SBT as tolerated.  He is more awake and can probably participate in SBT however with continued  volume overload and deconditioning will be a difficult wean.  We will need to address goals of care including possible tracheostomy with family -Bronchial hygiene. -RT/bronchodilator protocol. -Diurese per cardiology   Thrombocytopenia-this appears to be resolving as platelets are up to 153 today. -suspect related to valvular disease, medications and enterococcus bacteremia  P: -Continue to monitor while on anticoagulation  AKI Hypokalemia  Recent Labs  Lab 2019-01-08 0447 2019-01-08 0500 2019-01-08 0805  K 4.1 4.2 3.6   Lab Results  Component Value Date   CREATININE 1.58 (H) 2020-01-619   CREATININE 1.49 (H) 05/25/2019   CREATININE 1.32 (H) 05/25/2019   P: -Continue diureses cardiology -Continue to replace K -Continue to monitor electrolytes replace as needed -Monitor creatinine  DM CBG (last 3)  Recent Labs    05/25/19 2024 05/25/19 2333 2019-01-08 0445  GLUCAP 216* 237* 228*   P: Continue current sliding scale insulin protocol -Add scheduled coverage with tube feeds  At Risk Malnutrition  P: Continue tube feedings  Best Practice  SUP: PPI DVT: heparin gtt  PAD/Sedation: Fentanyl gtt  Disposition: ICU Family Communication: Updated as per cardiology    The patient is critically ill with respiratory failure and multifactorial shock. He requires ICU for high complexity decision making, titration of high alert medications, ventilator management, titration of oxygen and interpretation of advanced monitoring.    I personally spent 35 minutes providing critical care services including personally reviewing test results, discussing care with nursing staff/other physicians and completing orders pertaining to this patient.  Time was exclusive to the patient and does not include time spent teaching or in procedures.  Voice recognition software was used in the production of this record.  Errors in interpretation may have been inadvertently missed during review.  Karin Lieuhonda  Wright, MSN, AGACNP  Pager [phone] or if no answer [phone] [facility]  Attending Note:  78 year old male with CHF, cardiogenic shock and respiratory failure.  Adequate UOP overnight. On exam, not weaning but appears more comfortable on the vent today compared to yesterday with coarse BS diffusely.  I reviewed CXR myself, ETT is in a good position.  Decrease RR to 12.  Decrease PEEP to 5.  Continue diureses.  Add zaroxolyn for diureses to lasix.  Add PO K.  BMET in AM.  Hold weaning today.  If diureses well overnight will consider weaning in AM.  PCCM will continue to follow.  The patient is critically ill with multiple organ systems failure and requires high complexity decision making for assessment and support, frequent evaluation and titration of therapies, application of advanced monitoring technologies and extensive interpretation of multiple databases.   Critical Care Time devoted to patient care services described in this note is  32  Minutes. This time reflects time of care of this signee Dr Koren BoundWesam . This critical care time does not reflect procedure time, or teaching time or supervisory time of PA/NP/Med student/Med Resident etc but could involve care discussion time.  Alyson ReedyWesam G. , M.D. [facility]. Pager: [phone]. After hours pager: [phone].

## 2019-06-05 NOTE — Death Summary Note (Addendum)
  Advanced Heart Failure Death Summary  Death Summary   Patient ID: Justin Valencia MRN: [id number], DOB/AGE: July 10, 1941 78 y.o. Admit date: 05/08/2019 D/C date:     06/09/2019   Primary Discharge Diagnoses:  1. Shock - Suspect combination of cardiogenic and septic shock 2. Acute systolic HF 3. New onset paroxysmal AF with RVR  4. Enterococcus bacteremia - BLd CX 9/12 - Enterococcus faecalis 5. Neuro - acute metabolic encephalopathy 6. Acute hypoxic respiratory failure - Intubated 9/16.  7. CAD s/p CABG 8. Valvular disease   Consultants  ID Stroke Team  [facility] Course:  Mr. 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  Presented to [facility] ED via EMS with chills and AMS. Admitted in shock,  new onset A fib, and acute systolic heart failure. He was referred to HF team for shock management. Placed on dobutamine with ongoing shock so norepi was added.   Luiz Blare was placed to guide therapy. Blood cultures 2/2 + for enterococcus. ID consulted. Placed on antibiotics and expanded as indicated. On 9/14 he had AMS so CT of head was ordered and showed acute/subacute infarction in r frontal parietal junction in the setting of a fib and possible endocarditis.  Neurology consulted and recommended holding anticoagulants and obtaining TEE to assess for vegetation. Despite being in A fib he was taken off heparin drip due to concern for bleed. On 05/20/19 he developed respiratory failure requiring urgent intubated.  CCM followed closely. He later had TEE that was negative for vegetation.  Heparin was restarted once stabilized. He remained intubated and on pressors. Blood cultures showed enterococcus faecalis with ID narrowing antimicrobial coverage.   Hospsital course was  complicated by cardiogenic shock, septic shock, and respiratory failure. Unfortunately on 9/21 he developed asystole with CPR initiated. He was treated with multiple rounds of epi and bicarb with intermittent ROSC and started on epi drip. He then developed asystole and had evidence of flail chest . Dr Haroldine Laws discussed with his wife and son and if was decided to stop resuscitative efforts.  He passed away with his wife at that bedside 2019-06-09 at 1224 .   Duration of Discharge Encounter: Greater than 35 minutes   Signed, Darrick Grinder, NP 2019-06-09, 3:21 PM  Agree with above.   Glori Bickers, MD  6:52 PM

## 2019-06-05 NOTE — Progress Notes (Signed)
[place] Progress Note Patient Name: Justin Valencia DOB: Apr 03, 1941 MRN: [id number]   Date of Service  05/07/2019  HPI/Events of Note  ABG on 40%/PRVC 16/TV 550/P 5 = 7.512/39.6/56.0/31.0.  eICU Interventions  Will order: 1. Decrease PRVC rate to 12. 2. Repeat ABG at 8 AM.     Intervention Category Major Interventions: Acid-Base disturbance - evaluation and management;Respiratory failure - evaluation and management  Lysle Dingwall 05/08/2019, 6:31 AM

## 2019-06-05 NOTE — Code Documentation (Signed)
   Patient developed VT arrest this am.  Code team responded immediately. I arrived a few minutes later.   On my arrival patient asystolic with full CPR ongoing. He was treated with multiple rounds of EPI and bicarb with intermittent ROSC. He was started on epi gtt.   We were able to stabilize him for a few minutes with junctional rhythm and frequent PVCs. He then developed asystole again. He had evidence of flail chest on exam.   I discussed the situation with his wife and son and it was decided that patient would not want further resuscitative methods.   The Code was called and he was pronounced at 12:24pm.   Additional 18min CCT   Glori Bickers, MD  3:15 PM

## 2019-06-05 NOTE — Progress Notes (Signed)
PHARMACY NOTE:  ANTIMICROBIAL RENAL DOSAGE ADJUSTMENT  Current antimicrobial regimen includes a mismatch between antimicrobial dosage and estimated renal function.  As per policy approved by the Pharmacy & Therapeutics and Medical Executive Committees, the antimicrobial dosage will be adjusted accordingly.  Current antimicrobial dosage:  Ampicillin 2 g IV q4h  Indication: enterococcus bacteremia  Renal Function:  Estimated Creatinine Clearance: 48.1 mL/min (A) (by C-G formula based on SCr of 1.58 mg/dL (H)). []      On intermittent HD, scheduled: []      On CRRT    Antimicrobial dosage has been changed to:  Ampicillin 2 g IV q6h  Additional comments:   Thank you for allowing pharmacy to be a part of this patient's care.  Vertis Kelch, PharmD PGY2 Cardiology Pharmacy Resident Phone [phone] May 28, 2019       10:01 AM  Please check [email or web address] for unit-specific pharmacist phone numbers

## 2019-06-05 NOTE — Progress Notes (Signed)
[place] Progress Note Patient Name: Justin Valencia DOB: Dec 19, 1940 MRN: [id number]   Date of Service  05-28-2019  HPI/Events of Note  Increasing HR with increasing dose of Norepinephrine.   eICU Interventions  Will order: 1. Phenylephrine IV infusion. Titrate to MAP >=65. 2. Wean Norepinephrine IV infusion off as tolerated.      Intervention Category Major Interventions: Arrhythmia - evaluation and management  Sommer,Steven Eugene 05/28/2019, 2:10 AM

## 2019-06-05 NOTE — Progress Notes (Signed)
Patient ID: Justin Valencia, male   DOB: 08-30-1941, 78 y.o.   MRN: [id number]    Advanced Heart Failure Rounding Note   Subjective:    Remains intubated. He is awake today and can follow commands.    In/out of AF overnight. Back in NSR this am. Asking for tube to be pulled.   Tmax 100.6 yesterday remains on amp/ceftriazone.   Off NE. On milrinone 0.125. Started on neo overnight - now weaning. SBP 130  Remains on IV lasix 80 IV tid. Weight down 6 pounds. CVP 7  TEE 9/16 EF 25% with severe RV dysfunction. Severe MS. No obvious vegetation. Very heavy aortic plaquing.  S/p TAVR with moderate aortic stenosis.   Objective:   Weight Range:  Vital Signs:   Temp:  [98.1 F (36.7 C)-100.6 F (38.1 C)] 99 F (37.2 C) (09/21 0800) Pulse Rate:  [32-138] 67 (09/21 0805) Resp:  [12-36] 25 (09/21 0805) BP: (72-135)/(46-82) 135/46 (09/21 0805) SpO2:  [85 %-100 %] 92 % (09/21 0805) Arterial Line BP: (86-158)/(38-81) 123/55 (09/21 0800) FiO2 (%):  [40 %] 40 % (09/21 0837) Weight:  [97.5 kg] 97.5 kg (09/21 0500) Last BM Date: 05/25/19  Weight change: Filed Weights   05/24/19 0500 05/25/19 0545 05/27/2019 0500  Weight: 95.8 kg 99.8 kg 97.5 kg    Intake/Output:   Intake/Output Summary (Last 24 hours) at 05-27-19 0842 Last data filed at 2019-05-27 0800 Gross per 24 hour  Intake 4336.43 ml  Output 6005 ml  Net -1668.57 ml     Physical Exam: CVP 7 General:  Awake on vent following all commands HEENT: normal Neck: supple. JVP 7. Carotids 2+ bilat; no bruits. No lymphadenopathy or thryomegaly appreciated. Cor: PMI nondisplaced. Regular rate & rhythm. No rubs, gallops or murmurs. Lungs: clear Abdomen: soft, nontender, nondistended. No hepatosplenomegaly. No bruits or masses. Good bowel sounds. Extremities: no cyanosis, clubbing, rash, traceedema Neuro: alert & orientedx3, cranial nerves grossly intact. moves all 4 extremities w/o difficulty. Affect pleasant   Telemetry: NSR 60-70s  AF with RVR overnight Personally reviewed    Labs: Basic Metabolic Panel: Recent Labs  Lab 05/22/19 0326  05/23/19 0405  05/24/19 0406  05/25/19 0331  05/25/19 2135 05/25/19 2233 05/25/19 2327 05/27/19 0447 05-27-19 0500 2019-05-27 0805  NA 147*   < > 148*   < > 143   < > 147*   < > 147* 140  --  146* 146* 147*  K 3.8   < > 3.0*   < > 3.5   < > 4.6   < > 3.4* 3.3*  --  4.1 4.2 3.6  CL 112*   < > 110  --  109  --  114*  --   --  101  --   --  105  --   CO2 24   < > 28  --  23  --  27  --   --  26  --   --  28  --   GLUCOSE 213*   < > 214*  --  274*  --  225*  --  214* 237*  --   --  232*  --   BUN 42*   < > 40*  --  43*  --  44*  --   --  49*  --   --  52*  --   CREATININE 1.22   < > 1.51*  --  1.36*  --  1.32*  --   --  1.49*  --   --  1.58*  --   CALCIUM 8.0*   < > 8.1*  --  7.8*  --  7.9*  --   --  7.8*  --   --  8.1*  --   MG 2.2  --  2.0  --  2.2  --  2.0  --   --   --  1.9  --  2.1  --   PHOS 1.9*  --  2.2*  --  2.7  --  3.1  --   --   --   --   --  2.8  --    < > = values in this interval not displayed.    Liver Function Tests: Recent Labs  Lab 05/20/19 0443  AST 58*  ALT 57*  ALKPHOS 64  BILITOT 1.0  PROT 5.9*  ALBUMIN 2.4*   No results for input(s): LIPASE, AMYLASE in the last 168 hours. No results for input(s): AMMONIA in the last 168 hours.  CBC: Recent Labs  Lab 05/22/19 0326 05/23/19 0405  05/24/19 0406  05/25/19 0331 05/25/19 0522 05/25/19 2135 2019-06-18 0447 June 18, 2019 0500 June 18, 2019 0805  WBC 9.2 6.3  --  8.4  --  6.5  --   --   --  10.5  --   NEUTROABS 7.2 4.6  --  6.0  --   --   --   --   --   --   --   HGB 10.2* 10.0*   < > 10.2*   < > 9.8* 9.9* 10.2* 10.5* 10.5* 10.9*  HCT 30.0* 29.4*   < > 30.9*   < > 31.3* 29.0* 30.0* 31.0* 32.3* 32.0*  MCV 103.4* 104.3*  --  105.1*  --  109.1*  --   --   --  105.9*  --   PLT 96* 84*  --  99*  --  91*  --   --   --  153  --    < > = values in this interval not displayed.    Cardiac Enzymes: No results  for input(s): CKTOTAL, CKMB, CKMBINDEX, TROPONINI in the last 168 hours.  BNP: BNP (last 3 results) No results for input(s): BNP in the last 8760 hours.  ProBNP (last 3 results) No results for input(s): PROBNP in the last 8760 hours.    Other results:  Imaging: Dg Chest Port 1 View  Result Date: 06/18/19 CLINICAL DATA:  Respiratory failure. EXAM: PORTABLE CHEST 1 VIEW COMPARISON:  05/25/2019 FINDINGS: Stable cardiac enlargement with evidence of prior CABG and aortic valve replacement. Endotracheal tube remains with the tip approximately 3.5 cm above the carina. Stable left jugular central venous catheter positioning with the tip in the SVC. Lungs demonstrate some increase in patchy airspace opacity in the upper lung zones likely related to pulmonary edema/CHF. Increase in bilateral lower lobe atelectasis and probable component of bilateral pleural effusions. IMPRESSION: Increase in pulmonary edema and bilateral lower lobe atelectasis. Probable component of bilateral pleural effusions. Electronically Signed   By: Aletta Edouard M.D.   On: 06/18/2019 08:07   Dg Chest Port 1 View  Result Date: 05/25/2019 CLINICAL DATA:  Intubated patient. Respiratory distress. Follow-up exam. EXAM: PORTABLE CHEST 1 VIEW COMPARISON:  05/24/2019 and earlier exams. FINDINGS: Improved bilateral airspace and interstitial lung opacities. No new lung abnormalities. Endotracheal tube, left internal jugular central venous line and enteric tube are stable and well positioned. No pneumothorax. IMPRESSION: 1. Interval improvement in the lung opacities  consistent with improved pulmonary edema. Probable persistent small effusions. 2. Stable well-positioned support apparatus. Electronically Signed   By: Lajean Manes M.D.   On: 05/25/2019 07:35     Medications:     Scheduled Medications: . aspirin  81 mg Per Tube Daily  . atorvastatin  10 mg Per Tube q1800  . chlorhexidine gluconate (MEDLINE KIT)  15 mL Mouth Rinse  BID  . Chlorhexidine Gluconate Cloth  6 each Topical Daily  . feeding supplement (PRO-STAT SUGAR FREE 64)  30 mL Per Tube TID  . furosemide  80 mg Intravenous Q8H  . insulin aspart  0-20 Units Subcutaneous Q4H  . mouth rinse  15 mL Mouth Rinse 10 times per day  . pantoprazole (PROTONIX) IV  40 mg Intravenous Daily  . QUEtiapine  25 mg Per Tube BID  . sodium chloride flush  10-40 mL Intracatheter Q12H    Infusions: . sodium chloride 10 mL/hr at 05/25/19 1700  . sodium chloride Stopped (2019/06/03 0744)  . amiodarone 60 mg/hr (2019-06-03 0800)  . ampicillin (OMNIPEN) IV 300 mL/hr at 06/03/2019 0800  . cefTRIAXone (ROCEPHIN)  IV Stopped (05/25/19 2207)  . feeding supplement (VITAL AF 1.2 CAL) 1,000 mL (Jun 03, 2019 0516)  . fentaNYL infusion INTRAVENOUS 25 mcg/hr (June 03, 2019 0800)  . heparin 1,350 Units/hr (06-03-2019 0800)  . milrinone 0.125 mcg/kg/min (2019-06-03 0800)  . norepinephrine (LEVOPHED) Adult infusion Stopped (Jun 03, 2019 0241)  . phenylephrine (NEO-SYNEPHRINE) Adult infusion 180 mcg/min (06/03/2019 0800)    PRN Medications: sodium chloride, acetaminophen, acetaminophen, fentaNYL, influenza vaccine adjuvanted, sennosides, sodium chloride flush   Assessment/Plan:   1. Shock - Suspect combination of cardiogenic and septic shock - EF newly down 55-60% in 7/20 -> 25% now. Unclear etiology. Trop moderately up but flat. ? AF vs septic cardiomyopathy.  - Initial swan numbers with low output and co-ox 44% - Initial Bcx 2/2 Enterococcus faecalis. F/u cx negative. - ABx switched back to amp/ceftiaxone on 9/18 - TEE 9/16 EF 25%  Moderate prosthetic AS/severe MS. No vegetation  - Off NE. On Milrinone 0.125 and Neo 100 co-ox 66%. Will attempt to wean Neo given low EF.  - Volume status much improved with IV lasix however CXR worse. Will continue IV diuresis today to facilitate attempts at extubation.  2. Acute systolic HF - EF 37% with severe RV dysfunction, see discussion above - Off NE. On  Milrinone 0.125 and Neo 100 co-ox 66%. Will attempt to wean Neo given low EF.  - Volume status much improved with IV lasix however CXR worse. Will continue IV diuresis today to facilitate attempts at extubation. - Follow CVP and co-ox  3. New onset paroxysmal AF with RVR  - He was went back into AF yesterday but back to NSR this am - Continue  IV amiodarone.  - Continue heparin gtt. -> switch to ELiquis eventually  4. 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 Recommend 6 weeks IV abx - Doubt he would be a surgical candidate.   5. Neuro - acute metabolic encephalopathy - He is now following commands.  - MRI with diffuse infarcts - ? Septic vs plaque => most likely not septic per neuro. He is on heparin gtt.  - TEE 9/16 with no  clear vegetation. Very heavy aortic plaque in descending aorta on TEE  6. Acute hypoxic respiratory failure - Intubated 9/16. CCM following.   - CXR with pulmonary edema - Continue diuresis - CCM following  7. CAD s/p CABG - trop up but doubt ACS as it is flat - likely no coronary angio this admit with strokes and other issues  8. Valvular disease  - TEE suggests severe MS. Likely due to calcifications no clear vegetation or thrombosis. Not surgical or valvuloplasty candidate currently - Aortic stenosis (s/p SAVR 2006, s/p TAVR 2016) gradients suggestive of moderate bioprosthetic AS  CRITICAL CARE Performed by: Glori Bickers  Total critical care time: 35 minutes  Critical care time was exclusive of separately billable procedures and treating other patients.  Critical care was necessary to treat or prevent imminent or life-threatening deterioration.  Critical care was time spent personally by me on the  following activities: development of treatment plan with patient and/or surrogate as well as nursing, discussions with consultants, evaluation of patient's response to treatment, examination of patient, obtaining history from patient or surrogate, ordering and performing treatments and interventions, ordering and review of laboratory studies, ordering and review of radiographic studies, pulse oximetry and re-evaluation of patient's condition.   Glori Bickers, MD  8:42 AM  13-Jun-2019

## 2019-06-05 NOTE — Progress Notes (Signed)
[facility] for Infectious Disease  Date of Admission:  05/29/2019     Total days of antibiotics 10         ASSESSMENT:  Mr. Justin Valencia has had intermittent fever within the pat 24 hours following broadening of antibiotic therapy to ceftriaxone and ampicillin from Penicillin G with CCM team concerns for endocarditis and overall worsening symptoms as he continues to require vasopressor and ventilatory support.  Will continue ampicillin and ceftriaxone. Goal remains 4 weeks of therapy at the present time pending any clinical changes.   PLAN:  1. Continue ampicillin and ceftriaxone. 2. Acute respiratory failure with hypoxia per CCM.   Principal Problem:   Bacteremia due to Enterococcus Active Problems:   Cardiogenic shock (HCC)   Cerebral embolism with cerebral infarction   Acute respiratory failure with hypoxia (HCC)   . aspirin  81 mg Per Tube Daily  . atorvastatin  10 mg Per Tube q1800  . chlorhexidine gluconate (MEDLINE KIT)  15 mL Mouth Rinse BID  . Chlorhexidine Gluconate Cloth  6 each Topical Daily  . feeding supplement (PRO-STAT SUGAR FREE 64)  30 mL Per Tube TID  . furosemide  80 mg Intravenous Q8H  . insulin aspart  0-20 Units Subcutaneous Q4H  . insulin aspart  6 Units Subcutaneous Q4H  . mouth rinse  15 mL Mouth Rinse 10 times per day  . pantoprazole (PROTONIX) IV  40 mg Intravenous Daily  . potassium chloride  20 mEq Per Tube Daily  . QUEtiapine  25 mg Per Tube BID  . sodium chloride flush  10-40 mL Intracatheter Q12H    SUBJECTIVE:  Mildly elevated intermittent temperatures over the past 48 hours. Alert and follows commands.   Allergies  Allergen Reactions  . Sulfamethoxazole Rash    Reported by [facility] in 2016     Review of Systems: Review of Systems  Unable to perform ROS: Intubated    OBJECTIVE: Vitals:   06/15/2019 0730 June 15, 2019 0745 06/15/2019 0800 06-15-19 0805  BP:   122/61 (!) 135/46  Pulse: (!) 58 (!) 59 66 67  Resp: 20 16  (!) 24 (!) 25  Temp:   99 F (37.2 C)   TempSrc:   Oral   SpO2: 93% 94% 99% 92%  Weight:      Height:       Body mass index is 29.98 kg/m.  Physical Exam Constitutional:      General: He is not in acute distress.    Appearance: He is well-developed. He is ill-appearing.  Cardiovascular:     Rate and Rhythm: Normal rate and regular rhythm.     Heart sounds: Murmur present.     Comments: Central line in left internal jugular with dressing is clean and dry and without evidence of infection Pulmonary:     Effort: Pulmonary effort is normal.     Breath sounds: Normal breath sounds. No wheezing or rhonchi.  Abdominal:     General: Bowel sounds are normal.     Tenderness: There is no abdominal tenderness. There is no guarding or rebound.  Skin:    General: Skin is warm and dry.  Neurological:     Mental Status: He is alert.     Lab Results Lab Results  Component Value Date   WBC 10.5 06/15/2019   HGB 10.9 (L) 15-Jun-2019   HCT 32.0 (L) 06-15-19   MCV 105.9 (H) 2019-06-15   PLT 153 15-Jun-2019    Lab Results  Component Value  Date   CREATININE 1.58 (H) 2019/06/14   BUN 52 (H) 2019-06-14   NA 147 (H) 06/14/2019   K 3.6 06/14/19   CL 105 June 14, 2019   CO2 28 2019-06-14    Lab Results  Component Value Date   ALT 57 (H) 05/20/2019   AST 58 (H) 05/20/2019   ALKPHOS 64 05/20/2019   BILITOT 1.0 05/20/2019     Microbiology: Recent Results (from the past 240 hour(s))  SARS Coronavirus 2 [facility] order, Performed in [facility] hospital lab) Nasopharyngeal Nasopharyngeal Swab     Status: None   Collection Time: 05/23/2019  8:20 PM   Specimen: Nasopharyngeal Swab  Result Value Ref Range Status   SARS Coronavirus 2 NEGATIVE NEGATIVE Final    Comment: (NOTE) If result is NEGATIVE SARS-CoV-2 target nucleic acids are NOT DETECTED. The SARS-CoV-2 RNA is generally detectable in upper and lower  respiratory specimens during the acute phase of infection. The lowest   concentration of SARS-CoV-2 viral copies this assay can detect is 250  copies / mL. A negative result does not preclude SARS-CoV-2 infection  and should not be used as the sole basis for treatment or other  patient management decisions.  A negative result may occur with  improper specimen collection / handling, submission of specimen other  than nasopharyngeal swab, presence of viral mutation(s) within the  areas targeted by this assay, and inadequate number of viral copies  (<250 copies / mL). A negative result must be combined with clinical  observations, patient history, and epidemiological information. If result is POSITIVE SARS-CoV-2 target nucleic acids are DETECTED. The SARS-CoV-2 RNA is generally detectable in upper and lower  respiratory specimens dur ing the acute phase of infection.  Positive  results are indicative of active infection with SARS-CoV-2.  Clinical  correlation with patient history and other diagnostic information is  necessary to determine patient infection status.  Positive results do  not rule out bacterial infection or co-infection with other viruses. If result is PRESUMPTIVE POSTIVE SARS-CoV-2 nucleic acids MAY BE PRESENT.   A presumptive positive result was obtained on the submitted specimen  and confirmed on repeat testing.  While 2019 novel coronavirus  (SARS-CoV-2) nucleic acids may be present in the submitted sample  additional confirmatory testing may be necessary for epidemiological  and / or clinical management purposes  to differentiate between  SARS-CoV-2 and other Sarbecovirus currently known to infect humans.  If clinically indicated additional testing with an alternate test  methodology [phone]) is advised. The SARS-CoV-2 RNA is generally  detectable in upper and lower respiratory sp ecimens during the acute  phase of infection. The expected result is Negative. Fact Sheet for Patients:  StrictlyIdeas.no Fact Sheet  for Healthcare Providers: BankingDealers.co.za This test is not yet approved or cleared by the [place] FDA and has been authorized for detection and/or diagnosis of SARS-CoV-2 by FDA under an Emergency Use Authorization (EUA).  This EUA will remain in effect (meaning this test can be used) for the duration of the COVID-19 declaration under Section 564(b)(1) of the Act, 21 U.S.C. section 360bbb-3(b)(1), unless the authorization is terminated or revoked sooner. Performed at [facility], [place] [address] [place], [place] [address]   Respiratory Panel by PCR     Status: None   Collection Time: 05/17/19  2:03 AM   Specimen: Respiratory  Result Value Ref Range Status   Adenovirus NOT DETECTED NOT DETECTED Final   Coronavirus 229E NOT DETECTED NOT DETECTED Final  Comment: (NOTE) The Coronavirus on the Respiratory Panel, DOES NOT test for the novel  Coronavirus (2019 nCoV)    Coronavirus HKU1 NOT DETECTED NOT DETECTED Final   Coronavirus NL63 NOT DETECTED NOT DETECTED Final   Coronavirus OC43 NOT DETECTED NOT DETECTED Final   Metapneumovirus NOT DETECTED NOT DETECTED Final   Rhinovirus / Enterovirus NOT DETECTED NOT DETECTED Final   Influenza A NOT DETECTED NOT DETECTED Final   Influenza B NOT DETECTED NOT DETECTED Final   Parainfluenza Virus 1 NOT DETECTED NOT DETECTED Final   Parainfluenza Virus 2 NOT DETECTED NOT DETECTED Final   Parainfluenza Virus 3 NOT DETECTED NOT DETECTED Final   Parainfluenza Virus 4 NOT DETECTED NOT DETECTED Final   Respiratory Syncytial Virus NOT DETECTED NOT DETECTED Final   Bordetella pertussis NOT DETECTED NOT DETECTED Final   Chlamydophila pneumoniae NOT DETECTED NOT DETECTED Final   Mycoplasma pneumoniae NOT DETECTED NOT DETECTED Final    Comment: Performed at [facility], [place] [address], [place], [place] [address]  MRSA PCR Screening     Status: None   Collection Time: 05/17/19  2:03 AM   Specimen:  Nasopharyngeal  Result Value Ref Range Status   MRSA by PCR NEGATIVE NEGATIVE Final    Comment:        The GeneXpert MRSA Assay (FDA approved for NASAL specimens only), is one component of a comprehensive MRSA colonization surveillance program. It is not intended to diagnose MRSA infection nor to guide or monitor treatment for MRSA infections. Performed at [facility], [place] [address], [place], [place] [address]   Culture, blood (routine x 2)     Status: Abnormal   Collection Time: 05/17/19  2:40 AM   Specimen: BLOOD LEFT HAND  Result Value Ref Range Status   Specimen Description BLOOD LEFT HAND  Final   Special Requests   Final    BOTTLES DRAWN AEROBIC ONLY Blood Culture results may not be optimal due to an inadequate volume of blood received in culture bottles   Culture  Setup Time   Final    GRAM POSITIVE COCCI IN PAIRS AND CHAINS AEROBIC BOTTLE ONLY CRITICAL RESULT CALLED TO, READ BACK BY AND VERIFIED WITH: A. MEYER, PHARMD AT 1935 ON 05/17/19 BY C. JESSUP, MT.    Culture (A)  Final    ENTEROCOCCUS FAECALIS SUSCEPTIBILITIES PERFORMED ON PREVIOUS CULTURE WITHIN THE LAST 5 DAYS. Performed at [facility], [place] [address], [place], [place] [address]    Report Status 05/19/2019 FINAL  Final  Culture, blood (routine x 2)     Status: Abnormal   Collection Time: 05/17/19  2:44 AM   Specimen: BLOOD RIGHT HAND  Result Value Ref Range Status   Specimen Description BLOOD RIGHT HAND  Final   Special Requests   Final    BOTTLES DRAWN AEROBIC ONLY Blood Culture results may not be optimal due to an inadequate volume of blood received in culture bottles   Culture  Setup Time   Final    GRAM POSITIVE COCCI IN PAIRS AND CHAINS AEROBIC BOTTLE ONLY CRITICAL RESULT CALLED TO, READ BACK BY AND VERIFIED WITH: A. MEYER, PHARMD AT 1935 ON 05/17/19 BY C. JESSUP, MT. Performed at [facility], [place] [address], [place],  [address]    Culture ENTEROCOCCUS FAECALIS (A)  Final    Report Status 05/19/2019 FINAL  Final   Organism ID, Bacteria ENTEROCOCCUS FAECALIS  Final      Susceptibility   Enterococcus  faecalis - MIC*    AMPICILLIN <=2 SENSITIVE Sensitive     VANCOMYCIN 2 SENSITIVE Sensitive     GENTAMICIN SYNERGY SENSITIVE Sensitive     * ENTEROCOCCUS FAECALIS  Blood Culture ID Panel (Reflexed)     Status: Abnormal   Collection Time: 05/17/19  2:44 AM  Result Value Ref Range Status   Enterococcus species DETECTED (A) NOT DETECTED Final    Comment: CRITICAL RESULT CALLED TO, READ BACK BY AND VERIFIED WITH: A. MEYER, PHARMD AT 1935 ON 05/17/19 BY C. JESSUP, MT.    Vancomycin resistance NOT DETECTED NOT DETECTED Final   Listeria monocytogenes NOT DETECTED NOT DETECTED Final   Staphylococcus species NOT DETECTED NOT DETECTED Final   Staphylococcus aureus (BCID) NOT DETECTED NOT DETECTED Final   Streptococcus species NOT DETECTED NOT DETECTED Final   Streptococcus agalactiae NOT DETECTED NOT DETECTED Final   Streptococcus pneumoniae NOT DETECTED NOT DETECTED Final   Streptococcus pyogenes NOT DETECTED NOT DETECTED Final   Acinetobacter baumannii NOT DETECTED NOT DETECTED Final   Enterobacteriaceae species NOT DETECTED NOT DETECTED Final   Enterobacter cloacae complex NOT DETECTED NOT DETECTED Final   Escherichia coli NOT DETECTED NOT DETECTED Final   Klebsiella oxytoca NOT DETECTED NOT DETECTED Final   Klebsiella pneumoniae NOT DETECTED NOT DETECTED Final   Proteus species NOT DETECTED NOT DETECTED Final   Serratia marcescens NOT DETECTED NOT DETECTED Final   Haemophilus influenzae NOT DETECTED NOT DETECTED Final   Neisseria meningitidis NOT DETECTED NOT DETECTED Final   Pseudomonas aeruginosa NOT DETECTED NOT DETECTED Final   Candida albicans NOT DETECTED NOT DETECTED Final   Candida glabrata NOT DETECTED NOT DETECTED Final   Candida krusei NOT DETECTED NOT DETECTED Final   Candida parapsilosis NOT DETECTED NOT DETECTED Final   Candida tropicalis NOT  DETECTED NOT DETECTED Final    Comment: Performed at [facility], [address] [place], [place] [address]  Culture, Urine     Status: None   Collection Time: 05/17/19  3:37 AM   Specimen: Urine, Catheterized  Result Value Ref Range Status   Specimen Description URINE, CATHETERIZED  Final   Special Requests NONE  Final   Culture   Final    NO GROWTH Performed at [facility] [address], [place], [place] [address]    Report Status 05/18/2019 FINAL  Final  Culture, blood (routine x 2)     Status: None   Collection Time: 05/18/19  4:08 PM   Specimen: BLOOD  Result Value Ref Range Status   Specimen Description BLOOD BLOOD RIGHT WRIST  Final   Special Requests   Final    AEROBIC BOTTLE ONLY Blood Culture results may not be optimal due to an inadequate volume of blood received in culture bottles   Culture   Final    NO GROWTH 5 DAYS Performed at [facility], [place] [address] [place], [place] [address]    Report Status 05/23/2019 FINAL  Final  Culture, blood (routine x 2)     Status: None   Collection Time: 05/18/19  4:08 PM   Specimen: BLOOD  Result Value Ref Range Status   Specimen Description BLOOD BLOOD RIGHT HAND  Final   Special Requests AEROBIC BOTTLE ONLY Blood Culture adequate volume  Final   Culture   Final    NO GROWTH 5 DAYS Performed at [facility], [place] [address], [place], [place] [address]    Report Status 05/23/2019 FINAL  Final  Culture, blood (routine x 2)     Status: None (Preliminary result)   Collection Time: 05/23/19  2:37 PM   Specimen: BLOOD LEFT HAND  Result Value Ref Range Status   Specimen Description BLOOD LEFT HAND  Final   Special Requests   Final    BOTTLES DRAWN AEROBIC ONLY Blood Culture adequate volume   Culture   Final    NO GROWTH 3 DAYS Performed at [facility], [address] [place], [place] [address]    Report Status PENDING  Incomplete  Culture, blood (routine x 2)     Status: None (Preliminary  result)   Collection Time: 05/23/19  2:37 PM   Specimen: BLOOD RIGHT HAND  Result Value Ref Range Status   Specimen Description BLOOD RIGHT HAND  Final   Special Requests   Final    BOTTLES DRAWN AEROBIC ONLY Blood Culture adequate volume   Culture   Final    NO GROWTH 3 DAYS Performed at [facility], [place] [address], [place], [place] [address]    Report Status PENDING  Incomplete     Terri Piedra, [place] [phone] Pager  06-09-2019  10:01 AM

## 2019-06-05 NOTE — Progress Notes (Signed)
Chaplain responded to Code Blue.  The chaplain provided support for the family during the process of resuscitation and following the death of the patient.  Brion Aliment Chaplain Resident For questions concerning this note please contact me by pager [phone]

## 2019-06-05 NOTE — Progress Notes (Signed)
ANTICOAGULATION CONSULT NOTE - Follow Up Consult  Pharmacy Consult for Heparin Indication: atrial fibrillation  Allergies  Allergen Reactions  . Sulfamethoxazole Rash    Reported by [facility] in 2016    Patient Measurements: Height: 5\' 11"  (180.3 cm) Weight: 214 lb 15.2 oz (97.5 kg) IBW/kg (Calculated) : 75.3  Vital Signs: Temp: 98.1 F (36.7 C) (09/21 0400) Temp Source: Oral (09/21 0400) BP: 106/78 (09/21 0400) Pulse Rate: 89 (09/21 0400)  Heparin dosing weight: 94.4 kg  Labs: Recent Labs    05/24/19 0406  05/24/19 1800 05/25/19 0331 05/25/19 0405  05/25/19 2135 05/25/19 2233 June 15, 2019 0447 15-Jun-2019 0500  HGB 10.2*   < >  --  9.8*  --    < > 10.2*  --  10.5* 10.5*  HCT 30.9*   < >  --  31.3*  --    < > 30.0*  --  31.0* 32.3*  PLT 99*  --   --  91*  --   --   --   --   --  153  HEPARINUNFRC  --    < > 0.47  --  0.41  --   --   --   --  0.36  CREATININE 1.36*  --   --  1.32*  --   --   --  1.49*  --  1.58*   < > = values in this interval not displayed.    Estimated Creatinine Clearance: 48.1 mL/min (A) (by C-G formula based on SCr of 1.58 mg/dL (H)).  Assessment: 78 y.o. male admitted on 9/11 with weakness and fall at home. On admit, he was dyspnic, hypotensive, and found to have new Afib with no anticoagulation PTA. Pt also found to have EF of 20-25% on ECHO. He developed septic shock due to enterococcus bacteremia. TEE was negative for vegetations. Heparin was initially started but held due to risk of hemorrhage from septic embolism. Due to a lack of vegetations seen, pharmacy consulted to restart heparin. 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   Heparin level is 0.36 (therapeutic) on heparin rate of 1,350 units/hr. No bleeding issues or problems with infusion noted. Hgb and plts are stable.  Goal of Therapy:  Heparin level 0.3-0.5 units/ml Monitor platelets by anticoagulation protocol: Yes   Plan:   Continue heparin infusion 1350 units/hr Daily heparin level and CBC Watch for signs/symptoms of bleeding  Sherren Kerns, PharmD PGY1 Acute Care Pharmacy Resident [phone] 2019/06/15 6:42 AM

## 2019-06-05 NOTE — Progress Notes (Signed)
1200 Pt went into vtach arrest with RN at bedside. Arterial line without pulsation. CPR started immediately.Wife at bedside and updated on current situation. MDs called and present. See code documentation for more details.   TOD pronounced by Dr. Haroldine Laws at 1224 pm.

## 2019-06-05 NOTE — Progress Notes (Signed)
Inpatient Diabetes Program Recommendations  AACE/ADA: New Consensus Statement on Inpatient Glycemic Control   Target Ranges:  Prepandial:   less than 140 mg/dL      Peak postprandial:   less than 180 mg/dL (1-2 hours)      Critically ill patients:  140 - 180 mg/dL   Results for JOHNELL, BAS (MRN [id number]) as of June 13, 2019 09:52  Ref. Range 05/25/2019 03:26 05/25/2019 07:53 05/25/2019 11:14 05/25/2019 16:06 05/25/2019 20:24 05/25/2019 23:33 June 13, 2019 04:45  Glucose-Capillary Latest Ref Range: 70 - 99 mg/dL 211 (H)  Novolog 7 units 196 (H)  Novolog 4 units 211 (H)  Novolog 7 units 246 (H)  Novolog 7 units 216 (H)  Novolog 7 units 237 (H)  Novolog 7 units 228 (H)  Novolog 7 units  Results for FUTURE, YELDELL (MRN [id number]) as of Jun 13, 2019 09:52  Ref. Range 05/24/2019 08:26 05/24/2019 12:03 05/24/2019 16:34 05/24/2019 20:55 05/24/2019 23:13  Glucose-Capillary Latest Ref Range: 70 - 99 mg/dL 248 (H)  Novolog 7 units 211 (H)  Novolog 7 units 179 (H)  Novolog 4 units 201 (H)  Novolog 7 units 185 (H)  Novolog 4 units   Review of Glycemic Control  Diabetes history: DM2 Outpatient Diabetes medications: Glipizide XL 10 mg QAM Current orders for Inpatient glycemic control: Novolog 0-20 units Q4H, Novolog 3 units Q4H; Vital @ 55 ml/hr  Inpatient Diabetes Program Recommendations:   Insulin-Tube Feeding Coverage:  Please consider increasing tube feeding coverage to Novolog 6 units Q4H. If tube feeding is stopped or held then Novolog tube feeding coverage should also be stopped or held.   Thanks, Barnie Alderman, RN, MSN, CDE Diabetes Coordinator Inpatient Diabetes Program [phone] (Team Pager from 8am to 5pm)

## 2019-06-05 DEATH — deceased

## 2019-06-12 ENCOUNTER — Inpatient Hospital Stay: Payer: Medicare HMO | Admitting: Infectious Diseases

## 2019-06-16 ENCOUNTER — Telehealth (HOSPITAL_COMMUNITY): Payer: Self-pay

## 2019-06-16 NOTE — Telephone Encounter (Signed)
Received original death certificate, filled out by Dr. Haroldine Laws and mailed directly to the health department in prepaid/ preprinted envelope. This RN placed it in the front office to be mailed.
